# Patient Record
Sex: Male | Born: 1935 | Race: White | Hispanic: No | Marital: Married | Smoking: Former smoker
Health system: Southern US, Community
[De-identification: ages and names within clinical notes are randomized; demographics above are authoritative.]

## PROBLEM LIST (undated history)

## (undated) DIAGNOSIS — Z923 Personal history of irradiation: Secondary | ICD-10-CM

## (undated) DIAGNOSIS — C7951 Secondary malignant neoplasm of bone: Secondary | ICD-10-CM

## (undated) DIAGNOSIS — D649 Anemia, unspecified: Secondary | ICD-10-CM

## (undated) DIAGNOSIS — J189 Pneumonia, unspecified organism: Secondary | ICD-10-CM

## (undated) DIAGNOSIS — C801 Malignant (primary) neoplasm, unspecified: Secondary | ICD-10-CM

## (undated) DIAGNOSIS — I10 Essential (primary) hypertension: Secondary | ICD-10-CM

## (undated) DIAGNOSIS — C3490 Malignant neoplasm of unspecified part of unspecified bronchus or lung: Secondary | ICD-10-CM

## (undated) DIAGNOSIS — Z87442 Personal history of urinary calculi: Secondary | ICD-10-CM

## (undated) DIAGNOSIS — R0602 Shortness of breath: Secondary | ICD-10-CM

## (undated) DIAGNOSIS — J019 Acute sinusitis, unspecified: Secondary | ICD-10-CM

## (undated) DIAGNOSIS — I714 Abdominal aortic aneurysm, without rupture, unspecified: Secondary | ICD-10-CM

## (undated) DIAGNOSIS — J449 Chronic obstructive pulmonary disease, unspecified: Secondary | ICD-10-CM

## (undated) DIAGNOSIS — C140 Malignant neoplasm of pharynx, unspecified: Secondary | ICD-10-CM

## (undated) DIAGNOSIS — J40 Bronchitis, not specified as acute or chronic: Secondary | ICD-10-CM

## (undated) DIAGNOSIS — E1110 Type 2 diabetes mellitus with ketoacidosis without coma: Secondary | ICD-10-CM

## (undated) HISTORY — PX: OTHER SURGICAL HISTORY: SHX169

## (undated) HISTORY — PX: BRONCHOSCOPY: SUR163

## (undated) HISTORY — DX: Type 2 diabetes mellitus with ketoacidosis without coma: E11.10

## (undated) HISTORY — DX: Secondary malignant neoplasm of bone: C79.51

## (undated) HISTORY — DX: Abdominal aortic aneurysm, without rupture: I71.4

## (undated) HISTORY — DX: Bronchitis, not specified as acute or chronic: J40

## (undated) HISTORY — PX: COLON SURGERY: SHX602

## (undated) HISTORY — DX: Personal history of irradiation: Z92.3

## (undated) HISTORY — DX: Abdominal aortic aneurysm, without rupture, unspecified: I71.40

## (undated) HISTORY — PX: CATARACT EXTRACTION, BILATERAL: SHX1313

## (undated) HISTORY — PX: SCROTUM EXPLORATION: SHX2389

## (undated) HISTORY — DX: Chronic obstructive pulmonary disease, unspecified: J44.9

---

## 2004-07-12 HISTORY — PX: OTHER SURGICAL HISTORY: SHX169

## 2007-07-24 LAB — PULMONARY FUNCTION TEST

## 2007-07-28 ENCOUNTER — Ambulatory Visit: Payer: Self-pay | Admitting: Cardiology

## 2008-09-05 ENCOUNTER — Ambulatory Visit (HOSPITAL_COMMUNITY): Admission: RE | Admit: 2008-09-05 | Discharge: 2008-09-05 | Payer: Self-pay | Admitting: Ophthalmology

## 2008-10-09 ENCOUNTER — Ambulatory Visit (HOSPITAL_COMMUNITY): Admission: RE | Admit: 2008-10-09 | Discharge: 2008-10-09 | Payer: Self-pay | Admitting: Ophthalmology

## 2008-10-30 ENCOUNTER — Ambulatory Visit (HOSPITAL_COMMUNITY): Admission: RE | Admit: 2008-10-30 | Discharge: 2008-10-30 | Payer: Self-pay | Admitting: Ophthalmology

## 2010-10-21 LAB — GLUCOSE, CAPILLARY: Glucose-Capillary: 153 mg/dL — ABNORMAL HIGH (ref 70–99)

## 2010-10-27 LAB — HEMOGLOBIN AND HEMATOCRIT, BLOOD
HCT: 42.1 % (ref 39.0–52.0)
Hemoglobin: 14.2 g/dL (ref 13.0–17.0)

## 2010-10-27 LAB — BASIC METABOLIC PANEL
CO2: 25 mEq/L (ref 19–32)
Chloride: 99 mEq/L (ref 96–112)
Creatinine, Ser: 0.74 mg/dL (ref 0.4–1.5)
GFR calc Af Amer: >60 mL/min (ref >60)
Potassium: 4.1 mEq/L (ref 3.5–5.1)

## 2012-08-15 ENCOUNTER — Other Ambulatory Visit: Payer: Self-pay | Admitting: Family Medicine

## 2012-08-15 DIAGNOSIS — R091 Pleurisy: Secondary | ICD-10-CM

## 2012-08-18 ENCOUNTER — Encounter (HOSPITAL_COMMUNITY): Payer: Self-pay

## 2012-08-18 ENCOUNTER — Ambulatory Visit (HOSPITAL_COMMUNITY): Payer: Self-pay

## 2012-08-18 ENCOUNTER — Ambulatory Visit (HOSPITAL_COMMUNITY)
Admission: RE | Admit: 2012-08-18 | Discharge: 2012-08-18 | Disposition: A | Discharge status: Home / Self Care | Payer: Medicare Other | Source: Ambulatory Visit | Attending: Family Medicine | Admitting: Family Medicine

## 2012-08-18 DIAGNOSIS — R079 Chest pain, unspecified: Secondary | ICD-10-CM | POA: Insufficient documentation

## 2012-08-18 DIAGNOSIS — R599 Enlarged lymph nodes, unspecified: Secondary | ICD-10-CM | POA: Insufficient documentation

## 2012-08-18 DIAGNOSIS — R091 Pleurisy: Secondary | ICD-10-CM

## 2012-08-18 DIAGNOSIS — J984 Other disorders of lung: Secondary | ICD-10-CM | POA: Insufficient documentation

## 2012-09-04 ENCOUNTER — Other Ambulatory Visit (HOSPITAL_COMMUNITY): Payer: Self-pay | Admitting: Gastroenterology

## 2012-09-04 DIAGNOSIS — C189 Malignant neoplasm of colon, unspecified: Secondary | ICD-10-CM

## 2012-09-05 ENCOUNTER — Ambulatory Visit (HOSPITAL_COMMUNITY)
Admission: RE | Admit: 2012-09-05 | Discharge: 2012-09-05 | Disposition: A | Discharge status: Home / Self Care | Payer: Medicare Other | Source: Ambulatory Visit | Attending: Gastroenterology | Admitting: Gastroenterology

## 2012-09-05 DIAGNOSIS — I714 Abdominal aortic aneurysm, without rupture, unspecified: Secondary | ICD-10-CM | POA: Insufficient documentation

## 2012-09-05 DIAGNOSIS — C189 Malignant neoplasm of colon, unspecified: Secondary | ICD-10-CM | POA: Insufficient documentation

## 2012-09-05 MED ORDER — IOHEXOL 300 MG/ML  SOLN
100.0000 mL | Freq: Once | INTRAMUSCULAR | Status: AC | PRN
  Administered 2012-09-05: 100 mL via INTRAVENOUS

## 2012-09-11 ENCOUNTER — Ambulatory Visit (INDEPENDENT_AMBULATORY_CARE_PROVIDER_SITE_OTHER): Payer: Self-pay | Admitting: General Surgery

## 2012-09-18 ENCOUNTER — Encounter (INDEPENDENT_AMBULATORY_CARE_PROVIDER_SITE_OTHER): Payer: Self-pay | Admitting: General Surgery

## 2012-09-18 ENCOUNTER — Ambulatory Visit (INDEPENDENT_AMBULATORY_CARE_PROVIDER_SITE_OTHER): Payer: Medicare Other | Admitting: General Surgery

## 2012-09-18 VITALS — BP 142/64 | HR 92 | Temp 98.1°F | Resp 16 | Ht 68.0 in | Wt 179.0 lb

## 2012-09-18 DIAGNOSIS — D378 Neoplasm of uncertain behavior of other specified digestive organs: Secondary | ICD-10-CM

## 2012-09-18 DIAGNOSIS — D371 Neoplasm of uncertain behavior of stomach: Secondary | ICD-10-CM

## 2012-09-18 DIAGNOSIS — C189 Malignant neoplasm of colon, unspecified: Secondary | ICD-10-CM | POA: Insufficient documentation

## 2012-09-18 DIAGNOSIS — J449 Chronic obstructive pulmonary disease, unspecified: Secondary | ICD-10-CM

## 2012-09-18 DIAGNOSIS — E1110 Type 2 diabetes mellitus with ketoacidosis without coma: Secondary | ICD-10-CM

## 2012-09-18 DIAGNOSIS — I714 Abdominal aortic aneurysm, without rupture: Secondary | ICD-10-CM

## 2012-09-18 DIAGNOSIS — E1310 Other specified diabetes mellitus with ketoacidosis without coma: Secondary | ICD-10-CM

## 2012-09-18 HISTORY — DX: Type 2 diabetes mellitus with ketoacidosis without coma: E11.10

## 2012-09-18 NOTE — Patient Instructions (Signed)
[facility]  ONE-DAY (1) PRE-OP HOME COLON PREP INSTRUCTIONS: ** MIRALAX / GATORADE PREP **  Fill the two prescriptions at a pharmacy of your choice.  You must follow the instructions below carefully.  If you have questions or problems, please call and speak to someone in the clinic department at our office:   [phone].  MIRALAX - GATORADE -- DULCOLAX TABS:   Fill the prescriptions for MIRALAX  (255 gm bottle)    In addition, purchase four (4) DULCOLAX TABLETS (no prescription required), and one 64 oz GATORADE.  (Do NOT purchase red Gatorade; any other flavor is acceptable).  ANITIBIOTICS:   There will be 2 different antibiotics.     Take both prescriptions THE AFTERNOON BEFORE your surgery, at the times written on the bottles.  INSTRUCTIONS: 1. Five days prior to your procedure do not eat nuts, popcorn, or fruit with seeds.  Stop all fiber supplements such as Metamucil, Citrucel, etc.  2. The day before your procedure: o 6:00am:  take (4) Dulcolax tablets.  You should remain on clear liquids for the entire day.   CLEAR LIQUIDS: clear bouillon, broth, jello (NOT RED), black coffee, tea, soda, etc o 10:00am:  add the bottle of MiraLax to the 64-oz bottle of Gatorade, and dissolve.  Begin drinking the Gatorade mixture until gone (8 oz every 15-30 minutes).  Continue clear liquids until midnight (or bedtime). o Take the antibiotics at the times instructed on the bottles.  3. The day of your procedure:   Do not eat or drink ANYTHING after midnight before your surgery.     If you take Heart or Blood Pressure medicine, ask the pre-op nurses about these during your preop appointment.   Further pre-operative instructions will be given to you from the hospital.   Expect to be contacted 5-7 days before your surgery.       

## 2012-09-18 NOTE — Progress Notes (Signed)
Patient ID: Alan [name], male   DOB: 18-Oct-1931, 79 y.o.   MRN: [id number]  Chief Complaint  Patient presents with  . New Evaluation    eval cecal ca    HPI Alan [name] is a 79 y.o. male.   HPI  He is referred by Dr. Ewing Schlein for further evaluation and treatment of a highly dysplastic cecal neoplasm.  He was noted to have occult blood in his stool. He subsequently underwent a colonoscopy which demonstrated multiple polyps. The descending colon and sigmoid colon polyps were benign adenomatous polyps. The cecal polyp was large and grossly suspicious for malignancy. Biopsy demonstrated high-grade glandular dysplasia. He underwent a CT scan of the abdomen and pelvis and no metastatic disease disease was noted. The cecal tumor was large and noted on CT scan. Some thickening of the descending colon was noted. Also noted was a 6 cm x 5.5 cm infrarenal abdominal aortic aneurysm. A chest CT had been done in early February which demonstrated a left-sided rib fracture (he was having left chest wall pain) as well as some mediastinal adenopathy and stable pulmonary nodules.There is no family history of colon cancer. He denies constipation.  He is here with his wife.  Past Medical History  Diagnosis Date  . DM (diabetes mellitus) type 2 09/18/2012   COPD- on home oxygen at night Acute bronchitis Hyperlipidemia Scrotal abscess Anemia Left rib fracture Pulmonary nodules Sleep apnea BPH   Past Surgical History  Procedure Laterality Date  . Scrotum exploration    Drainage of peritonsillar abscess  History reviewed. No pertinent family history.  Social History History  Substance Use Topics  . Smoking status: Former Smoker    Types: Cigarettes  . Smokeless tobacco: Never Used  . Alcohol Use: No    Allergies  Allergen Reactions  . Sulfa Antibiotics   . Amoxicillin Rash    Current Outpatient Prescriptions  Medication Sig Dispense Refill  . albuterol (PROVENTIL) (2.5 MG/3ML) 0.083%  nebulizer solution       . amoxicillin (AMOXIL) 875 MG tablet       . atorvastatin (LIPITOR) 20 MG tablet       . fluticasone (FLONASE) 50 MCG/ACT nasal spray       . lisinopril (PRINIVIL,ZESTRIL) 5 MG tablet       . metFORMIN (GLUCOPHAGE) 500 MG tablet       . ONE TOUCH ULTRA TEST test strip       . SPIRIVA HANDIHALER 18 MCG inhalation capsule       . SYMBICORT 160-4.5 MCG/ACT inhaler       . tamsulosin (FLOMAX) 0.4 MG CAPS        No current facility-administered medications for this visit.    Review of Systems Review of Systems  Constitutional:       Weight loss.  Respiratory: Positive for cough and shortness of breath (with activity).   Cardiovascular: Negative.   Gastrointestinal: Negative.   Genitourinary: Negative.   Hematological: Negative.     Blood pressure 142/64, pulse 92, temperature 98.1 F (36.7 C), temperature source Temporal, resp. rate 16, height 5\' 8"  (1.727 m), weight 179 lb (81.194 kg).  Physical Exam Physical Exam  Constitutional:  Overweight male.  HENT:  Head: Normocephalic and atraumatic.  Eyes: EOM are normal. No scleral icterus.  Neck: Neck supple.  Cardiovascular: Normal rate and regular rhythm.   Pulmonary/Chest:  Very distant breath sounds. No wheezing.  Abdominal: Soft. He exhibits no distension and no mass. There is no tenderness.  Musculoskeletal: He exhibits no edema.  Lymphadenopathy:    He has no cervical adenopathy.  Neurological: He is alert.  Skin: Skin is warm and dry. Rash noted. Erythema: Papular rashon the trunk and upper arms.  Psychiatric: He has a normal mood and affect. His behavior is normal.    Data Reviewed Colonoscopy report. Pathology report. Notes from Dr. Ewing Schlein.  CT scans and reports.  Assessment    1. 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 This is causing occult blood loss and anemia.  2. 6 cm x 5.5 cm abdominal aortic aneurysm that is a new diagnosis.  This is been discussed with  one of the vascular surgeons-Dr. Myra Gianotti.  3. COPD and sleep apnea-uses oxygen at night. Limitation of activity due to the COPD. He is able to climb 14 steps without significant shortness of breath.  4. Type 2 diabetes mellitus. Well-controlled.  5.  New rash. Appears to be a drug-induced rash possibly secondary to amoxicillin. I have told him to stop the amoxicillin and call Dr. Modesto Charon about this.    Plan    Given the fact that this tumor is occultly bleeding causing anemia I recommended proceeding with a laparoscopic assisted partial colectomy. Once he gets over this, he may need attention to the abdominal aortic aneurysm. I am going to make a referral to Dr. Myra Gianotti for further evaluation and potential treatment of this in the future. I explained to him that the rupture rate of this aneurysm was 10-15% over the next year.  I have explained the procedure and risks of colon resection.  Risks include but are not limited to bleeding, infection, wound problems, anesthesia, anastomotic leak, need for colostomy, injury to intraabominal organs (such as intestine, spleen, kidney, bladder, ureter, etc.), ileus, irregular bowel habits. The big issue could be with his lungs and he understands this. They seem to understand and agree to proceed.       Thanos Cousineau J 09/18/2012, 3:43 PM

## 2012-09-19 ENCOUNTER — Encounter (INDEPENDENT_AMBULATORY_CARE_PROVIDER_SITE_OTHER): Payer: Self-pay

## 2012-09-20 ENCOUNTER — Telehealth (INDEPENDENT_AMBULATORY_CARE_PROVIDER_SITE_OTHER): Payer: Self-pay | Admitting: General Surgery

## 2012-09-20 NOTE — Telephone Encounter (Signed)
Alan [name], as [facility], called to let Dr. Abbey Chatters know the referral sent has been appointed to Dr. Myra Gianotti on Friday, September 22, 2012, at 10:00 am.  She has left message for the pt, but has not made direct contact with him.  There is no other person or phone number in his [facility] chart to provide to reach him.  She will continue to try to reach him or his wife.

## 2012-09-20 NOTE — Telephone Encounter (Signed)
Juliette Alcide called back to acknowledge speaking to the pt to confirm his appt with [facility] on Friday.

## 2012-09-21 ENCOUNTER — Encounter: Payer: Self-pay | Admitting: Vascular Surgery

## 2012-09-22 ENCOUNTER — Ambulatory Visit (INDEPENDENT_AMBULATORY_CARE_PROVIDER_SITE_OTHER): Payer: Medicare Other | Admitting: Surgery

## 2012-09-22 ENCOUNTER — Encounter: Payer: Self-pay | Admitting: Surgery

## 2012-09-22 VITALS — BP 155/79 | HR 81 | Resp 18 | Ht 68.0 in | Wt 178.2 lb

## 2012-09-22 DIAGNOSIS — I714 Abdominal aortic aneurysm, without rupture, unspecified: Secondary | ICD-10-CM | POA: Insufficient documentation

## 2012-09-22 DIAGNOSIS — I739 Peripheral vascular disease, unspecified: Secondary | ICD-10-CM

## 2012-09-22 DIAGNOSIS — Z01810 Encounter for preprocedural cardiovascular examination: Secondary | ICD-10-CM

## 2012-09-22 NOTE — Addendum Note (Signed)
Addended by: Sharee Pimple on: 09/22/2012 11:15 AM   Modules accepted: Orders

## 2012-09-22 NOTE — Progress Notes (Signed)
[facility]   Patient name: BOWEN KIA MRN: [id number] DOB: 03-11-1932 Sex: male   Referred by: Dr. Abbey Chatters  Reason for referral:  Chief Complaint  Patient presents with  . New Evaluation    referred by Dr. Avel Peace  . AAA    HISTORY OF PRESENT ILLNESS: This is a very pleasant 79 year old gentleman that I had seen for evaluation of an abdominal aortic aneurysm. His aneurysm was recently detected during a workup for heme positive stools. He was diagnosed with a cecal mass. A CT scan showed a 6 cm abdominal aortic aneurysm. The patient's most recent hemoglobin was 9. The patient was recently diagnosed with diabetes back in November. He does not know what his A1c is. He has a history of smoking but does not smoke currently. He is treated for his COPD with a Spiriva as well as home oxygen. The patient states that he has cholesterol issues but is not currently taking a statin.  Past Medical History  Diagnosis Date  . DM (diabetes mellitus) type 2 09/18/2012  . AAA (abdominal aortic aneurysm)   . COPD (chronic obstructive pulmonary disease)   . Bronchitis     Past Surgical History  Procedure Laterality Date  . Scrotum exploration    . Removal of abcess  2006    pt. states abcess was located behind his tonsils    History   Social History  . Marital Status: Married    Spouse Name: N/A    Number of Children: N/A  . Years of Education: N/A   Occupational History  . Not on file.   Social History Main Topics  . Smoking status: Former Smoker    Types: Cigarettes    Quit date: 07/12/2001  . Smokeless tobacco: Never Used  . Alcohol Use: No  . Drug Use: No  . Sexually Active: Not on file   Other Topics Concern  . Not on file   Social History Narrative  . No narrative on file    Family History  Problem Relation Age of Onset  . Heart disease Father     Allergies as of 09/22/2012 - Review Complete 09/22/2012  Allergen  Reaction Noted  . Sulfa antibiotics  09/05/2012  . Amoxicillin Rash 09/18/2012    Current Outpatient Prescriptions on File Prior to Visit  Medication Sig Dispense Refill  . albuterol (PROVENTIL) (2.5 MG/3ML) 0.083% nebulizer solution       . atorvastatin (LIPITOR) 20 MG tablet       . fluticasone (FLONASE) 50 MCG/ACT nasal spray       . lisinopril (PRINIVIL,ZESTRIL) 5 MG tablet       . metFORMIN (GLUCOPHAGE) 500 MG tablet       . ONE TOUCH ULTRA TEST test strip       . SPIRIVA HANDIHALER 18 MCG inhalation capsule       . SYMBICORT 160-4.5 MCG/ACT inhaler       . tamsulosin (FLOMAX) 0.4 MG CAPS       . amoxicillin (AMOXIL) 875 MG tablet        No current facility-administered medications on file prior to visit.     REVIEW OF SYSTEMS: Cardiovascular: No chest pain, chest pressure, palpitations, orthopnea, or dyspnea on exertion. No claudication or rest pain,  No history of DVT or phlebitis. Pulmonary: Positive for home oxygen and shortness of breath with exertion.  Neurologic: No weakness, paresthesias, aphasia, or amaurosis. No dizziness. Hematologic: No bleeding problems or clotting disorders.  Musculoskeletal: No joint pain or joint swelling. Gastrointestinal: No blood in stool or hematemesis Genitourinary: No dysuria or hematuria. Psychiatric:: No history of major depression. Integumentary: No rashes or ulcers. Constitutional: No fever or chills.  PHYSICAL EXAMINATION: General: The patient appears their stated age.  Vital signs are BP 155/79  Pulse 81  Resp 18  Ht 5\' 8"  (1.727 m)  Wt 178 lb 3.2 oz (80.831 kg)  BMI 27.1 kg/m2 HEENT:  No gross abnormalities Pulmonary: Respirations are non-labored Abdomen: Soft and non-tender aorta is nontender Musculoskeletal: There are no major deformities.   Neurologic: No focal weakness or paresthesias are detected, Skin: There are no ulcer or rashes noted. Psychiatric: The patient has normal affect. Cardiovascular: There is a  regular rate and rhythm without significant murmur appreciated. No carotid bruits. Palpable pedal pulses.  Diagnostic Studies: None today  Outside Studies/Documentation Historical records were reviewed.  They showed cecal mass and 6 cm abdominal aortic aneurysm  Medication Changes: None, however I have requested that the patient began statin therapy for his hypercholesterolemia  Assessment:  Abdominal aortic aneurysm Plan: I have discussed this predicament with Dr. Abbey Chatters. The patient has both a cecal cancer and a large abdominal aortic aneurysm. Because the patient is symptomatic from his cancer, he is having heme positive stools with a hemoglobin decrease down to 9, I feel that the cancer needs to be addressed first. Once the patient has recovered from his cancer operation, and he will need to have repair of his abdominal aortic aneurysm. By my evaluation of his 5 mm cut CT scan, the patient appears to have about a 10 mm infrarenal aortic neck. I would like to treat him with a branched device, which would not be available for 2-3 months. This will probably be about a time the patient has recovered from his bowel resection. I discussed placing a branched/fenestrated graft with the patient and his wife today. I am ordering a 1 mm cut CT angiogram of the chest abdomen and pelvis today. He will also need preoperative carotid and lower extremity Doppler studies. I have scheduled him to come back and see me in the office on April 21. Roughly at that time he will be well on his way from recovering from his colon resection and we can talk about proceeding with aneurysm repair. I did state that he is at risk for rupture of his aneurysm while he is dealing with his colon cancer, however I feel that the colon cancer needs to be addressed first. They understand that. They have been instructed on what to do should he develop severe abdominal or back pain.     Jorge Ny, M.D. [facility] [place] Office: [phone] Pager:  [phone]

## 2012-09-26 ENCOUNTER — Ambulatory Visit
Admission: RE | Admit: 2012-09-26 | Discharge: 2012-09-26 | Disposition: A | Discharge status: Home / Self Care | Payer: Medicare Other | Source: Ambulatory Visit | Attending: Surgery | Admitting: Surgery

## 2012-09-26 DIAGNOSIS — I714 Abdominal aortic aneurysm, without rupture: Secondary | ICD-10-CM

## 2012-09-26 DIAGNOSIS — Z01810 Encounter for preprocedural cardiovascular examination: Secondary | ICD-10-CM

## 2012-09-26 MED ORDER — IOHEXOL 350 MG/ML SOLN
100.0000 mL | Freq: Once | INTRAVENOUS | Status: AC | PRN
  Administered 2012-09-26: 100 mL via INTRAVENOUS

## 2012-09-27 ENCOUNTER — Encounter (INDEPENDENT_AMBULATORY_CARE_PROVIDER_SITE_OTHER): Payer: Self-pay

## 2012-09-29 ENCOUNTER — Encounter (HOSPITAL_COMMUNITY): Payer: Self-pay | Admitting: Pharmacy Technician

## 2012-10-02 ENCOUNTER — Telehealth: Payer: Self-pay | Admitting: Family Medicine

## 2012-10-02 NOTE — Telephone Encounter (Signed)
The patient had requested antibiotics for a sinus infection. He was treated with amoxicillin. He broke out in a rash. Went to [place] to urgent care to be evaluated and treated. Rash is getting better. But his head is very congested and he is blowing out a lot of thick yellow mucus out of his nose. Advised him to make an appointment  in the next couple of days for me to evaluate him.

## 2012-10-02 NOTE — Telephone Encounter (Signed)
Amoxicillin broke him out. Needs a different med called to [facility]

## 2012-10-03 ENCOUNTER — Encounter: Payer: Self-pay | Admitting: Family Medicine

## 2012-10-03 ENCOUNTER — Ambulatory Visit (INDEPENDENT_AMBULATORY_CARE_PROVIDER_SITE_OTHER): Payer: Medicare Other | Admitting: Family Medicine

## 2012-10-03 DIAGNOSIS — J310 Chronic rhinitis: Secondary | ICD-10-CM

## 2012-10-03 DIAGNOSIS — L239 Allergic contact dermatitis, unspecified cause: Secondary | ICD-10-CM

## 2012-10-03 DIAGNOSIS — L259 Unspecified contact dermatitis, unspecified cause: Secondary | ICD-10-CM

## 2012-10-03 DIAGNOSIS — K6389 Other specified diseases of intestine: Secondary | ICD-10-CM

## 2012-10-03 DIAGNOSIS — I714 Abdominal aortic aneurysm, without rupture, unspecified: Secondary | ICD-10-CM

## 2012-10-03 DIAGNOSIS — J019 Acute sinusitis, unspecified: Secondary | ICD-10-CM

## 2012-10-03 MED ORDER — DOXYCYCLINE HYCLATE 100 MG PO TABS: 100.0000 mg | ORAL_TABLET | Freq: Two times a day (BID) | ORAL | Status: DC

## 2012-10-03 NOTE — Telephone Encounter (Signed)
Pt aware and has appt today with dr Modesto Charon

## 2012-10-03 NOTE — Progress Notes (Signed)
Subjective:     Patient ID: Alan [name], male   DOB: 08-02-1931, 79 y.o.   MRN: [id number]  HPI Patient had anemia. Further workup revealed a colonic mass/malignancy. And also finding of AAA.  He scheduled for surgery in 8 days. He had acute sinusitis recently. Treated with amoxicillin. Allergic reaction. Reaction was manifested by eruption of a rash. This was discontinued and treated at urgent care. Whenever he still has pain in his cheek bones. And he is blowing out a lot of thick yellow phlegm out of his nose. And congested in his head. He would like to clear it up so he can move ahead with surgery. He has had to postpone surgery already. No fever. No chills. No shortness of breath.  Past Medical History  Diagnosis Date  . DM (diabetes mellitus) type 2 09/18/2012  . AAA (abdominal aortic aneurysm)   . COPD (chronic obstructive pulmonary disease)   . Bronchitis    Past Surgical History  Procedure Laterality Date  . Scrotum exploration    . Removal of abcess  2006    pt. states abcess was located behind his tonsils   History   Social History  . Marital Status: Married    Spouse Name: N/A    Number of Children: N/A  . Years of Education: N/A   Occupational History  . Not on file.   Social History Main Topics  . Smoking status: Former Smoker    Types: Cigarettes    Quit date: 07/12/2001  . Smokeless tobacco: Never Used  . Alcohol Use: No  . Drug Use: No  . Sexually Active: Not on file   Other Topics Concern  . Not on file   Social History Narrative  . No narrative on file   Family History  Problem Relation Age of Onset  . Heart disease Father    Current Outpatient Prescriptions on File Prior to Visit  Medication Sig Dispense Refill  . albuterol (PROVENTIL) (2.5 MG/3ML) 0.083% nebulizer solution Take 2.5 mg by nebulization every 6 (six) hours as needed for wheezing or shortness of breath.       Marland Kitchen atorvastatin (LIPITOR) 20 MG tablet Take 20 mg by mouth every  morning.       . clobetasol ointment (TEMOVATE) 0.05 % Apply 1 application topically 2 (two) times daily. Apply to arms      . fluticasone (FLONASE) 50 MCG/ACT nasal spray Place 2 sprays into the nose daily.       Marland Kitchen ketoconazole (NIZORAL) 2 % cream Apply 1 application topically daily. Apply to face      . lisinopril (PRINIVIL,ZESTRIL) 5 MG tablet Take 5 mg by mouth every morning.       . loratadine (CLARITIN) 10 MG tablet Take 10 mg by mouth daily.      . metFORMIN (GLUCOPHAGE) 500 MG tablet Take 500 mg by mouth 2 (two) times daily with a meal.       . SPIRIVA HANDIHALER 18 MCG inhalation capsule Place 18 mcg into inhaler and inhale daily.       . SYMBICORT 160-4.5 MCG/ACT inhaler Inhale 2 puffs into the lungs 2 (two) times daily.       . tamsulosin (FLOMAX) 0.4 MG CAPS Take 0.4 mg by mouth daily.        No current facility-administered medications on file prior to visit.   Allergies  Allergen Reactions  . Sulfa Antibiotics Anaphylaxis  . Amoxicillin Rash    There is no immunization history  on file for this patient. Prior to Admission medications   Medication Sig Start Date End Date Taking? Authorizing Provider  albuterol (PROVENTIL) (2.5 MG/3ML) 0.083% nebulizer solution Take 2.5 mg by nebulization every 6 (six) hours as needed for wheezing or shortness of breath.  08/04/12  Yes Historical Provider, MD  atorvastatin (LIPITOR) 20 MG tablet Take 20 mg by mouth every morning.  09/04/12  Yes Historical Provider, MD  clobetasol ointment (TEMOVATE) 0.05 % Apply 1 application topically 2 (two) times daily. Apply to arms   Yes Historical Provider, MD  fluticasone (FLONASE) 50 MCG/ACT nasal spray Place 2 sprays into the nose daily.  08/28/12  Yes Historical Provider, MD  ketoconazole (NIZORAL) 2 % cream Apply 1 application topically daily. Apply to face   Yes Historical Provider, MD  lisinopril (PRINIVIL,ZESTRIL) 5 MG tablet Take 5 mg by mouth every morning.  09/02/12  Yes Historical Provider, MD   loratadine (CLARITIN) 10 MG tablet Take 10 mg by mouth daily.   Yes Historical Provider, MD  metFORMIN (GLUCOPHAGE) 500 MG tablet Take 500 mg by mouth 2 (two) times daily with a meal.  09/11/12  Yes Historical Provider, MD  SPIRIVA HANDIHALER 18 MCG inhalation capsule Place 18 mcg into inhaler and inhale daily.  08/22/12  Yes Historical Provider, MD  SYMBICORT 160-4.5 MCG/ACT inhaler Inhale 2 puffs into the lungs 2 (two) times daily.  08/28/12  Yes Historical Provider, MD  tamsulosin (FLOMAX) 0.4 MG CAPS Take 0.4 mg by mouth daily.  08/22/12  Yes Historical Provider, MD  doxycycline (VIBRA-TABS) 100 MG tablet Take 1 tablet (100 mg total) by mouth 2 (two) times daily. 10/03/12   Ileana Ladd, MD     Review of Systems No other symptoms today.     Objective:   Physical Exam On examination he appeared in no acute distress. Nasally congested There were no vitals taken for this visit.  Vital signs as documented.  Skin warm and dry and without overt rashes.  Head &Neck without JVD. Nasal coryza. In the maxillary sinus area. Frontal sinuses nontender. Throat postnasal drip. Neck supple. Rest of the head and neck Normal. Lungs clear. Coarse breath sounds not new Heart exam notable for regular rhythm, normal sounds and absence of murmurs, rubs or gallops.  Abdomen unremarkable and without evidence of organomegaly, masses, or abdominal aortic enlargement.  Extremities nonedematous. Neurologic: oriented to name, place, and time. Nonfocal exam.    Assessment:     Purulent rhinitis  Acute sinusitis  Allergic dermatitis  AAA (abdominal aortic aneurysm) without rupture  Colonic mass      Plan:         Prescribed for him doxycycline 100 mg twice a day for 10 days. He has had this in the last few months without any adverse events. Patient to use Afrin nasal spray for 2 days only. Patient to continue using Flonase nasal spray. Wish him well on his future surgery. No imaging was needed  today and no labs were needed either. Return to clinic when necessary  Adelena Desantiago P. Modesto Charon, M.D.

## 2012-10-04 NOTE — Patient Instructions (Addendum)
LENWARD ABLE  10/04/2012   Your procedure is scheduled on: 10/12/12   Report to [facility] at    0500 AM.  Call this number if you have problems the morning of surgery: [phone]   Remember:   Do not eat food or drink liquids after midnight.   Take these medicines the morning of surgery with A SIP OF WATER:    Do not wear jewelry,   Do not wear lotions, powders, or perfumes.  . Men may shave face and neck.  Do not bring valuables to the hospital.  Contacts, dentures or bridgework may not be worn into surgery.  Leave suitcase in the car. After surgery it may be brought to your room.  For patients admitted to the hospital, checkout time is 11:00 AM the day of  discharge.  .   SEE CHG INSTRUCTION SHEET    Please read over the following fact sheets that you were given: MRSA Information, coughing and deep breathing exercises, leg exercises, Blood Transfusion Fact sheet                Failure to comply with these instructions may result in cancellation of your surgery.                Patient Signature ____________________________              Nurse Signature _____________________________

## 2012-10-05 ENCOUNTER — Encounter (HOSPITAL_COMMUNITY): Payer: Self-pay

## 2012-10-05 ENCOUNTER — Encounter (HOSPITAL_COMMUNITY)
Admission: RE | Admit: 2012-10-05 | Discharge: 2012-10-05 | Disposition: A | Discharge status: Home / Self Care | Payer: Medicare Other | Source: Ambulatory Visit | Attending: General Surgery | Admitting: General Surgery

## 2012-10-05 HISTORY — DX: Acute sinusitis, unspecified: J01.90

## 2012-10-05 HISTORY — DX: Shortness of breath: R06.02

## 2012-10-05 HISTORY — DX: Essential (primary) hypertension: I10

## 2012-10-05 LAB — CBC WITH DIFFERENTIAL/PLATELET
Hemoglobin: 10.3 g/dL — ABNORMAL LOW (ref 13.0–17.0)
Lymphocytes Relative: 15 % (ref 12–46)
Lymphs Abs: 1.4 10*3/uL (ref 0.7–4.0)
MCH: 28.9 pg (ref 26.0–34.0)
Monocytes Relative: 6 % (ref 3–12)
Neutro Abs: 7 10*3/uL (ref 1.7–7.7)
Neutrophils Relative %: 77 % (ref 43–77)
Platelets: 299 10*3/uL (ref 150–400)
RBC: 3.57 MIL/uL — ABNORMAL LOW (ref 4.22–5.81)
WBC: 9.1 10*3/uL (ref 4.0–10.5)

## 2012-10-05 LAB — SURGICAL PCR SCREEN: Staphylococcus aureus: NEGATIVE

## 2012-10-05 LAB — COMPREHENSIVE METABOLIC PANEL
ALT: 6 U/L (ref 0–53)
Alkaline Phosphatase: 54 U/L (ref 39–117)
BUN: 11 mg/dL (ref 6–23)
CO2: 28 mEq/L (ref 19–32)
Chloride: 97 mEq/L (ref 96–112)
GFR calc Af Amer: >90 mL/min (ref >90)
Glucose, Bld: 117 mg/dL — ABNORMAL HIGH (ref 70–99)
Potassium: 4 mEq/L (ref 3.5–5.1)
Sodium: 134 mEq/L — ABNORMAL LOW (ref 135–145)
Total Bilirubin: 0.3 mg/dL (ref 0.3–1.2)
Total Protein: 7.7 g/dL (ref 6.0–8.3)

## 2012-10-05 LAB — PROTIME-INR: Prothrombin Time: 14.4 seconds (ref 11.6–15.2)

## 2012-10-05 LAB — ABO/RH: ABO/RH(D): A POS

## 2012-10-05 NOTE — Progress Notes (Signed)
Patient has instructions from [facility] regarding bowel prep.

## 2012-10-05 NOTE — Progress Notes (Addendum)
LOV with Dr Modesto Charon 10/03/12 in [facility]  LOV with Dr Myra Gianotti on 09/22/12 in [facility]

## 2012-10-11 ENCOUNTER — Encounter (INDEPENDENT_AMBULATORY_CARE_PROVIDER_SITE_OTHER): Payer: Self-pay

## 2012-10-12 ENCOUNTER — Encounter (HOSPITAL_COMMUNITY): Payer: Self-pay | Admitting: *Deleted

## 2012-10-12 ENCOUNTER — Inpatient Hospital Stay (HOSPITAL_COMMUNITY)
Admission: RE | Admit: 2012-10-12 | Discharge: 2012-10-19 | DRG: 330 | Disposition: A | Discharge status: Home / Self Care | Payer: Medicare Other | Source: Ambulatory Visit | Attending: General Surgery | Admitting: General Surgery

## 2012-10-12 ENCOUNTER — Encounter (HOSPITAL_COMMUNITY)
Admission: RE | Disposition: A | Discharge status: Home / Self Care | Payer: Self-pay | Source: Ambulatory Visit | Attending: General Surgery

## 2012-10-12 ENCOUNTER — Inpatient Hospital Stay (HOSPITAL_COMMUNITY): Payer: Medicare Other | Admitting: *Deleted

## 2012-10-12 DIAGNOSIS — D500 Iron deficiency anemia secondary to blood loss (chronic): Secondary | ICD-10-CM | POA: Diagnosis present

## 2012-10-12 DIAGNOSIS — E1110 Type 2 diabetes mellitus with ketoacidosis without coma: Secondary | ICD-10-CM | POA: Diagnosis present

## 2012-10-12 DIAGNOSIS — T360X5A Adverse effect of penicillins, initial encounter: Secondary | ICD-10-CM | POA: Diagnosis present

## 2012-10-12 DIAGNOSIS — C180 Malignant neoplasm of cecum: Principal | ICD-10-CM | POA: Diagnosis present

## 2012-10-12 DIAGNOSIS — N400 Enlarged prostate without lower urinary tract symptoms: Secondary | ICD-10-CM | POA: Diagnosis present

## 2012-10-12 DIAGNOSIS — J4489 Other specified chronic obstructive pulmonary disease: Secondary | ICD-10-CM | POA: Diagnosis present

## 2012-10-12 DIAGNOSIS — R195 Other fecal abnormalities: Secondary | ICD-10-CM | POA: Diagnosis present

## 2012-10-12 DIAGNOSIS — C189 Malignant neoplasm of colon, unspecified: Secondary | ICD-10-CM

## 2012-10-12 DIAGNOSIS — D62 Acute posthemorrhagic anemia: Secondary | ICD-10-CM | POA: Diagnosis not present

## 2012-10-12 DIAGNOSIS — I714 Abdominal aortic aneurysm, without rupture, unspecified: Secondary | ICD-10-CM | POA: Diagnosis present

## 2012-10-12 DIAGNOSIS — I10 Essential (primary) hypertension: Secondary | ICD-10-CM

## 2012-10-12 DIAGNOSIS — J449 Chronic obstructive pulmonary disease, unspecified: Secondary | ICD-10-CM | POA: Diagnosis present

## 2012-10-12 DIAGNOSIS — L270 Generalized skin eruption due to drugs and medicaments taken internally: Secondary | ICD-10-CM | POA: Diagnosis present

## 2012-10-12 DIAGNOSIS — E119 Type 2 diabetes mellitus without complications: Secondary | ICD-10-CM | POA: Diagnosis present

## 2012-10-12 DIAGNOSIS — E785 Hyperlipidemia, unspecified: Secondary | ICD-10-CM | POA: Diagnosis present

## 2012-10-12 DIAGNOSIS — IMO0002 Reserved for concepts with insufficient information to code with codable children: Secondary | ICD-10-CM | POA: Diagnosis not present

## 2012-10-12 DIAGNOSIS — G4730 Sleep apnea, unspecified: Secondary | ICD-10-CM | POA: Diagnosis present

## 2012-10-12 DIAGNOSIS — Z01812 Encounter for preprocedural laboratory examination: Secondary | ICD-10-CM

## 2012-10-12 DIAGNOSIS — Y838 Other surgical procedures as the cause of abnormal reaction of the patient, or of later complication, without mention of misadventure at the time of the procedure: Secondary | ICD-10-CM | POA: Diagnosis not present

## 2012-10-12 HISTORY — PX: LAPAROSCOPIC PARTIAL COLECTOMY: SHX5907

## 2012-10-12 LAB — GLUCOSE, CAPILLARY: Glucose-Capillary: 125 mg/dL — ABNORMAL HIGH (ref 70–99)

## 2012-10-12 LAB — TYPE AND SCREEN: Antibody Screen: NEGATIVE

## 2012-10-12 SURGERY — LAPAROSCOPIC PARTIAL COLECTOMY
Anesthesia: General | Site: Abdomen | Wound class: Clean Contaminated

## 2012-10-12 MED ORDER — ALBUTEROL SULFATE (5 MG/ML) 0.5% IN NEBU
2.5000 mg | INHALATION_SOLUTION | RESPIRATORY_TRACT | Status: DC | PRN
  Administered 2012-10-14 – 2012-10-17 (×5): 2.5 mg via RESPIRATORY_TRACT
  Filled 2012-10-12 (×5): qty 0.5

## 2012-10-12 MED ORDER — HEPARIN SODIUM (PORCINE) 5000 UNIT/ML IJ SOLN
5000.0000 [IU] | Freq: Three times a day (TID) | INTRAMUSCULAR | Status: DC
  Administered 2012-10-13 – 2012-10-17 (×13): 5000 [IU] via SUBCUTANEOUS
  Filled 2012-10-12 (×16): qty 1

## 2012-10-12 MED ORDER — DEXTROSE 5 % IV SOLN
2.0000 g | INTRAVENOUS | Status: AC
  Administered 2012-10-12: 2 g via INTRAVENOUS
  Filled 2012-10-12: qty 2

## 2012-10-12 MED ORDER — TIOTROPIUM BROMIDE MONOHYDRATE 18 MCG IN CAPS
18.0000 ug | ORAL_CAPSULE | Freq: Every day | RESPIRATORY_TRACT | Status: DC
  Administered 2012-10-13 – 2012-10-19 (×6): 18 ug via RESPIRATORY_TRACT
  Filled 2012-10-12 (×2): qty 5

## 2012-10-12 MED ORDER — LACTATED RINGERS IV SOLN
INTRAVENOUS | Status: DC | PRN
  Administered 2012-10-12: 1000 mL

## 2012-10-12 MED ORDER — PROMETHAZINE HCL 25 MG/ML IJ SOLN: 6.2500 mg | INTRAMUSCULAR | Status: DC | PRN

## 2012-10-12 MED ORDER — PROPOFOL 10 MG/ML IV EMUL
INTRAVENOUS | Status: DC | PRN
  Administered 2012-10-12: 130 mg via INTRAVENOUS

## 2012-10-12 MED ORDER — SUCCINYLCHOLINE CHLORIDE 20 MG/ML IJ SOLN
INTRAMUSCULAR | Status: DC | PRN
  Administered 2012-10-12: 100 mg via INTRAVENOUS

## 2012-10-12 MED ORDER — ACETAMINOPHEN 10 MG/ML IV SOLN
INTRAVENOUS | Status: DC | PRN
  Administered 2012-10-12: 1000 mg via INTRAVENOUS

## 2012-10-12 MED ORDER — ALBUTEROL SULFATE HFA 108 (90 BASE) MCG/ACT IN AERS
2.0000 | INHALATION_SPRAY | Freq: Four times a day (QID) | RESPIRATORY_TRACT | Status: DC | PRN
  Filled 2012-10-12: qty 6.7

## 2012-10-12 MED ORDER — KETOROLAC TROMETHAMINE 30 MG/ML IJ SOLN
15.0000 mg | Freq: Once | INTRAMUSCULAR | Status: AC | PRN
  Administered 2012-10-12: 15 mg via INTRAVENOUS

## 2012-10-12 MED ORDER — FENTANYL CITRATE 0.05 MG/ML IJ SOLN
25.0000 ug | INTRAMUSCULAR | Status: DC | PRN
  Administered 2012-10-12 (×4): 25 ug via INTRAVENOUS

## 2012-10-12 MED ORDER — ALVIMOPAN 12 MG PO CAPS
12.0000 mg | ORAL_CAPSULE | Freq: Once | ORAL | Status: AC
  Administered 2012-10-12: 12 mg via ORAL
  Filled 2012-10-12: qty 1

## 2012-10-12 MED ORDER — DEXTROSE 5 % IV SOLN
2.0000 g | Freq: Two times a day (BID) | INTRAVENOUS | Status: AC
  Administered 2012-10-12: 2 g via INTRAVENOUS
  Filled 2012-10-12: qty 2

## 2012-10-12 MED ORDER — METOCLOPRAMIDE HCL 5 MG/ML IJ SOLN
INTRAMUSCULAR | Status: DC | PRN
  Administered 2012-10-12: 5 mg via INTRAVENOUS

## 2012-10-12 MED ORDER — SODIUM CHLORIDE 0.9 % IV SOLN
INTRAVENOUS | Status: DC | PRN
  Administered 2012-10-12: 09:00:00 via INTRAVENOUS

## 2012-10-12 MED ORDER — LACTATED RINGERS IV SOLN
INTRAVENOUS | Status: DC | PRN
  Administered 2012-10-12 (×2): via INTRAVENOUS

## 2012-10-12 MED ORDER — EPHEDRINE SULFATE 50 MG/ML IJ SOLN
INTRAMUSCULAR | Status: DC | PRN
  Administered 2012-10-12 (×2): 5 mg via INTRAVENOUS

## 2012-10-12 MED ORDER — LIDOCAINE HCL (CARDIAC) 20 MG/ML IV SOLN
INTRAVENOUS | Status: DC | PRN
  Administered 2012-10-12: 50 mg via INTRAVENOUS

## 2012-10-12 MED ORDER — DEXAMETHASONE SODIUM PHOSPHATE 10 MG/ML IJ SOLN
INTRAMUSCULAR | Status: DC | PRN
  Administered 2012-10-12: 10 mg via INTRAVENOUS

## 2012-10-12 MED ORDER — FENTANYL CITRATE 0.05 MG/ML IJ SOLN
12.5000 ug | INTRAMUSCULAR | Status: DC | PRN
  Administered 2012-10-12 – 2012-10-13 (×5): 25 ug via INTRAVENOUS
  Filled 2012-10-12 (×5): qty 2

## 2012-10-12 MED ORDER — GLYCOPYRROLATE 0.2 MG/ML IJ SOLN
INTRAMUSCULAR | Status: DC | PRN
  Administered 2012-10-12: .1 mg via INTRAVENOUS

## 2012-10-12 MED ORDER — TAMSULOSIN HCL 0.4 MG PO CAPS
0.4000 mg | ORAL_CAPSULE | Freq: Every day | ORAL | Status: DC
  Administered 2012-10-13 – 2012-10-19 (×7): 0.4 mg via ORAL
  Filled 2012-10-12 (×7): qty 1

## 2012-10-12 MED ORDER — ONDANSETRON HCL 4 MG/2ML IJ SOLN
INTRAMUSCULAR | Status: DC | PRN
  Administered 2012-10-12 (×2): 2 mg via INTRAVENOUS

## 2012-10-12 MED ORDER — BUDESONIDE-FORMOTEROL FUMARATE 160-4.5 MCG/ACT IN AERO
2.0000 | INHALATION_SPRAY | Freq: Two times a day (BID) | RESPIRATORY_TRACT | Status: DC
  Administered 2012-10-12 – 2012-10-19 (×14): 2 via RESPIRATORY_TRACT
  Filled 2012-10-12: qty 6

## 2012-10-12 MED ORDER — ONDANSETRON HCL 4 MG PO TABS
4.0000 mg | ORAL_TABLET | Freq: Four times a day (QID) | ORAL | Status: DC | PRN
  Administered 2012-10-13: 4 mg via ORAL
  Filled 2012-10-12: qty 1

## 2012-10-12 MED ORDER — NEOSTIGMINE METHYLSULFATE 1 MG/ML IJ SOLN
INTRAMUSCULAR | Status: DC | PRN
  Administered 2012-10-12: 1 mg via INTRAVENOUS

## 2012-10-12 MED ORDER — KCL-LACTATED RINGERS-D5W 20 MEQ/L IV SOLN
INTRAVENOUS | Status: DC
  Administered 2012-10-12 – 2012-10-14 (×3): via INTRAVENOUS
  Filled 2012-10-12 (×5): qty 1000

## 2012-10-12 MED ORDER — PHENYLEPHRINE HCL 10 MG/ML IJ SOLN
INTRAMUSCULAR | Status: DC | PRN
  Administered 2012-10-12: 20 ug via INTRAVENOUS
  Administered 2012-10-12: 40 ug via INTRAVENOUS
  Administered 2012-10-12: 20 ug via INTRAVENOUS

## 2012-10-12 MED ORDER — 0.9 % SODIUM CHLORIDE (POUR BTL) OPTIME
TOPICAL | Status: DC | PRN
  Administered 2012-10-12: 2000 mL

## 2012-10-12 MED ORDER — ALVIMOPAN 12 MG PO CAPS
12.0000 mg | ORAL_CAPSULE | Freq: Two times a day (BID) | ORAL | Status: AC
  Administered 2012-10-13 – 2012-10-16 (×7): 12 mg via ORAL
  Filled 2012-10-12 (×8): qty 1

## 2012-10-12 MED ORDER — FENTANYL CITRATE 0.05 MG/ML IJ SOLN
INTRAMUSCULAR | Status: DC | PRN
  Administered 2012-10-12 (×3): 50 ug via INTRAVENOUS

## 2012-10-12 MED ORDER — BUPIVACAINE HCL (PF) 0.5 % IJ SOLN
INTRAMUSCULAR | Status: DC | PRN
  Administered 2012-10-12: 13 mL

## 2012-10-12 MED ORDER — LIDOCAINE HCL 4 % MT SOLN
OROMUCOSAL | Status: DC | PRN
  Administered 2012-10-12: 4 mL via TOPICAL

## 2012-10-12 MED ORDER — ONDANSETRON HCL 4 MG/2ML IJ SOLN
4.0000 mg | INTRAMUSCULAR | Status: DC | PRN
  Administered 2012-10-13 (×2): 4 mg via INTRAVENOUS
  Filled 2012-10-12 (×2): qty 2

## 2012-10-12 MED ORDER — CISATRACURIUM BESYLATE (PF) 10 MG/5ML IV SOLN
INTRAVENOUS | Status: DC | PRN
  Administered 2012-10-12: 2 mg via INTRAVENOUS
  Administered 2012-10-12: 8 mg via INTRAVENOUS

## 2012-10-12 MED ORDER — HYDRALAZINE HCL 20 MG/ML IJ SOLN
10.0000 mg | INTRAMUSCULAR | Status: DC | PRN
  Administered 2012-10-12 – 2012-10-14 (×4): 10 mg via INTRAVENOUS
  Filled 2012-10-12 (×4): qty 1

## 2012-10-12 MED ORDER — ALUM & MAG HYDROXIDE-SIMETH 200-200-20 MG/5ML PO SUSP: 30.0000 mL | Freq: Four times a day (QID) | ORAL | Status: DC | PRN

## 2012-10-12 SURGICAL SUPPLY — 83 items
APL SKNCLS STERI-STRIP NONHPOA (GAUZE/BANDAGES/DRESSINGS) ×1
APPLIER CLIP 5 13 M/L LIGAMAX5 (MISCELLANEOUS)
APPLIER CLIP ROT 10 11.4 M/L (STAPLE)
APR CLP MED LRG 11.4X10 (STAPLE)
APR CLP MED LRG 5 ANG JAW (MISCELLANEOUS)
BENZOIN TINCTURE PRP APPL 2/3 (GAUZE/BANDAGES/DRESSINGS) ×1 IMPLANT
BLADE EXTENDED COATED 6.5IN (ELECTRODE) ×1 IMPLANT
BLADE HEX COATED 2.75 (ELECTRODE) ×3 IMPLANT
BLADE SURG SZ10 CARB STEEL (BLADE) ×2 IMPLANT
CABLE HIGH FREQUENCY MONO STRZ (ELECTRODE) ×2 IMPLANT
CANISTER SUCTION 2500CC (MISCELLANEOUS) ×2 IMPLANT
CANNULA ENDOPATH XCEL 11M (ENDOMECHANICALS) IMPLANT
CELLS DAT CNTRL 66122 CELL SVR (MISCELLANEOUS) IMPLANT
CLIP APPLIE 5 13 M/L LIGAMAX5 (MISCELLANEOUS) IMPLANT
CLIP APPLIE ROT 10 11.4 M/L (STAPLE) IMPLANT
CLOTH BEACON ORANGE TIMEOUT ST (SAFETY) ×2 IMPLANT
CLSR STERI-STRIP ANTIMIC 1/2X4 (GAUZE/BANDAGES/DRESSINGS) ×1 IMPLANT
COUNTER NEEDLE 20 DBL MAG RED (NEEDLE) ×1 IMPLANT
COVER MAYO STAND STRL (DRAPES) ×3 IMPLANT
DECANTER SPIKE VIAL GLASS SM (MISCELLANEOUS) ×1 IMPLANT
DISSECTOR BLUNT TIP ENDO 5MM (MISCELLANEOUS) IMPLANT
DRAPE LAPAROSCOPIC ABDOMINAL (DRAPES) ×2 IMPLANT
DRAPE LG THREE QUARTER DISP (DRAPES) IMPLANT
DRAPE UTILITY XL STRL (DRAPES) ×2 IMPLANT
DRAPE WARM FLUID 44X44 (DRAPE) ×2 IMPLANT
DRSG OPSITE POSTOP 4X6 (GAUZE/BANDAGES/DRESSINGS) ×1 IMPLANT
DRSG TEGADERM 2-3/8X2-3/4 SM (GAUZE/BANDAGES/DRESSINGS) ×3 IMPLANT
ELECT REM PT RETURN 9FT ADLT (ELECTROSURGICAL) ×2
ELECTRODE REM PT RTRN 9FT ADLT (ELECTROSURGICAL) ×1 IMPLANT
FILTER SMOKE EVAC LAPAROSHD (FILTER) IMPLANT
GLOVE BIOGEL PI IND STRL 7.0 (GLOVE) ×1 IMPLANT
GLOVE BIOGEL PI INDICATOR 7.0 (GLOVE) ×1
GLOVE ECLIPSE 8.0 STRL XLNG CF (GLOVE) ×6 IMPLANT
GLOVE INDICATOR 8.0 STRL GRN (GLOVE) ×4 IMPLANT
GOWN STRL NON-REIN LRG LVL3 (GOWN DISPOSABLE) ×2 IMPLANT
GOWN STRL REIN XL XLG (GOWN DISPOSABLE) ×6 IMPLANT
KIT BASIN OR (CUSTOM PROCEDURE TRAY) ×2 IMPLANT
LEGGING LITHOTOMY PAIR STRL (DRAPES) IMPLANT
LIGASURE IMPACT 36 18CM CVD LR (INSTRUMENTS) ×1 IMPLANT
NS IRRIG 1000ML POUR BTL (IV SOLUTION) ×4 IMPLANT
PENCIL BUTTON HOLSTER BLD 10FT (ELECTRODE) ×3 IMPLANT
RELOAD PROXIMATE 75MM BLUE (ENDOMECHANICALS) ×4 IMPLANT
RELOAD STAPLE 75 3.8 BLU REG (ENDOMECHANICALS) IMPLANT
RETRACTOR WND ALEXIS 18 MED (MISCELLANEOUS) IMPLANT
RTRCTR WOUND ALEXIS 18CM MED (MISCELLANEOUS)
SCALPEL HARMONIC ACE (MISCELLANEOUS) IMPLANT
SCISSORS LAP 5X35 DISP (ENDOMECHANICALS) ×2 IMPLANT
SET IRRIG TUBING LAPAROSCOPIC (IRRIGATION / IRRIGATOR) ×1 IMPLANT
SLEEVE XCEL OPT CAN 5 100 (ENDOMECHANICALS) ×3 IMPLANT
SOLUTION ANTI FOG 6CC (MISCELLANEOUS) ×2 IMPLANT
SPONGE GAUZE 4X4 12PLY (GAUZE/BANDAGES/DRESSINGS) ×3 IMPLANT
SPONGE LAP 18X18 X RAY DECT (DISPOSABLE) ×4 IMPLANT
STAPLER 90 3.5 STAND SLIM (STAPLE) ×2
STAPLER 90 3.5 STD SLIM (STAPLE) IMPLANT
STAPLER PROXIMATE 75MM BLUE (STAPLE) ×1 IMPLANT
STAPLER VISISTAT 35W (STAPLE) ×2 IMPLANT
SUCTION POOLE TIP (SUCTIONS) ×2 IMPLANT
SUT MNCRL AB 4-0 PS2 18 (SUTURE) ×1 IMPLANT
SUT PDS AB 1 CTX 36 (SUTURE) ×2 IMPLANT
SUT PDS AB 1 TP1 96 (SUTURE) IMPLANT
SUT PROLENE 2 0 SH DA (SUTURE) IMPLANT
SUT SILK 2 0 (SUTURE) ×2
SUT SILK 2 0 SH CR/8 (SUTURE) ×2 IMPLANT
SUT SILK 2-0 18XBRD TIE 12 (SUTURE) ×1 IMPLANT
SUT SILK 3 0 (SUTURE) ×2
SUT SILK 3 0 SH CR/8 (SUTURE) ×2 IMPLANT
SUT SILK 3-0 18XBRD TIE 12 (SUTURE) ×1 IMPLANT
SUT VICRYL 2 0 18  UND BR (SUTURE) ×1
SUT VICRYL 2 0 18 UND BR (SUTURE) ×1 IMPLANT
SYR BULB IRRIGATION 50ML (SYRINGE) ×1 IMPLANT
SYS LAPSCP GELPORT 120MM (MISCELLANEOUS) ×2
SYSTEM LAPSCP GELPORT 120MM (MISCELLANEOUS) IMPLANT
TOWEL OR 17X26 10 PK STRL BLUE (TOWEL DISPOSABLE) ×3 IMPLANT
TOWEL OR NON WOVEN STRL DISP B (DISPOSABLE) ×3 IMPLANT
TRAY FOLEY CATH 14FRSI W/METER (CATHETERS) ×2 IMPLANT
TRAY LAP CHOLE (CUSTOM PROCEDURE TRAY) ×2 IMPLANT
TROCAR BLADELESS OPT 5 100 (ENDOMECHANICALS) IMPLANT
TROCAR BLADELESS OPT 5 75 (ENDOMECHANICALS) ×3 IMPLANT
TROCAR XCEL BLUNT TIP 100MML (ENDOMECHANICALS) IMPLANT
TROCAR XCEL NON-BLD 11X100MML (ENDOMECHANICALS) IMPLANT
TUBING INSUFFLATION 10FT LAP (TUBING) ×2 IMPLANT
YANKAUER SUCT BULB TIP 10FT TU (MISCELLANEOUS) ×3 IMPLANT
YANKAUER SUCT BULB TIP NO VENT (SUCTIONS) ×2 IMPLANT

## 2012-10-12 NOTE — H&P (View-Only) (Signed)
Patient ID: Alan [name], male   DOB: 1932-03-02, 79 y.o.   MRN: [id number]  Chief Complaint  Patient presents with  . New Evaluation    eval cecal ca    HPI Alan [name] is a 79 y.o. male.   HPI  He is referred by Dr. Ewing Schlein for further evaluation and treatment of a highly dysplastic cecal neoplasm.  He was noted to have occult blood in his stool. He subsequently underwent a colonoscopy which demonstrated multiple polyps. The descending colon and sigmoid colon polyps were benign adenomatous polyps. The cecal polyp was large and grossly suspicious for malignancy. Biopsy demonstrated high-grade glandular dysplasia. He underwent a CT scan of the abdomen and pelvis and no metastatic disease disease was noted. The cecal tumor was large and noted on CT scan. Some thickening of the descending colon was noted. Also noted was a 6 cm x 5.5 cm infrarenal abdominal aortic aneurysm. A chest CT had been done in early February which demonstrated a left-sided rib fracture (he was having left chest wall pain) as well as some mediastinal adenopathy and stable pulmonary nodules.There is no family history of colon cancer. He denies constipation.  He is here with his wife.  Past Medical History  Diagnosis Date  . DM (diabetes mellitus) type 2 09/18/2012   COPD- on home oxygen at night Acute bronchitis Hyperlipidemia Scrotal abscess Anemia Left rib fracture Pulmonary nodules Sleep apnea BPH   Past Surgical History  Procedure Laterality Date  . Scrotum exploration    Drainage of peritonsillar abscess  History reviewed. No pertinent family history.  Social History History  Substance Use Topics  . Smoking status: Former Smoker    Types: Cigarettes  . Smokeless tobacco: Never Used  . Alcohol Use: No    Allergies  Allergen Reactions  . Sulfa Antibiotics   . Amoxicillin Rash    Current Outpatient Prescriptions  Medication Sig Dispense Refill  . albuterol (PROVENTIL) (2.5 MG/3ML) 0.083%  nebulizer solution       . amoxicillin (AMOXIL) 875 MG tablet       . atorvastatin (LIPITOR) 20 MG tablet       . fluticasone (FLONASE) 50 MCG/ACT nasal spray       . lisinopril (PRINIVIL,ZESTRIL) 5 MG tablet       . metFORMIN (GLUCOPHAGE) 500 MG tablet       . ONE TOUCH ULTRA TEST test strip       . SPIRIVA HANDIHALER 18 MCG inhalation capsule       . SYMBICORT 160-4.5 MCG/ACT inhaler       . tamsulosin (FLOMAX) 0.4 MG CAPS        No current facility-administered medications for this visit.    Review of Systems Review of Systems  Constitutional:       Weight loss.  Respiratory: Positive for cough and shortness of breath (with activity).   Cardiovascular: Negative.   Gastrointestinal: Negative.   Genitourinary: Negative.   Hematological: Negative.     Blood pressure 142/64, pulse 92, temperature 98.1 F (36.7 C), temperature source Temporal, resp. rate 16, height 5\' 8"  (1.727 m), weight 179 lb (81.194 kg).  Physical Exam Physical Exam  Constitutional:  Overweight male.  HENT:  Head: Normocephalic and atraumatic.  Eyes: EOM are normal. No scleral icterus.  Neck: Neck supple.  Cardiovascular: Normal rate and regular rhythm.   Pulmonary/Chest:  Very distant breath sounds. No wheezing.  Abdominal: Soft. He exhibits no distension and no mass. There is no tenderness.  Musculoskeletal: He exhibits no edema.  Lymphadenopathy:    He has no cervical adenopathy.  Neurological: He is alert.  Skin: Skin is warm and dry. Rash noted. Erythema: Papular rashon the trunk and upper arms.  Psychiatric: He has a normal mood and affect. His behavior is normal.    Data Reviewed Colonoscopy report. Pathology report. Notes from Dr. Ewing Schlein.  CT scans and reports.  Assessment    1. 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 This is causing occult blood loss and anemia.  2. 6 cm x 5.5 cm abdominal aortic aneurysm that is a new diagnosis.  This is been discussed with  one of the vascular surgeons-Dr. Myra Gianotti.  3. COPD and sleep apnea-uses oxygen at night. Limitation of activity due to the COPD. He is able to climb 14 steps without significant shortness of breath.  4. Type 2 diabetes mellitus. Well-controlled.  5.  New rash. Appears to be a drug-induced rash possibly secondary to amoxicillin. I have told him to stop the amoxicillin and call Dr. Modesto Charon about this.    Plan    Given the fact that this tumor is occultly bleeding causing anemia I recommended proceeding with a laparoscopic assisted partial colectomy. Once he gets over this, he may need attention to the abdominal aortic aneurysm. I am going to make a referral to Dr. Myra Gianotti for further evaluation and potential treatment of this in the future. I explained to him that the rupture rate of this aneurysm was 10-15% over the next year.  I have explained the procedure and risks of colon resection.  Risks include but are not limited to bleeding, infection, wound problems, anesthesia, anastomotic leak, need for colostomy, injury to intraabominal organs (such as intestine, spleen, kidney, bladder, ureter, etc.), ileus, irregular bowel habits. The big issue could be with his lungs and he understands this. They seem to understand and agree to proceed.       ROSENBOWER,TODD J 09/18/2012, 3:43 PM

## 2012-10-12 NOTE — Op Note (Signed)
Operative Note  Alan [name] male 79 y.o. 10/12/2012  PREOPERATIVE DX:  Highly dysplastic cecal neoplasm  POSTOPERATIVE DX:  Same  PROCEDURE:  Laparoscopic assisted right colectomy         Surgeon: Adolph Pollack   Assistants: Romie Levee M.D.  Anesthesia: General endotracheal anesthesia  Indications: This is a 79 year old male who is known to have anemia. Workup included a colonoscopy which demonstrated a large mass in the cecum. Biopsy was consistent with high-grade dysplasia. CT scan of the abdomen does not show any obvious potential metastatic disease. CT scan of the chest does show some mediastinal adenopathy. He now presents for the above procedure.    Procedure :  He was brought to the operating room placed supine on the operating table and a general anesthetic was given. The hair on the abdominal wall was clipped. A Foley catheter was inserted. The abdominal wall was widely sterilely prepped and draped.  A 5 mm incision was made in the left subcostal area. Using a 5 mm Optiview trocar and laparoscope access was gained into the peritoneal cavity. A pneumoperitoneum was then created. Inspecting the area under the trocar demonstrated no evidence of bleeding or organ injury.  A 5 mm trocar was placed in the right mid lateral abdomen. A 5 mm trocar is placed in the subumbilical area. The right colon was visualized. There were adhesions between it and the abdominal wall which are taken down sharply and with electrocautery.  The lateral attachments of the right colon were then divided sharply and with electrocautery. Using electrocautery and sharp dissection the hepatic flexure was mobilized and the proximal half of the transverse colon was mobilized as well.  The 5 mm subumbilical trocar was removed. A small midline incision was made and a GelPort placed. Using hand assistance, the remaining part of the right colon was mobilized as well as the proximal transverse colon and  until it could be brought out to the extraction site incision in the subumbilical region. The distal ileum, right colon, and proximal transverse colon then exteriorized. The colon was divided just distal to the hepatic flexure with the GIA stapler. The terminal ileum was divided with the GIA stapler. The mesentery of the section of the colon was resected using the LigaSure and tying vessels with silk ties. The specimen was then handed off the field. The neoplasm was palpable in the cecum.  A side to side staple anastomosis was then performed using the GIA stapler. The staple lines were hemostatic. The common defect was closed with a linear noncutting stapling. A second layer of 3-0 silk Lembert type sutures were also used over the common defect closure. A crotch stitch of 3-0 silk was used. The anastomosis was patent, viable, and under no tension. It was dropped back into the abdominal cavity.  Gloves and gowns were then changed. The abdominal cavity was irrigated with sterile solution and this was evacuated. There is no evidence of bleeding or organ injury. The lower incision midline fascia is then approximated with running #1 PDS suture. The subcutaneous tissue is irrigated and hemostatic.  Laparoscopy was then performed. The fascial closure was solid. 4 quadrant inspection demonstrated no evidence of bleeding or organ injury. The CO2 gas was released the trocars were removed.  The 3 trocar sites skin incisions were closed with 4 Monocryl subcuticular stitches. A limited lower midline skin incision was closed with staples. Sterile dressings were applied.  He tolerated the procedure well without any apparent complications and was extubated  and taken to the recovery room in satisfactory condition.   Estimated Blood Loss:  300 mL         Drains: none  Blood Given: none          Specimens: Right colon and terminal ileum        Complications:  * No complications entered in OR log *          Disposition: PACU - hemodynamically stable.         Condition: stable

## 2012-10-12 NOTE — Progress Notes (Signed)
Pt BP has been slowly increasing over last few hours. Paged [facility]CS for last BP 175/79.

## 2012-10-12 NOTE — Anesthesia Preprocedure Evaluation (Addendum)
Anesthesia Evaluation  Patient identified by MRN, date of birth, ID band Patient awake    Reviewed: Allergy & Precautions, H&P , NPO status , Patient's Chart, lab work & pertinent test results  Airway Mallampati: II TM Distance: >3 FB Neck ROM: Full    Dental no notable dental hx.    Pulmonary sleep apnea , COPD oxygen dependent,  breath sounds clear to auscultation  Pulmonary exam normal       Cardiovascular hypertension, Pt. on medications + Peripheral Vascular Disease Rhythm:Regular Rate:Normal  6cm AAA   Neuro/Psych negative neurological ROS  negative psych ROS   GI/Hepatic negative GI ROS, Neg liver ROS,   Endo/Other  diabetes, Type 2  Renal/GU negative Renal ROS  negative genitourinary   Musculoskeletal negative musculoskeletal ROS (+)   Abdominal   Peds negative pediatric ROS (+)  Hematology negative hematology ROS (+)   Anesthesia Other Findings   Reproductive/Obstetrics negative OB ROS                          Anesthesia Physical Anesthesia Plan  ASA: III  Anesthesia Plan: General   Post-op Pain Management:    Induction: Intravenous  Airway Management Planned: LMA  Additional Equipment:   Intra-op Plan:   Post-operative Plan:   Informed Consent: I have reviewed the patients History and Physical, chart, labs and discussed the procedure including the risks, benefits and alternatives for the proposed anesthesia with the patient or authorized representative who has indicated his/her understanding and acceptance.   Dental advisory given  Plan Discussed with: CRNA and Surgeon  Anesthesia Plan Comments:         Anesthesia Quick Evaluation

## 2012-10-12 NOTE — Anesthesia Postprocedure Evaluation (Signed)
  Anesthesia Post-op Note  Patient: Alan [name]  Procedure(s) Performed: Procedure(s) (LRB): LAPAROSCOPIC PARTIAL COLECTOMY (N/A)  Patient Location: PACU  Anesthesia Type: General  Level of Consciousness: awake and alert   Airway and Oxygen Therapy: Patient Spontanous Breathing  Post-op Pain: mild  Post-op Assessment: Post-op Vital signs reviewed, Patient's Cardiovascular Status Stable, Respiratory Function Stable, Patent Airway and No signs of Nausea or vomiting  Last Vitals:  Filed Vitals:   10/12/12 1030  BP: 162/70  Pulse: 86  Temp:   Resp: 18    Post-op Vital Signs: stable   Complications: No apparent anesthesia complications

## 2012-10-12 NOTE — Progress Notes (Signed)
CARE MANAGEMENT NOTE 10/12/2012  Patient:  Alan [name], Alan [name]   Account Number:  [id number]  Date Initiated:  10/12/2012  Documentation initiated by:  Mauricio Dahlen  Subjective/Objective Assessment:   s/p lap rt hemi-coloectomy, episodes of hypotension iv phenylephrine (NEO-SYNEPHRINE) injection : given in pacu and transferred to sdu for monoriting.     Action/Plan:   home when stable   Anticipated DC Date:  10/15/2012   Anticipated DC Plan:  HOME/SELF CARE  In-house referral  NA      DC Planning Services  NA      [facility] Choice  NA   Choice offered to / List presented to:  NA   DME arranged  NA      DME agency  NA     HH arranged  NA      HH agency  NA   Status of service:  In process, will continue to follow Medicare Important Message given?  NA - LOS <3 / Initial given by admissions (If response is "NO", the following Medicare IM given date fields will be blank) Date Medicare IM given:   Date Additional Medicare IM given:    Discharge Disposition:    Per UR Regulation:  Reviewed for med. necessity/level of care/duration of stay  If discussed at Long Length of Stay Meetings, dates discussed:    Comments:  [id number] Earlene Plater, RN, BSN, CCM:  CHART REVIEWED AND UPDATED.  Next chart review due on [id number]. NO DISCHARGE NEEDS PRESENT AT THIS TIME. CASE MANAGEMENT [id number]

## 2012-10-12 NOTE — Interval H&P Note (Signed)
History and Physical Interval Note:  10/12/2012 7:31 AM  Alan [name]  has presented today for surgery, with the diagnosis of cecal neoplasm  The various methods of treatment have been discussed with the patient and family. After consideration of risks, benefits and other options for treatment, the patient has consented to  Procedure(s): LAPAROSCOPIC PARTIAL COLECTOMY (N/A) as a surgical intervention .  The patient's history has been reviewed, patient examined, no change in status, stable for surgery.  I have reviewed the patient's chart and labs.  Questions were answered to the patient's satisfaction.     Orpheus [place] Shela Commons

## 2012-10-12 NOTE — Transfer of Care (Signed)
Immediate Anesthesia Transfer of Care Note  Patient: Alan [name]  Procedure(s) Performed: Procedure(s): LAPAROSCOPIC PARTIAL COLECTOMY (N/A)  Patient Location: PACU  Anesthesia Type:General  Level of Consciousness: awake, oriented, patient cooperative, lethargic and responds to stimulation  Airway & Oxygen Therapy: Patient Spontanous Breathing and Patient connected to face mask oxygen  Post-op Assessment: Report given to PACU RN, Post -op Vital signs reviewed and stable and Patient moving all extremities  Post vital signs: Reviewed and stable  Complications: No apparent anesthesia complications

## 2012-10-12 NOTE — Preoperative (Signed)
Beta Blockers   Reason not to administer Beta Blockers:Not Applicable 

## 2012-10-12 NOTE — Progress Notes (Signed)
Dr. Dwain Sarna will review Pt home meds and put in PRN order for BP med.

## 2012-10-13 ENCOUNTER — Encounter (HOSPITAL_COMMUNITY): Payer: Self-pay

## 2012-10-13 DIAGNOSIS — I10 Essential (primary) hypertension: Secondary | ICD-10-CM

## 2012-10-13 LAB — GLUCOSE, CAPILLARY
Glucose-Capillary: 170 mg/dL — ABNORMAL HIGH (ref 70–99)
Glucose-Capillary: 141 mg/dL — ABNORMAL HIGH (ref 70–99)

## 2012-10-13 LAB — BASIC METABOLIC PANEL
BUN: 9 mg/dL (ref 6–23)
CO2: 30 mEq/L (ref 19–32)
Chloride: 98 mEq/L (ref 96–112)
Glucose, Bld: 218 mg/dL — ABNORMAL HIGH (ref 70–99)
Potassium: 4 mEq/L (ref 3.5–5.1)
Sodium: 134 mEq/L — ABNORMAL LOW (ref 135–145)

## 2012-10-13 LAB — CBC
HCT: 28.2 % — ABNORMAL LOW (ref 39.0–52.0)
Hemoglobin: 9.5 g/dL — ABNORMAL LOW (ref 13.0–17.0)
MCH: 29.2 pg (ref 26.0–34.0)
MCHC: 33.7 g/dL (ref 30.0–36.0)
MCV: 86.8 fL (ref 78.0–100.0)
RBC: 3.25 MIL/uL — ABNORMAL LOW (ref 4.22–5.81)

## 2012-10-13 MED ORDER — FLUTICASONE PROPIONATE 50 MCG/ACT NA SUSP
2.0000 | Freq: Every day | NASAL | Status: DC
  Administered 2012-10-13 – 2012-10-19 (×7): 2 via NASAL
  Filled 2012-10-13 (×2): qty 16

## 2012-10-13 MED ORDER — LABETALOL HCL 5 MG/ML IV SOLN
20.0000 mg | INTRAVENOUS | Status: DC | PRN
  Filled 2012-10-13: qty 4

## 2012-10-13 MED ORDER — PANTOPRAZOLE SODIUM 40 MG IV SOLR
40.0000 mg | INTRAVENOUS | Status: DC
  Administered 2012-10-13 – 2012-10-15 (×3): 40 mg via INTRAVENOUS
  Filled 2012-10-13 (×5): qty 40

## 2012-10-13 MED ORDER — INSULIN ASPART 100 UNIT/ML ~~LOC~~ SOLN
0.0000 [IU] | SUBCUTANEOUS | Status: DC
  Administered 2012-10-13 (×2): 2 [IU] via SUBCUTANEOUS
  Administered 2012-10-13 (×2): 3 [IU] via SUBCUTANEOUS
  Administered 2012-10-14 (×5): 2 [IU] via SUBCUTANEOUS
  Administered 2012-10-15: 3 [IU] via SUBCUTANEOUS
  Administered 2012-10-15: 2 [IU] via SUBCUTANEOUS

## 2012-10-13 NOTE — Progress Notes (Signed)
1 Day Post-Op  Subjective: Having some N/V of small amounts of dark fluid.  Did not sleep well.  Objective: Vital signs in last 24 hours: Temp:  [97.8 F (36.6 C)-99.2 F (37.3 C)] 98.4 F (36.9 C) (04/04 0400) Pulse Rate:  [80-108] 102 (04/04 0600) Resp:  [15-28] 19 (04/04 0600) BP: (129-194)/(56-86) 142/70 mmHg (04/04 0600) SpO2:  [95 %-100 %] 96 % (04/04 0600) Weight:  [178 lb 2.1 oz (80.8 kg)] 178 lb 2.1 oz (80.8 kg) (04/03 1100) Last BM Date: 10/12/12  Intake/Output from previous day: 04/03 0701 - 04/04 0700 In: 2325 [I.V.:2275; IV Piggyback:50] Out: 1775 [Urine:1675] Intake/Output this shift:    PE: General- In NAD Lungs-clear Abdomen-soft, dried drainage on lower midline dressing, hypoactive bowel sounds.  Lab Results:   Recent Labs  10/13/12 0350  WBC 11.4*  HGB 9.5*  HCT 28.2*  PLT 235   BMET  Recent Labs  10/13/12 0350  NA 134*  K 4.0  CL 98  CO2 30  GLUCOSE 218*  BUN 9  CREATININE 0.56  CALCIUM 9.2   PT/INR No results found for this basename: LABPROT, INR,  in the last 72 hours Comprehensive Metabolic Panel:    Component Value Date/Time   NA 134* 10/13/2012 0350   K 4.0 10/13/2012 0350   CL 98 10/13/2012 0350   CO2 30 10/13/2012 0350   BUN 9 10/13/2012 0350   CREATININE 0.56 10/13/2012 0350   GLUCOSE 218* 10/13/2012 0350   CALCIUM 9.2 10/13/2012 0350   AST 12 10/05/2012 1050   ALT 6 10/05/2012 1050   ALKPHOS 54 10/05/2012 1050   BILITOT 0.3 10/05/2012 1050   PROT 7.7 10/05/2012 1050   ALBUMIN 3.5 10/05/2012 1050     Studies/Results: No results found.  Anti-infectives: Anti-infectives   Start     Dose/Rate Route Frequency Ordered Stop   10/12/12 1400  cefoTEtan (CEFOTAN) 2 g in dextrose 5 % 50 mL IVPB     2 g 100 mL/hr over 30 Minutes Intravenous Every 12 hours 10/12/12 1120 10/12/12 1237   10/12/12 0504  cefOXitin (MEFOXIN) 2 g in dextrose 5 % 50 mL IVPB     2 g 100 mL/hr over 30 Minutes Intravenous On call to O.R. 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 Active Problems:   DM (diabetes mellitus) type 2-glucose 218 this AM.   Chronic obstructive pulmonary disease (COPD)   HTN (hypertension)    LOS: 1 day   Plan: Add Labetolol prn for hypertension.  OOB to chair.  Protonix IV.  SSI.   Alan [name] 10/13/2012

## 2012-10-13 NOTE — Progress Notes (Signed)
Inpatient Diabetes Program Recommendations  AACE/ADA: New Consensus Statement on Inpatient Glycemic Control (2013)  Target Ranges:  Prepandial:   less than 140 mg/dL      Peak postprandial:   less than 180 mg/dL (1-2 hours)      Critically ill patients:  140 - 180 mg/dL   Reason for Assessment:  Hyperglycemia  Results for Alan [name], Alan [name] (MRN [id number]) as of 10/13/2012 11:13  Ref. Range 10/13/2012 03:50  Sodium Latest Range: 135-145 mEq/L 134 (L)  Potassium Latest Range: 3.5-5.1 mEq/L 4.0  Chloride Latest Range: 96-112 mEq/L 98  CO2 Latest Range: 19-32 mEq/L 30  BUN Latest Range: 6-23 mg/dL 9  Creatinine Latest Range: 0.50-1.35 mg/dL 4.09  Calcium Latest Range: 8.4-10.5 mg/dL 9.2  GFR calc non Af Amer Latest Range: >90 mL/min >90  GFR calc Af Amer Latest Range: >90 mL/min >90  Glucose Latest Range: 70-99 mg/dL 811 (H)  Results for Alan [name], Alan [name] (MRN [id number]) as of 10/13/2012 11:13  Ref. Range 10/12/2012 05:40 10/12/2012 09:57  Glucose-Capillary Latest Range: 70-99 mg/dL 213 (H) 086 (H)    Inpatient Diabetes Program Recommendations Insulin - Basal: If FBS continues >180 mg/dL, consider addition of Lantus 8 units QHS Diet: When diet is advanced, CHO mod med  Note: Will continue to follow.  Thank you. Ailene Ards, RD, LDN, CDE Inpatient Diabetes Coordinator [phone]

## 2012-10-14 LAB — GLUCOSE, CAPILLARY
Glucose-Capillary: 138 mg/dL — ABNORMAL HIGH (ref 70–99)
Glucose-Capillary: 130 mg/dL — ABNORMAL HIGH (ref 70–99)
Glucose-Capillary: 140 mg/dL — ABNORMAL HIGH (ref 70–99)

## 2012-10-14 LAB — BASIC METABOLIC PANEL
CO2: 29 mEq/L (ref 19–32)
Chloride: 99 mEq/L (ref 96–112)
GFR calc Af Amer: >90 mL/min (ref >90)
Sodium: 137 mEq/L (ref 135–145)

## 2012-10-14 LAB — CBC
Platelets: 259 10*3/uL (ref 150–400)
RBC: 2.91 MIL/uL — ABNORMAL LOW (ref 4.22–5.81)
RDW: 14.6 % (ref 11.5–15.5)
WBC: 12.5 10*3/uL — ABNORMAL HIGH (ref 4.0–10.5)

## 2012-10-14 MED ORDER — KCL IN DEXTROSE-NACL 20-5-0.45 MEQ/L-%-% IV SOLN
INTRAVENOUS | Status: DC
  Administered 2012-10-14: 1000 mL via INTRAVENOUS
  Administered 2012-10-14 – 2012-10-17 (×4): via INTRAVENOUS
  Filled 2012-10-14 (×5): qty 1000

## 2012-10-14 MED ORDER — LORATADINE 10 MG PO TABS
10.0000 mg | ORAL_TABLET | Freq: Every day | ORAL | Status: DC
  Administered 2012-10-14 – 2012-10-19 (×6): 10 mg via ORAL
  Filled 2012-10-14 (×7): qty 1

## 2012-10-14 MED ORDER — PHENOL 1.4 % MT LIQD
1.0000 | OROMUCOSAL | Status: DC | PRN
  Filled 2012-10-14: qty 177

## 2012-10-14 NOTE — Progress Notes (Signed)
2 Days Post-Op  Subjective: Having some coughing and anxiety overnight.  Did not sleep well.  Did better with albuterol treatment.  Denies nausea, no flatus  Objective: Vital signs in last 24 hours: Temp:  [97.6 F (36.4 C)-98.7 F (37.1 C)] 98.7 F (37.1 C) (04/05 0400) Pulse Rate:  [85-109] 96 (04/05 0600) Resp:  [20-24] 22 (04/05 0600) BP: (125-170)/(55-88) 156/58 mmHg (04/05 0600) SpO2:  [91 %-100 %] 98 % (04/05 0600) Weight:  [169 lb 5 oz (76.8 kg)] 169 lb 5 oz (76.8 kg) (04/05 0354) Last BM Date: 10/12/12  Intake/Output from previous day: 04/04 0701 - 04/05 0700 In: 1840 [I.V.:1840] Out: 1475 [Urine:1475] Intake/Output this shift:    PE: General- In NAD Lungs-clear Abdomen-soft, dried drainage on lower midline dressing, hypoactive bowel sounds.  RLQ wound draining quite a bit of SS fluid  Lab Results:   Recent Labs  10/13/12 0350 10/14/12 0343  WBC 11.4* 12.5*  HGB 9.5* 8.5*  HCT 28.2* 25.6*  PLT 235 259   BMET  Recent Labs  10/13/12 0350 10/14/12 0343  NA 134* 137  K 4.0 3.5  CL 98 99  CO2 30 29  GLUCOSE 218* 142*  BUN 9 11  CREATININE 0.56 0.62  CALCIUM 9.2 9.2   PT/INR No results found for this basename: LABPROT, INR,  in the last 72 hours Comprehensive Metabolic Panel:    Component Value Date/Time   NA 137 10/14/2012 0343   K 3.5 10/14/2012 0343   CL 99 10/14/2012 0343   CO2 29 10/14/2012 0343   BUN 11 10/14/2012 0343   CREATININE 0.62 10/14/2012 0343   GLUCOSE 142* 10/14/2012 0343   CALCIUM 9.2 10/14/2012 0343   AST 12 10/05/2012 1050   ALT 6 10/05/2012 1050   ALKPHOS 54 10/05/2012 1050   BILITOT 0.3 10/05/2012 1050   PROT 7.7 10/05/2012 1050   ALBUMIN 3.5 10/05/2012 1050     Studies/Results: No results found.  Anti-infectives: Anti-infectives   Start     Dose/Rate Route Frequency Ordered Stop   10/12/12 1400  cefoTEtan (CEFOTAN) 2 g in dextrose 5 % 50 mL IVPB     2 g 100 mL/hr over 30 Minutes Intravenous Every 12 hours 10/12/12 1120 10/12/12  1237   10/12/12 0504  cefOXitin (MEFOXIN) 2 g in dextrose 5 % 50 mL IVPB     2 g 100 mL/hr over 30 Minutes Intravenous On call to O.R. 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 Active Problems:   DM (diabetes mellitus) type 2-glucose 218 this AM.   Chronic obstructive pulmonary disease (COPD)   HTN (hypertension)    LOS: 2 days   Plan: Cont Labetolol prn for hypertension.  OOB to chair.  Protonix IV.  SSI.  Cont NPO.  Good UOP. Will decrease MIV and d/c foley   Nitara Szczerba C. 10/14/2012

## 2012-10-15 LAB — GLUCOSE, CAPILLARY
Glucose-Capillary: 112 mg/dL — ABNORMAL HIGH (ref 70–99)
Glucose-Capillary: 120 mg/dL — ABNORMAL HIGH (ref 70–99)
Glucose-Capillary: 120 mg/dL — ABNORMAL HIGH (ref 70–99)

## 2012-10-15 MED ORDER — ACETAMINOPHEN-CODEINE #3 300-30 MG PO TABS
1.0000 | ORAL_TABLET | ORAL | Status: DC | PRN
  Administered 2012-10-15 – 2012-10-16 (×4): 1 via ORAL
  Administered 2012-10-16: 2 via ORAL
  Administered 2012-10-17 – 2012-10-19 (×9): 1 via ORAL
  Filled 2012-10-15 (×8): qty 1
  Filled 2012-10-15: qty 2
  Filled 2012-10-15: qty 1
  Filled 2012-10-15: qty 2
  Filled 2012-10-15 (×3): qty 1

## 2012-10-15 NOTE — Progress Notes (Signed)
3 Days Post-Op  Subjective:  coughing overnight.  Did not sleep well.   Denies nausea, having flatus but no BM yet.  Objective: Vital signs in last 24 hours: Temp:  [97.7 F (36.5 C)-98.5 F (36.9 C)] 98.5 F (36.9 C) (04/06 0400) Pulse Rate:  [82-124] 104 (04/06 0400) Resp:  [16-24] 24 (04/06 0400) BP: (104-156)/(37-63) 123/55 mmHg (04/06 0400) SpO2:  [94 %-100 %] 97 % (04/06 0400) Weight:  [170 lb 13.7 oz (77.5 kg)] 170 lb 13.7 oz (77.5 kg) (04/06 0400) Last BM Date: 10/14/12  Intake/Output from previous day: 04/05 0701 - 04/06 0700 In: 1675 [I.V.:1535] Out: 950 [Urine:950] Intake/Output this shift:    PE: General- In NAD Lungs-clear, coarse airway sounds Abdomen-soft, dried drainage on lower midline dressing,  RLQ wound draining small amt of SS fluid  Lab Results:   Recent Labs  10/13/12 0350 10/14/12 0343  WBC 11.4* 12.5*  HGB 9.5* 8.5*  HCT 28.2* 25.6*  PLT 235 259   BMET  Recent Labs  10/13/12 0350 10/14/12 0343  NA 134* 137  K 4.0 3.5  CL 98 99  CO2 30 29  GLUCOSE 218* 142*  BUN 9 11  CREATININE 0.56 0.62  CALCIUM 9.2 9.2   PT/INR No results found for this basename: LABPROT, INR,  in the last 72 hours Comprehensive Metabolic Panel:    Component Value Date/Time   NA 137 10/14/2012 0343   K 3.5 10/14/2012 0343   CL 99 10/14/2012 0343   CO2 29 10/14/2012 0343   BUN 11 10/14/2012 0343   CREATININE 0.62 10/14/2012 0343   GLUCOSE 142* 10/14/2012 0343   CALCIUM 9.2 10/14/2012 0343   AST 12 10/05/2012 1050   ALT 6 10/05/2012 1050   ALKPHOS 54 10/05/2012 1050   BILITOT 0.3 10/05/2012 1050   PROT 7.7 10/05/2012 1050   ALBUMIN 3.5 10/05/2012 1050     Studies/Results: No results found.  Anti-infectives: Anti-infectives   Start     Dose/Rate Route Frequency Ordered Stop   10/12/12 1400  cefoTEtan (CEFOTAN) 2 g in dextrose 5 % 50 mL IVPB     2 g 100 mL/hr over 30 Minutes Intravenous Every 12 hours 10/12/12 1120 10/12/12 1237   10/12/12 0504  cefOXitin  (MEFOXIN) 2 g in dextrose 5 % 50 mL IVPB     2 g 100 mL/hr over 30 Minutes Intravenous On call to O.R. 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 Active Problems:   DM (diabetes mellitus) type 2-glucose 218 this AM.   Chronic obstructive pulmonary disease (COPD)   HTN (hypertension)    LOS: 3 days   Plan: Cont Labetolol prn for hypertension.  OOB to chair.  Protonix IV.  ISS for blood glucose control.  Adequate UOP. Will decrease MIV.  Having flatus, will start clears today.   Cartez Mogle C. 10/15/2012

## 2012-10-16 DIAGNOSIS — E119 Type 2 diabetes mellitus without complications: Secondary | ICD-10-CM

## 2012-10-16 LAB — GLUCOSE, CAPILLARY
Glucose-Capillary: 113 mg/dL — ABNORMAL HIGH (ref 70–99)
Glucose-Capillary: 116 mg/dL — ABNORMAL HIGH (ref 70–99)
Glucose-Capillary: 116 mg/dL — ABNORMAL HIGH (ref 70–99)
Glucose-Capillary: 118 mg/dL — ABNORMAL HIGH (ref 70–99)
Glucose-Capillary: 122 mg/dL — ABNORMAL HIGH (ref 70–99)

## 2012-10-16 MED ORDER — PANTOPRAZOLE SODIUM 40 MG PO TBEC
40.0000 mg | DELAYED_RELEASE_TABLET | Freq: Every day | ORAL | Status: DC
  Administered 2012-10-16 – 2012-10-19 (×4): 40 mg via ORAL
  Filled 2012-10-16 (×4): qty 1

## 2012-10-16 MED ORDER — INSULIN ASPART 100 UNIT/ML ~~LOC~~ SOLN
0.0000 [IU] | Freq: Three times a day (TID) | SUBCUTANEOUS | Status: DC
  Administered 2012-10-16: 3 [IU] via SUBCUTANEOUS
  Administered 2012-10-16 – 2012-10-18 (×4): 2 [IU] via SUBCUTANEOUS

## 2012-10-16 NOTE — Progress Notes (Addendum)
Pharmacy Brief Note - Alvimopan (Entereg)  The standing order set for alvimopan (Entereg) now includes an automatic order to discontinue the drug after the patient has had a bowel movement.  The change was approved by the Pharmacy & Therapeutics Committee and the Medical Executive Committee.    This patient has had a bowel movement documented by nursing.  Therefore, alvimopan has been discontinued.  If there are questions, please contact the pharmacy at [phone].   CONCERNING: Proton Pump Inhibitor IV to Oral Route Change Policy  The patient is receiving Protonix by the intravenous route. Based on criteria approved by the Pharmacy and Therapeutics Committee and the Medical Executive Committee, the medication is being converted to the equivalent oral dose form.   These criteria include:  -No Active GI bleeding  -Able to tolerate diet of full liquids (or better) or tube feeding  -Able to tolerate other medications by the oral or enteral route   If you have any questions about this conversion, please contact the Pharmacy Department (ext [phone]). Thank you.   Loralee Pacas, PharmD, BCPS 10/16/2012 9:23 AM

## 2012-10-16 NOTE — Progress Notes (Signed)
4 Days Post-Op  Subjective: Feeling better overall.  Bowels moving.  Tolerating liquids.  Still coughing some.  Only has pain when he coughs.  Walking.  Voiding well.  Objective: Vital signs in last 24 hours: Temp:  [98 F (36.7 C)-98.7 F (37.1 C)] 98.7 F (37.1 C) (04/07 0400) Pulse Rate:  [87-100] 87 (04/07 0500) Resp:  [16-23] 18 (04/07 0500) BP: (102-157)/(24-62) 136/24 mmHg (04/07 0500) SpO2:  [98 %-100 %] 100 % (04/07 0500) Last BM Date: 10/15/12  Intake/Output from previous day: 04/06 0701 - 04/07 0700 In: 1918.3 [P.O.:800; I.V.:1118.3] Out: 1025 [Urine:1025] Intake/Output this shift:    PE: General- In NAD Lungs-clear Abdomen-soft, incisions clean and intact Lab Results:   Recent Labs  10/14/12 0343  WBC 12.5*  HGB 8.5*  HCT 25.6*  PLT 259   BMET  Recent Labs  10/14/12 0343  NA 137  K 3.5  CL 99  CO2 29  GLUCOSE 142*  BUN 11  CREATININE 0.62  CALCIUM 9.2   PT/INR No results found for this basename: LABPROT, INR,  in the last 72 hours Comprehensive Metabolic Panel:    Component Value Date/Time   NA 137 10/14/2012 0343   K 3.5 10/14/2012 0343   CL 99 10/14/2012 0343   CO2 29 10/14/2012 0343   BUN 11 10/14/2012 0343   CREATININE 0.62 10/14/2012 0343   GLUCOSE 142* 10/14/2012 0343   CALCIUM 9.2 10/14/2012 0343   AST 12 10/05/2012 1050   ALT 6 10/05/2012 1050   ALKPHOS 54 10/05/2012 1050   BILITOT 0.3 10/05/2012 1050   PROT 7.7 10/05/2012 1050   ALBUMIN 3.5 10/05/2012 1050   Pathology:  T2N0 adenocarcinoma  Studies/Results: No results found.  Anti-infectives: Anti-infectives   Start     Dose/Rate Route Frequency Ordered Stop   10/12/12 1400  cefoTEtan (CEFOTAN) 2 g in dextrose 5 % 50 mL IVPB     2 g 100 mL/hr over 30 Minutes Intravenous Every 12 hours 10/12/12 1120 10/12/12 1237   10/12/12 0504  cefOXitin (MEFOXIN) 2 g in dextrose 5 % 50 mL IVPB     2 g 100 mL/hr over 30 Minutes Intravenous On call to O.R. 10/12/12 0505 10/12/12 0730       Assessment Principal Problem:   T2N0 adenocarcinoma of cecum s/p lap right colectomy-pathology explained to him and his wife; he is making progress postop Active Problems:   DM (diabetes mellitus) type 2-good control with SSI   Chronic obstructive pulmonary disease (COPD)   HTN (hypertension)-resolved  Acute on chronic anemia    LOS: 4 days   Plan:  Advance diet.  Transfer to floor.  Will need outpatient medical oncology consultation.  Deron Poole J 10/16/2012

## 2012-10-17 ENCOUNTER — Encounter (INDEPENDENT_AMBULATORY_CARE_PROVIDER_SITE_OTHER): Payer: Self-pay

## 2012-10-17 LAB — GLUCOSE, CAPILLARY: Glucose-Capillary: 128 mg/dL — ABNORMAL HIGH (ref 70–99)

## 2012-10-17 LAB — CBC
MCH: 28.6 pg (ref 26.0–34.0)
MCHC: 32.7 g/dL (ref 30.0–36.0)
MCV: 87.5 fL (ref 78.0–100.0)
Platelets: 211 10*3/uL (ref 150–400)
RBC: 2.48 MIL/uL — ABNORMAL LOW (ref 4.22–5.81)

## 2012-10-17 MED ORDER — ALBUTEROL SULFATE (5 MG/ML) 0.5% IN NEBU
2.5000 mg | INHALATION_SOLUTION | RESPIRATORY_TRACT | Status: DC | PRN
  Administered 2012-10-19: 2.5 mg via RESPIRATORY_TRACT
  Filled 2012-10-17: qty 0.5

## 2012-10-17 MED ORDER — ALBUTEROL SULFATE (5 MG/ML) 0.5% IN NEBU
2.5000 mg | INHALATION_SOLUTION | Freq: Three times a day (TID) | RESPIRATORY_TRACT | Status: DC
  Administered 2012-10-17 – 2012-10-19 (×5): 2.5 mg via RESPIRATORY_TRACT
  Filled 2012-10-17 (×5): qty 0.5

## 2012-10-17 MED ORDER — FERROUS SULFATE 325 (65 FE) MG PO TABS
325.0000 mg | ORAL_TABLET | Freq: Three times a day (TID) | ORAL | Status: DC
  Administered 2012-10-17 – 2012-10-19 (×6): 325 mg via ORAL
  Filled 2012-10-17 (×9): qty 1

## 2012-10-17 NOTE — Progress Notes (Signed)
5 Days Post-Op  Subjective: Feels good.  Tolerating diet.  Having dark stools. Objective: Vital signs in last 24 hours: Temp:  [97.8 F (36.6 C)-99.1 F (37.3 C)] 98 F (36.7 C) (04/08 9811) Pulse Rate:  [85-96] 88 (04/08 0608) Resp:  [18-24] 22 (04/08 0608) BP: (92-123)/(42-66) 123/66 mmHg (04/08 0608) SpO2:  [95 %-100 %] 100 % (04/08 0608) Last BM Date: 10/16/12  Intake/Output from previous day: 04/07 0701 - 04/08 0700 In: 2047.5 [P.O.:840; I.V.:1207.5] Out: 950 [Urine:950] Intake/Output this shift:    PE: General- In NAD Lungs-clear Abdomen-soft, incisions clean and intact with some ecchymosis around midline incision Lab Results:   Recent Labs  10/17/12 0510  WBC 5.4  HGB 7.1*  HCT 21.7*  PLT 211   BMET No results found for this basename: NA, K, CL, CO2, GLUCOSE, BUN, CREATININE, CALCIUM,  in the last 72 hours PT/INR No results found for this basename: LABPROT, INR,  in the last 72 hours Comprehensive Metabolic Panel:    Component Value Date/Time   NA 137 10/14/2012 0343   K 3.5 10/14/2012 0343   CL 99 10/14/2012 0343   CO2 29 10/14/2012 0343   BUN 11 10/14/2012 0343   CREATININE 0.62 10/14/2012 0343   GLUCOSE 142* 10/14/2012 0343   CALCIUM 9.2 10/14/2012 0343   AST 12 10/05/2012 1050   ALT 6 10/05/2012 1050   ALKPHOS 54 10/05/2012 1050   BILITOT 0.3 10/05/2012 1050   PROT 7.7 10/05/2012 1050   ALBUMIN 3.5 10/05/2012 1050   Pathology:  T2N0 adenocarcinoma  Studies/Results: No results found.  Anti-infectives: Anti-infectives   Start     Dose/Rate Route Frequency Ordered Stop   10/12/12 1400  cefoTEtan (CEFOTAN) 2 g in dextrose 5 % 50 mL IVPB     2 g 100 mL/hr over 30 Minutes Intravenous Every 12 hours 10/12/12 1120 10/12/12 1237   10/12/12 0504  cefOXitin (MEFOXIN) 2 g in dextrose 5 % 50 mL IVPB     2 g 100 mL/hr over 30 Minutes Intravenous On call to O.R. 10/12/12 0505 10/12/12 0730      Assessment Principal Problem:   T2N0 adenocarcinoma of cecum s/p lap  right colectomy-doing well Active Problems:   DM (diabetes mellitus) type 2-good control with SSI   Chronic obstructive pulmonary disease (COPD)   HTN (hypertension)-resolved  Acute on chronic anemia-hgb down to 7.1; he is asx.    LOS: 5 days   Plan:  Heplock IV.  Start Iron.  Recheck hgb tomorrow.  Nitza Schmid J 10/17/2012

## 2012-10-17 NOTE — Progress Notes (Signed)
Spoke with patient about nocturnal positive pressure for his sleep apnea. He refuses CPAP at this time. Wife, at bedside, states the patient has never tolerated wearing the mask at home. Although the patient himself declines the use of CPAP now, he does admit that wearing oxygen at night helps at home and prefers to continue doing the same during this admission. Education provided. Patient verbalizes his understanding and continues to refuse.

## 2012-10-18 LAB — PREPARE RBC (CROSSMATCH)

## 2012-10-18 LAB — CBC
HCT: 21 % — ABNORMAL LOW (ref 39.0–52.0)
Hemoglobin: 6.9 g/dL — CL (ref 13.0–17.0)
MCV: 87.5 fL (ref 78.0–100.0)
RBC: 2.4 MIL/uL — ABNORMAL LOW (ref 4.22–5.81)
RDW: 14.9 % (ref 11.5–15.5)
WBC: 5.8 10*3/uL (ref 4.0–10.5)

## 2012-10-18 NOTE — Progress Notes (Signed)
Pt refuses CPAP, prefers nasal oxygen. RT will assist as needed.

## 2012-10-18 NOTE — Progress Notes (Signed)
Patient hgb 6.9 on 10/18/2012 was 7.1 on 10/17/2012 contacted physician on call Dr Johna Sheriff he stated not too big of a drop will let Dr Abbey Chatters decide on transfusion. Lurena Joiner, RN

## 2012-10-18 NOTE — Progress Notes (Signed)
   Critical value received:  Hgb 6.9  Date of notification:  10/18/2012  Time of notification:  456  Critical value read back:yes  Nurse who received alert:  Lurena Joiner, RN   MD notified (1st page):  [place]    Time of first page:  457  MD notified (2nd page):  Time of second page:  Responding MD:  [place]   Time MD responded:  458

## 2012-10-18 NOTE — Progress Notes (Signed)
6 Days Post-Op  Subjective: Feels good.  No dizziness. Ambulating.  Tolerating diet.  Stools still a little tarry. Objective: Vital signs in last 24 hours: Temp:  [97.9 F (36.6 C)-99.1 F (37.3 C)] 98.9 F (37.2 C) (04/09 0557) Pulse Rate:  [90-111] 98 (04/09 0557) Resp:  [18-20] 20 (04/09 0557) BP: (102-129)/(49-70) 108/65 mmHg (04/09 0557) SpO2:  [90 %-94 %] 93 % (04/09 0557) Last BM Date: 10/16/12  Intake/Output from previous day: 04/08 0701 - 04/09 0700 In: 720 [P.O.:720] Out: 250 [Urine:250] Intake/Output this shift:    PE: General- In NAD Lungs-clear Abdomen-soft, incisions clean and intact with some ecchymosis around midline incision, a little dark bloody drainage from incision Lab Results:   Recent Labs  10/17/12 0510 10/18/12 0405  WBC 5.4 5.8  HGB 7.1* 6.9*  HCT 21.7* 21.0*  PLT 211 227   BMET No results found for this basename: NA, K, CL, CO2, GLUCOSE, BUN, CREATININE, CALCIUM,  in the last 72 hours PT/INR No results found for this basename: LABPROT, INR,  in the last 72 hours Comprehensive Metabolic Panel:    Component Value Date/Time   NA 137 10/14/2012 0343   K 3.5 10/14/2012 0343   CL 99 10/14/2012 0343   CO2 29 10/14/2012 0343   BUN 11 10/14/2012 0343   CREATININE 0.62 10/14/2012 0343   GLUCOSE 142* 10/14/2012 0343   CALCIUM 9.2 10/14/2012 0343   AST 12 10/05/2012 1050   ALT 6 10/05/2012 1050   ALKPHOS 54 10/05/2012 1050   BILITOT 0.3 10/05/2012 1050   PROT 7.7 10/05/2012 1050   ALBUMIN 3.5 10/05/2012 1050   Pathology:  T2N0 adenocarcinoma  Studies/Results: No results found.  Anti-infectives: Anti-infectives   Start     Dose/Rate Route Frequency Ordered Stop   10/12/12 1400  cefoTEtan (CEFOTAN) 2 g in dextrose 5 % 50 mL IVPB     2 g 100 mL/hr over 30 Minutes Intravenous Every 12 hours 10/12/12 1120 10/12/12 1237   10/12/12 0504  cefOXitin (MEFOXIN) 2 g in dextrose 5 % 50 mL IVPB     2 g 100 mL/hr over 30 Minutes Intravenous On call to O.R. 10/12/12  0505 10/12/12 0730      Assessment Principal Problem:   T2N0 adenocarcinoma of cecum s/p lap right colectomy-doing well Active Problems:   DM (diabetes mellitus) type 2-good control with SSI   Chronic obstructive pulmonary disease (COPD)   HTN (hypertension)-resolved  Acute on chronic anemia-hgb down to 6.9, most likely from Heparin leading to incisional hematoma and possibly some anastomotic bleeding.    LOS: 6 days   Plan:  Transfuse 2 units PRBCs.  Check hgb tomorrow and if okay will plan on discharge.  Oda Lansdowne J 10/18/2012

## 2012-10-19 ENCOUNTER — Other Ambulatory Visit (INDEPENDENT_AMBULATORY_CARE_PROVIDER_SITE_OTHER): Payer: Self-pay | Admitting: General Surgery

## 2012-10-19 DIAGNOSIS — C182 Malignant neoplasm of ascending colon: Secondary | ICD-10-CM

## 2012-10-19 LAB — CBC
Hemoglobin: 8.9 g/dL — ABNORMAL LOW (ref 13.0–17.0)
MCH: 28.8 pg (ref 26.0–34.0)
MCV: 85.6 fL (ref 78.0–100.0)
RBC: 3.13 MIL/uL — ABNORMAL LOW (ref 4.22–5.81)

## 2012-10-19 LAB — TYPE AND SCREEN
ABO/RH(D): A POS
Antibody Screen: NEGATIVE
Unit division: 0

## 2012-10-19 LAB — GLUCOSE, CAPILLARY

## 2012-10-19 MED ORDER — FERROUS SULFATE 325 (65 FE) MG PO TABS: 325.0000 mg | ORAL_TABLET | Freq: Three times a day (TID) | ORAL | Status: DC

## 2012-10-19 MED ORDER — ACETAMINOPHEN-CODEINE #3 300-30 MG PO TABS: 1.0000 | ORAL_TABLET | ORAL | Status: DC | PRN

## 2012-10-19 NOTE — Progress Notes (Signed)
Assessment unchanged. Pt and wife verbalized understanding of dc instructions. Script x2 given as provided by MD. Introduced to [facility] Chart. Pt stated "I don't have a computer and I don't want one." Pt and wife reported daughter has one and could assist them with signing up. Follow-up appts provided and pt understands to call office to schedule staple removal next week. Discharged via wc to front entrance to meet awaiting vehicle to carry home. Accompanied by NT and wife and nursing student.

## 2012-10-19 NOTE — Care Management Note (Signed)
    Page 1 of 2   10/19/2012     11:03:16 AM   CARE MANAGEMENT NOTE 10/19/2012  Patient:  Alan [name], Alan [name]   Account Number:  [id number]  Date Initiated:  10/12/2012  Documentation initiated by:  DAVIS,RHONDA  Subjective/Objective Assessment:   s/p lap rt hemi-coloectomy, episodes of hypotension iv phenylephrine (NEO-SYNEPHRINE) injection : given in pacu and transferred to sdu for monoriting.     Action/Plan:   home when stable   Anticipated DC Date:  10/19/2012   Anticipated DC Plan:  HOME/SELF CARE  In-house referral  NA      DC Planning Services  NA      PAC Choice  NA   Choice offered to / List presented to:  NA   DME arranged  NA      DME agency  NA     HH arranged  NA      HH agency  NA   Status of service:  Completed, signed off Medicare Important Message given?  NA - LOS <3 / Initial given by admissions (If response is "NO", the following Medicare IM given date fields will be blank) Date Medicare IM given:   Date Additional Medicare IM given:    Discharge Disposition:  HOME/SELF CARE  Per UR Regulation:  Reviewed for med. necessity/level of care/duration of stay  If discussed at Long Length of Stay Meetings, dates discussed:    Comments:  10-18-12 Lorenda Ishihara RN CM 1133 Anemia, hgb 6.9, tranfusing today,plan for d/c in am.  [id number] Earlene Plater, RN, BSN, CCM:  CHART REVIEWED AND UPDATED.  Next chart review due on [id number]. NO DISCHARGE NEEDS PRESENT AT THIS TIME. CASE MANAGEMENT [phone]   [id number] Earlene Plater, RN, BSN, CCM:  CHART REVIEWED AND UPDATED.  Next chart review due on [id number]. NO DISCHARGE NEEDS PRESENT AT THIS TIME. CASE MANAGEMENT [phone]

## 2012-10-19 NOTE — Progress Notes (Signed)
7 Days Post-Op  Subjective: Feels stronger after transfusion Objective: Vital signs in last 24 hours: Temp:  [97.9 F (36.6 C)-99.2 F (37.3 C)] 97.9 F (36.6 C) (04/10 0548) Pulse Rate:  [88-94] 94 (04/10 0548) Resp:  [16-18] 18 (04/10 0548) BP: (102-130)/(42-64) 130/64 mmHg (04/10 0548) SpO2:  [93 %-98 %] 94 % (04/10 0548) Last BM Date: 10/18/12  Intake/Output from previous day: 04/09 0701 - 04/10 0700 In: 1747.1 [P.O.:600; Blood:1147.1] Out: 0  Intake/Output this shift:    PE: General- In NAD Lungs-clear Abdomen-soft, incisions clean and intact with some ecchymosis around midline incision, a little dark bloody drainage from incision Lab Results:   Recent Labs  10/18/12 0405 10/19/12 0415  WBC 5.8 6.5  HGB 6.9* 8.9*  HCT 21.0* 26.8*  PLT 227 243   BMET No results found for this basename: NA, K, CL, CO2, GLUCOSE, BUN, CREATININE, CALCIUM,  in the last 72 hours PT/INR No results found for this basename: LABPROT, INR,  in the last 72 hours Comprehensive Metabolic Panel:    Component Value Date/Time   NA 137 10/14/2012 0343   K 3.5 10/14/2012 0343   CL 99 10/14/2012 0343   CO2 29 10/14/2012 0343   BUN 11 10/14/2012 0343   CREATININE 0.62 10/14/2012 0343   GLUCOSE 142* 10/14/2012 0343   CALCIUM 9.2 10/14/2012 0343   AST 12 10/05/2012 1050   ALT 6 10/05/2012 1050   ALKPHOS 54 10/05/2012 1050   BILITOT 0.3 10/05/2012 1050   PROT 7.7 10/05/2012 1050   ALBUMIN 3.5 10/05/2012 1050   Pathology:  T2N0 adenocarcinoma  Studies/Results: No results found.  Anti-infectives: Anti-infectives   Start     Dose/Rate Route Frequency Ordered Stop   10/12/12 1400  cefoTEtan (CEFOTAN) 2 g in dextrose 5 % 50 mL IVPB     2 g 100 mL/hr over 30 Minutes Intravenous Every 12 hours 10/12/12 1120 10/12/12 1237   10/12/12 0504  cefOXitin (MEFOXIN) 2 g in dextrose 5 % 50 mL IVPB     2 g 100 mL/hr over 30 Minutes Intravenous On call to O.R. 10/12/12 0505 10/12/12 0730      Assessment Principal  Problem:   T2N0 adenocarcinoma of cecum s/p lap right colectomy-continues to do well Active Problems:   DM (diabetes mellitus) type 2-good control with SSI   Chronic obstructive pulmonary disease (COPD)   HTN (hypertension)-resolved  Acute on chronic anemia-hgb up to 8.9 after transfusion    LOS: 7 days   Plan:  Discharge.  Instructions given.  Alan [name] J 10/19/2012

## 2012-10-20 ENCOUNTER — Telehealth: Payer: Self-pay | Admitting: *Deleted

## 2012-10-20 NOTE — Telephone Encounter (Signed)
Spoke with patient and confirmed appointment with Dr. Truett Perna for 11/10/12.

## 2012-10-23 ENCOUNTER — Other Ambulatory Visit: Payer: Self-pay | Admitting: Family Medicine

## 2012-10-23 NOTE — Telephone Encounter (Signed)
Why request?

## 2012-10-23 NOTE — Telephone Encounter (Signed)
kmart sent over request for doxycycline, see below. Chart sent back to you

## 2012-10-24 ENCOUNTER — Other Ambulatory Visit: Payer: Self-pay | Admitting: Family Medicine

## 2012-10-24 DIAGNOSIS — J329 Chronic sinusitis, unspecified: Secondary | ICD-10-CM

## 2012-10-24 MED ORDER — DOXYCYCLINE HYCLATE 100 MG PO TABS: 100.0000 mg | ORAL_TABLET | Freq: Two times a day (BID) | ORAL | Status: DC

## 2012-10-24 NOTE — Telephone Encounter (Signed)
Spoke with pt having sinus problems. Pt recently had surgery --appt for 10-25-12 at 10;40 AM

## 2012-10-25 ENCOUNTER — Ambulatory Visit (INDEPENDENT_AMBULATORY_CARE_PROVIDER_SITE_OTHER): Payer: Medicare Other

## 2012-10-25 ENCOUNTER — Other Ambulatory Visit: Payer: Self-pay | Admitting: Family Medicine

## 2012-10-25 ENCOUNTER — Ambulatory Visit: Payer: Medicare Other

## 2012-10-25 ENCOUNTER — Ambulatory Visit
Admission: RE | Admit: 2012-10-25 | Discharge: 2012-10-25 | Disposition: A | Discharge status: Home / Self Care | Payer: Self-pay | Source: Ambulatory Visit | Attending: Family Medicine | Admitting: Family Medicine

## 2012-10-25 ENCOUNTER — Encounter: Payer: Self-pay | Admitting: Family Medicine

## 2012-10-25 ENCOUNTER — Ambulatory Visit (INDEPENDENT_AMBULATORY_CARE_PROVIDER_SITE_OTHER): Payer: Medicare Other | Admitting: Family Medicine

## 2012-10-25 VITALS — BP 108/62 | HR 65 | Temp 97.4°F | Ht 68.0 in | Wt 165.0 lb

## 2012-10-25 DIAGNOSIS — R0981 Nasal congestion: Secondary | ICD-10-CM

## 2012-10-25 DIAGNOSIS — E1110 Type 2 diabetes mellitus with ketoacidosis without coma: Secondary | ICD-10-CM

## 2012-10-25 DIAGNOSIS — R05 Cough: Secondary | ICD-10-CM

## 2012-10-25 DIAGNOSIS — I10 Essential (primary) hypertension: Secondary | ICD-10-CM

## 2012-10-25 DIAGNOSIS — D371 Neoplasm of uncertain behavior of stomach: Secondary | ICD-10-CM

## 2012-10-25 DIAGNOSIS — J3489 Other specified disorders of nose and nasal sinuses: Secondary | ICD-10-CM

## 2012-10-25 DIAGNOSIS — J449 Chronic obstructive pulmonary disease, unspecified: Secondary | ICD-10-CM

## 2012-10-25 DIAGNOSIS — E1310 Other specified diabetes mellitus with ketoacidosis without coma: Secondary | ICD-10-CM

## 2012-10-25 DIAGNOSIS — J0100 Acute maxillary sinusitis, unspecified: Secondary | ICD-10-CM

## 2012-10-25 DIAGNOSIS — J329 Chronic sinusitis, unspecified: Secondary | ICD-10-CM

## 2012-10-25 DIAGNOSIS — R059 Cough, unspecified: Secondary | ICD-10-CM

## 2012-10-25 DIAGNOSIS — D375 Neoplasm of uncertain behavior of rectum: Secondary | ICD-10-CM

## 2012-10-25 MED ORDER — DOXYCYCLINE HYCLATE 100 MG PO TABS: 100.0000 mg | ORAL_TABLET | Freq: Two times a day (BID) | ORAL | Status: DC

## 2012-10-25 NOTE — Progress Notes (Signed)
Patient ID: Alan [name], male   DOB: 1931-08-06, 79 y.o.   MRN: [id number] SUBJECTIVE: HPI: Congested for:       has not had runny and itchy eyes. has had runny, itchy and stuffy nose. has had Sneezing as well. has had Coughing has not had Wheezing  Medications used for this problem:usual medications. has not had effective response. Right cheek sore. Recently had partial colectomy. Has the inspirometer to use and not used it.  PMH/PSH: reviewed/updated in Epic  SH/FH: reviewed/updated in Epic  Allergies: reviewed/updated in Epic  Medications: reviewed/updated in Epic  Immunizations: reviewed/updated in Epic  ROS: As above in the HPI. All other systems are stable or negative.  OBJECTIVE: APPEARANCE:  Short stature. Obese. Congested. Patient in no acute distress.The patient appeared well nourished and normally developed. Acyanotic. Waist: VITAL SIGNS:BP 108/62  Pulse 65  Temp(Src) 97.4 F (36.3 C) (Oral)  Ht 5\' 8"  (1.727 m)  Wt 165 lb (74.844 kg)  BMI 25.09 kg/m2   SKIN: warm and  Dry without overt rashes, tattoos and scars  HEAD and Neck: without JVD, Head and scalp: normal Eyes:No scleral icterus. Fundi normal, eye movements normal. Ears: Auricle normal, canal normal, Tympanic membranes normal, insufflation normal. Nose: boggy swollen nasal mucosa.tender right maxillary sinus. Throat: normal Neck & thyroid: normal  CHEST & LUNGS: Chest wall: normal Lungs:coarse breath sounds, few crackles in bases. Bilaterally. CVS: Reveals the PMI to be normally located. Regular rhythm, First and Second Heart sounds are normal,  absence of murmurs, rubs or gallops. Peripheral vasculature: Radial pulses: normal  ABDOMEN:  Appearance:post surgical dressings, and hematoma of the the skin. Benign,, no organomegaly, no masses, no Abdominal Aortic enlargement. No Guarding , no rebound. No Bruits. Bowel sounds: normal  RECTAL: N/A GU: N/A  EXTREMETIES:  nonedematous. Both Femoral and Pedal pulses are normal.  MUSCULOSKELETAL:  Spine: normal Joints: intact  NEUROLOGIC: oriented to time,place and person; nonfocal.  ASSESSMENT: Cough - Plan: DG Chest 2 View, CANCELED: DG Chest 2 View  Sinusitis, acute maxillary  Sinusitis - Plan: doxycycline (VIBRA-TABS) 100 MG tablet  HTN (hypertension)  DM (diabetes mellitus) type 2  Chronic obstructive pulmonary disease (COPD)-on home oxygen at night.  Neoplasm of uncertain behavior of cecum   PLAN: Orders Placed This Encounter  Procedures  . DG Chest 2 View    Standing Status: Future     Number of Occurrences: 1     Standing Expiration Date: 12/25/2013    Order Specific Question:  Reason for Exam (SYMPTOM  OR DIAGNOSIS REQUIRED)    Answer:  cough    Order Specific Question:  Preferred imaging location?    Answer:  Internal   No results found for this or any previous visit (from the past 24 hour(s)). Meds ordered this encounter  Medications  . doxycycline (VIBRA-TABS) 100 MG tablet    Sig: Take 1 tablet (100 mg total) by mouth 2 (two) times daily.    Dispense:  20 tablet    Refill:  0  [facility] reading (PRIMARY) by  Dr. Leodis Sias: Preliminary.: right maxillary air fluid level.Cxray: chronic  Changes. RTc in    2 weeks. Patient to use the inspirometer to re=expand the lungs. Keep active, follow up with the surgeon.                           Gunner Iodice P. Modesto Charon, M.D.

## 2012-10-26 ENCOUNTER — Encounter: Payer: Self-pay | Admitting: Surgery

## 2012-10-26 ENCOUNTER — Ambulatory Visit (INDEPENDENT_AMBULATORY_CARE_PROVIDER_SITE_OTHER): Payer: Medicare Other

## 2012-10-26 DIAGNOSIS — Z4802 Encounter for removal of sutures: Secondary | ICD-10-CM

## 2012-10-26 NOTE — Progress Notes (Signed)
Pt is here today for staple removal.  He is currently on antibiotics for a sinus condition which is causing a chronic cough. I told him to be cautious about the coughing because of risk of a hernia. His incision looks fine, with minimal swelling and bruising. Staples were removed and steri strips placed on incision.  Pt has a f/u scheduled with Dr. Abbey Chatters on 11/01/12.

## 2012-10-30 ENCOUNTER — Ambulatory Visit (INDEPENDENT_AMBULATORY_CARE_PROVIDER_SITE_OTHER): Payer: Medicare Other | Admitting: Surgery

## 2012-10-30 ENCOUNTER — Encounter: Payer: Self-pay | Admitting: Surgery

## 2012-10-30 ENCOUNTER — Encounter (INDEPENDENT_AMBULATORY_CARE_PROVIDER_SITE_OTHER): Payer: Medicare Other | Admitting: Vascular Surgery

## 2012-10-30 ENCOUNTER — Other Ambulatory Visit (INDEPENDENT_AMBULATORY_CARE_PROVIDER_SITE_OTHER): Payer: Medicare Other | Admitting: Vascular Surgery

## 2012-10-30 DIAGNOSIS — I739 Peripheral vascular disease, unspecified: Secondary | ICD-10-CM

## 2012-10-30 DIAGNOSIS — Z01810 Encounter for preprocedural cardiovascular examination: Secondary | ICD-10-CM

## 2012-10-30 DIAGNOSIS — I714 Abdominal aortic aneurysm, without rupture: Secondary | ICD-10-CM

## 2012-10-30 DIAGNOSIS — I6529 Occlusion and stenosis of unspecified carotid artery: Secondary | ICD-10-CM

## 2012-10-30 NOTE — Discharge Summary (Signed)
Physician Discharge Summary  Patient ID: Alan [name] MRN: [id number] DOB/AGE: 77/18/1933 77 y.o.  Admit date: 10/12/2012 Discharge date: 10/19/2012  Admission Diagnoses:  Neoplasm of uncertain behavior of the cecum  Discharge Diagnoses:  Principal Problem:   T2N0 adenocarcinoma of the cecum Active Problems:   DM (diabetes mellitus) type 2   Chronic obstructive pulmonary disease (COPD)-on home oxygen at night.   HTN (hypertension)   Discharged Condition: good  Hospital Course: He underwent a laparoscopic-assisted right colectomy and tolerated this fairly well. He had a slow but progressive postoperative course. He had anemia preoperatively. His hemoglobin dropped to 6.9. He was given 2 units of packed red blood cells on his sixth postoperative day and he felt much better. He was also tolerating his diet moving his bowels. By his seventh postoperative day he was ready for discharge. He was given discharge instructions. He will return to the office in approximately one week to have his staples removed.  Consults: None  Significant Diagnostic Studies: none  Treatments: surgery:  Laparoscopic-assisted right colectomy 10/12/2012  Discharge Exam: Blood pressure 128/70, pulse 88, temperature 97.1 F (36.2 C), temperature source Oral, resp. rate 17, height 5\' 8"  (1.727 m), weight 170 lb 13.7 oz (77.5 kg), SpO2 93.00%. \  Disposition: 01-Home or Self Care   Future Appointments Provider Department Dept Phone   10/30/2012 11:30 AM Nada Libman, MD [facility] [phone]   11/01/2012 11:50 AM Adolph Pollack, MD [facility] [place] [phone]   11/07/2012 10:00 AM Ileana Ladd, MD [facility] [phone]   11/10/2012 11:00 AM [facility] MEDICAL ONCOLOGY [phone]   11/10/2012 11:30 AM Ladene Artist, MD [facility] MEDICAL ONCOLOGY [phone]   12/06/2012  11:00 AM Ileana Ladd, MD [facility] MEDICINE [phone]       Medication List    STOP taking these medications       doxycycline 100 MG tablet  Commonly known as:  VIBRA-TABS      TAKE these medications       acetaminophen-codeine 300-30 MG per tablet  Commonly known as:  TYLENOL #3  Take 1-2 tablets by mouth every 4 (four) hours as needed.     albuterol (2.5 MG/3ML) 0.083% nebulizer solution  Commonly known as:  PROVENTIL  Take 2.5 mg by nebulization every 6 (six) hours as needed for wheezing or shortness of breath.     albuterol 108 (90 BASE) MCG/ACT inhaler  Commonly known as:  PROVENTIL HFA;VENTOLIN HFA  Inhale 2 puffs into the lungs every 6 (six) hours as needed for wheezing.     atorvastatin 20 MG tablet  Commonly known as:  LIPITOR  Take 20 mg by mouth every morning.     clobetasol ointment 0.05 %  Commonly known as:  TEMOVATE  Apply 1 application topically 2 (two) times daily. Apply to arms     ferrous sulfate 325 (65 FE) MG tablet  Take 1 tablet (325 mg total) by mouth 3 (three) times daily with meals.     fluticasone 50 MCG/ACT nasal spray  Commonly known as:  FLONASE  Place 2 sprays into the nose daily.     ketoconazole 2 % cream  Commonly known as:  NIZORAL  Apply 1 application topically daily. Apply to face     lisinopril 5 MG tablet  Commonly known as:  PRINIVIL,ZESTRIL  Take 5 mg by mouth every morning.  loratadine 10 MG tablet  Commonly known as:  CLARITIN  Take 10 mg by mouth daily.     metFORMIN 500 MG tablet  Commonly known as:  GLUCOPHAGE  Take 500 mg by mouth 2 (two) times daily with a meal.     SPIRIVA HANDIHALER 18 MCG inhalation capsule  Generic drug:  tiotropium  Place 18 mcg into inhaler and inhale daily.     SYMBICORT 160-4.5 MCG/ACT inhaler  Generic drug:  budesonide-formoterol  Inhale 2 puffs into the lungs 2 (two) times daily.     tamsulosin 0.4 MG Caps  Commonly known as:  FLOMAX  Take 0.4 mg by  mouth daily.         Signed: Adolph Pollack 10/30/2012, 11:10 AM

## 2012-10-30 NOTE — Progress Notes (Signed)
[facility]   Patient name: Alan [name] MRN: [id number] DOB: [birth year] Sex: male     Chief Complaint  Patient presents with  . Re-evaluation    1 month f/u     HISTORY OF PRESENT ILLNESS: The patient comes in today for followup. He has recently undergone a laparoscopic colectomy for a cecal mass. During his workup he was found to have a 6 cm juxtarenal abdominal aortic aneurysm. He has recovered nicely from his colectomy and is now here for further discussions of his aneurysm. He states that his strength continues to improve as does his appetite.  Past Medical History  Diagnosis Date  . DM (diabetes mellitus) type 2 09/18/2012  . AAA (abdominal aortic aneurysm)   . COPD (chronic obstructive pulmonary disease)   . Bronchitis   . Hypertension     borderline   . Shortness of breath     with exertion   . Sleep apnea     sleeps with oxygen at 2.5 L/[place]   . Kidney stones   . Acute sinusitis     10/03/12     Past Surgical History  Procedure Laterality Date  . Scrotum exploration    . Removal of abcess  2006    pt. states abcess was located behind his tonsils  . Laparoscopic partial colectomy N/A 10/12/2012    Procedure: LAPAROSCOPIC PARTIAL COLECTOMY;  Surgeon: Adolph Pollack, MD;  Location: [facility];  Service: General;  Laterality: N/A;    History   Social History  . Marital Status: Married    Spouse Name: N/A    Number of Children: N/A  . Years of Education: N/A   Occupational History  . Not on file.   Social History Main Topics  . Smoking status: Former Smoker    Types: Cigarettes    Quit date: 07/12/2001  . Smokeless tobacco: Former Neurosurgeon    Types: Chew  . Alcohol Use: No  . Drug Use: No  . Sexually Active: No   Other Topics Concern  . Not on file   Social History Narrative  . No narrative on file    Family History  Problem Relation Age of Onset  . Heart disease Father     Allergies as of 10/30/2012 - Review  Complete 10/30/2012  Allergen Reaction Noted  . Sulfa antibiotics Anaphylaxis 09/05/2012  . Amoxicillin Rash 09/18/2012    Current Outpatient Prescriptions on File Prior to Visit  Medication Sig Dispense Refill  . acetaminophen-codeine (TYLENOL #3) 300-30 MG per tablet Take 1-2 tablets by mouth every 4 (four) hours as needed.  30 tablet  1  . albuterol (PROVENTIL HFA;VENTOLIN HFA) 108 (90 BASE) MCG/ACT inhaler Inhale 2 puffs into the lungs every 6 (six) hours as needed for wheezing.      Marland Kitchen albuterol (PROVENTIL) (2.5 MG/3ML) 0.083% nebulizer solution Take 2.5 mg by nebulization every 6 (six) hours as needed for wheezing or shortness of breath.       Marland Kitchen atorvastatin (LIPITOR) 20 MG tablet Take 20 mg by mouth every morning.       . clobetasol ointment (TEMOVATE) 0.05 % Apply 1 application topically 2 (two) times daily. Apply to arms      . doxycycline (VIBRA-TABS) 100 MG tablet Take 1 tablet (100 mg total) by mouth 2 (two) times daily.  20 tablet  0  . ferrous sulfate 325 (65 FE) MG tablet Take 1 tablet (325 mg total) by mouth 3 (three) times daily with  meals.  90 tablet  1  . fluticasone (FLONASE) 50 MCG/ACT nasal spray Place 2 sprays into the nose daily.       Marland Kitchen ketoconazole (NIZORAL) 2 % cream Apply 1 application topically daily. Apply to face      . lisinopril (PRINIVIL,ZESTRIL) 5 MG tablet Take 5 mg by mouth every morning.       . loratadine (CLARITIN) 10 MG tablet Take 10 mg by mouth daily.      . metFORMIN (GLUCOPHAGE) 500 MG tablet Take 500 mg by mouth 2 (two) times daily with a meal.       . SPIRIVA HANDIHALER 18 MCG inhalation capsule Place 18 mcg into inhaler and inhale daily.       . SYMBICORT 160-4.5 MCG/ACT inhaler Inhale 2 puffs into the lungs 2 (two) times daily.       . tamsulosin (FLOMAX) 0.4 MG CAPS Take 0.4 mg by mouth daily.        No current facility-administered medications on file prior to visit.     REVIEW OF SYSTEMS: No changes except was mentioned in the history of  present illness  PHYSICAL EXAMINATION:   Vital signs are BP 132/52  Pulse 79  Ht 5\' 8"  (1.727 m)  Wt 165 lb (74.844 kg)  BMI 25.09 kg/m2  SpO2 100% General: The patient appears their stated age. HEENT:  No gross abnormalities Pulmonary:  Non labored breathing Abdomen: Soft and non-tender Musculoskeletal: There are no major deformities. Neurologic: No focal weakness or paresthesias are detected, Skin: There are no ulcer or rashes noted. Psychiatric: The patient has normal affect. Cardiovascular: There is a regular rate and rhythm without significant murmur appreciated. Palpable pedal pulses   Diagnostic Studies Carotid duplex: No significant carotid stenosis. ABI: 1.1 on the right: 0.3 on the left is approximately 50% stenosis of the distal right superficial femoral artery  Assessment: Juxtarenal abdominal aortic aneurysm Plan: I discussed our plan with the patient and his family. This will be a fenestrated Zenith stent graft. We discussed all potential risks and complications including but not limited to the risk of stroke, renal dysfunction, lower extremity ischemia, bowel ischemia, cardiopulmonary complications. I discussed the possibility of doing this percutaneous versus through a bilateral inguinal incision. I diagrammed out the device and strategies used to get the device in place. I will contact them when the device has been made and we can set an operative date in mid May  V. Charlena Cross, M.D. [facility] [place] Office: [phone] Pager:  [phone]

## 2012-11-01 ENCOUNTER — Encounter (INDEPENDENT_AMBULATORY_CARE_PROVIDER_SITE_OTHER): Payer: Self-pay | Admitting: General Surgery

## 2012-11-01 ENCOUNTER — Ambulatory Visit (INDEPENDENT_AMBULATORY_CARE_PROVIDER_SITE_OTHER): Payer: Medicare Other | Admitting: General Surgery

## 2012-11-01 VITALS — BP 124/80 | HR 70 | Temp 98.2°F | Resp 16 | Ht 68.0 in | Wt 163.8 lb

## 2012-11-01 DIAGNOSIS — Z9889 Other specified postprocedural states: Secondary | ICD-10-CM

## 2012-11-01 NOTE — Patient Instructions (Signed)
Continue light activities. 

## 2012-11-01 NOTE — Progress Notes (Signed)
Procedure:  Laparoscopic right colectomy  Date:  10/12/2012  Pathology:  T2N0 Adenocarcinoma  History:  He is here for a postoperative visit and is doing well. He is eating. Bowel habits are irregular. He saw Dr. Myra Gianotti regarding surgery for his AAA.    Exam: General- Is in NAD. Abdomen-soft, incision is clean and intact with a small amount of ecchymosis. No drainage.  Assessment:  Progressing well post partial colectomy. Outpatient oncology appointment pending.  Plan:  Continue light activities. Return in 3-4 weeks.

## 2012-11-06 ENCOUNTER — Telehealth: Payer: Self-pay | Admitting: Family Medicine

## 2012-11-07 ENCOUNTER — Encounter: Payer: Self-pay | Admitting: Family Medicine

## 2012-11-07 ENCOUNTER — Ambulatory Visit (INDEPENDENT_AMBULATORY_CARE_PROVIDER_SITE_OTHER): Payer: Medicare Other

## 2012-11-07 ENCOUNTER — Ambulatory Visit (INDEPENDENT_AMBULATORY_CARE_PROVIDER_SITE_OTHER): Payer: Medicare Other | Admitting: Family Medicine

## 2012-11-07 ENCOUNTER — Other Ambulatory Visit: Payer: Self-pay | Admitting: Family Medicine

## 2012-11-07 VITALS — BP 132/74 | HR 91 | Temp 97.1°F | Wt 164.4 lb

## 2012-11-07 DIAGNOSIS — J309 Allergic rhinitis, unspecified: Secondary | ICD-10-CM

## 2012-11-07 DIAGNOSIS — J302 Other seasonal allergic rhinitis: Secondary | ICD-10-CM | POA: Insufficient documentation

## 2012-11-07 DIAGNOSIS — J329 Chronic sinusitis, unspecified: Secondary | ICD-10-CM

## 2012-11-07 DIAGNOSIS — J0100 Acute maxillary sinusitis, unspecified: Secondary | ICD-10-CM

## 2012-11-07 MED ORDER — LEVOCETIRIZINE DIHYDROCHLORIDE 5 MG PO TABS: 5.0000 mg | ORAL_TABLET | Freq: Every evening | ORAL | Status: DC

## 2012-11-07 NOTE — Telephone Encounter (Signed)
Pt here for appt.

## 2012-11-07 NOTE — Progress Notes (Signed)
Patient ID: Alan [name], male   DOB: [birth year], 79 y.o.   MRN: [id number] SUBJECTIVE: HPI: Came for  Recheck of sinusitis. Better no pain , but eyes burn and nose congested. Doing well considering his medical problems.  PMH/PSH: reviewed/updated in Epic  SH/FH: reviewed/updated in Epic  Allergies: reviewed/updated in Epic  Medications: reviewed/updated in Epic  Immunizations: reviewed/updated in Epic  ROS: As above in the HPI. All other systems are stable or negative.  OBJECTIVE: APPEARANCE:  Patient in no acute distress.The patient appeared well nourished and normally developed. Acyanotic. Waist: VITAL SIGNS:BP 132/74  Pulse 91  Temp(Src) 97.1 F (36.2 C) (Oral)  Wt 164 lb 6.4 oz (74.571 kg)  BMI 25 kg/m2 Overweight   SKIN: warm and  Dry without overt rashes, tattoos and scars  HEAD and Neck: without JVD, Head and scalp: normal Eyes:No scleral icterus. Fundi normal, eye movements normal. Ears: Auricle normal, canal normal, Tympanic membranes normal, insufflation normal. Nose: rhinitis, rhinorrhea Throat: normal Neck & thyroid: normal Sinuses nontender.  CHEST & LUNGS: Chest wall: normal Lungs: Clear  CVS: Reveals the PMI to be normally located. Regular rhythm, First and Second Heart sounds are normal,  absence of murmurs, rubs or gallops. Peripheral vasculature: Radial pulses: normal Dorsal pedis pulses: normal Posterior pulses: normal  ABDOMEN:  Appearance: normal Benign,, no organomegaly, no masses, no Abdominal Aortic enlargement. No Guarding , no rebound. No Bruits. Bowel sounds: normal  RECTAL: N/A GU: N/A  EXTREMETIES: nonedematous. Both Femoral and Pedal pulses are normal.  MUSCULOSKELETAL:  Spine: normal Joints: intact  NEUROLOGIC: oriented to time,place and person; nonfocal. Strength is normal Sensory is normal Reflexes are normal Cranial Nerves are normal.  ASSESSMENT: Seasonal allergic rhinitis - Plan: levocetirizine  (XYZAL) 5 MG tablet  Sinusitis, acute maxillary - resolved  PLAN: Meds ordered this encounter  Medications  . levocetirizine (XYZAL) 5 MG tablet    Sig: Take 1 tablet (5 mg total) by mouth every evening.    Dispense:  30 tablet    Refill:  3   No orders of the defined types were placed in this encounter.    [facility] reading (PRIMARY) by  Dr. Jamorris Ndiaye:Preliminary: improved right maxillary sinus opacity. With improved aeration.                 Continue with the flonase.  RTc 2 months  Kielan Dreisbach P. Modesto Charon, M.D.

## 2012-11-10 ENCOUNTER — Ambulatory Visit: Payer: Medicare Other

## 2012-11-10 ENCOUNTER — Encounter: Payer: Self-pay | Admitting: Oncology

## 2012-11-10 ENCOUNTER — Telehealth: Payer: Self-pay | Admitting: Emergency Medicine

## 2012-11-10 ENCOUNTER — Ambulatory Visit (HOSPITAL_BASED_OUTPATIENT_CLINIC_OR_DEPARTMENT_OTHER): Payer: Medicare Other | Admitting: Lab

## 2012-11-10 ENCOUNTER — Telehealth: Payer: Self-pay | Admitting: Family Medicine

## 2012-11-10 ENCOUNTER — Ambulatory Visit (HOSPITAL_BASED_OUTPATIENT_CLINIC_OR_DEPARTMENT_OTHER): Payer: Medicare Other | Admitting: Oncology

## 2012-11-10 VITALS — BP 141/70 | HR 91 | Temp 97.8°F | Resp 18 | Ht 65.0 in | Wt 164.2 lb

## 2012-11-10 DIAGNOSIS — J449 Chronic obstructive pulmonary disease, unspecified: Secondary | ICD-10-CM

## 2012-11-10 DIAGNOSIS — C180 Malignant neoplasm of cecum: Secondary | ICD-10-CM

## 2012-11-10 DIAGNOSIS — C189 Malignant neoplasm of colon, unspecified: Secondary | ICD-10-CM

## 2012-11-10 DIAGNOSIS — D649 Anemia, unspecified: Secondary | ICD-10-CM

## 2012-11-10 DIAGNOSIS — R599 Enlarged lymph nodes, unspecified: Secondary | ICD-10-CM

## 2012-11-10 LAB — CBC WITH DIFFERENTIAL/PLATELET
Basophils Absolute: 0 10*3/uL (ref 0.0–0.1)
EOS%: 1.2 % (ref 0.0–7.0)
HGB: 9.6 g/dL — ABNORMAL LOW (ref 13.0–17.1)
MCH: 28.2 pg (ref 27.2–33.4)
NEUT#: 5.8 10*3/uL (ref 1.5–6.5)
RDW: 16.9 % — ABNORMAL HIGH (ref 11.0–14.6)
lymph#: 1.4 10*3/uL (ref 0.9–3.3)

## 2012-11-10 LAB — CEA: CEA: 362.2 ng/mL — ABNORMAL HIGH (ref 0.0–5.0)

## 2012-11-10 NOTE — Telephone Encounter (Signed)
I spoke with Alan [name]. She stated Dr. Truett Perna asked for pt to see RB specifically. On pt CT it showed mediastinal nodes ? Lung CA. She is aware RB is out of the office until Monday and was fine with this for a response back. Please advise Dr. Delton Coombes thanks

## 2012-11-10 NOTE — Progress Notes (Signed)
Checked in new pt with no financial concerns. °

## 2012-11-10 NOTE — Progress Notes (Signed)
[facility] New Patient Consult   Referring MD: Jet Armbrust 79 y.o.  [birth year]    Reason for Referral: Colon cancer     HPI: He was referred to Dr. Ewing Schlein after being diagnosed with anemia and a Hemoccult positive stool. He was taken to a colonoscopy procedure on 09/04/2012. Diverticula were found in the sigmoid colon. Multiple sigmoid, proximal descending, and transverse colon polyps were noted and biopsied. An ulcerated mass was found in the cecum. The pathology from the cecal mass revealed superficial fragments of high-grade glandular dysplasia. The remaining polyps were tubular adenomas with no high-grade dysplasia or malignancy.  He was referred for CTs of the chest, abdomen, and pelvis on 09/26/2012. Abnormal mediastinal adenopathy was noted including a 2.4 Center precarinal node and a 3.2 cm subcarinal node. These were larger compared to a CT from 08/18/2012. CT of the abdomen confirmed an abdominal aortic aneurysm, several small gastrohepatic lymph nodes, diffuse hepatic steatosis and a large cecal mass. Small cecal mesenteric nodes were present. No free fluid or peritoneal disease.  He was referred to Dr. Abbey Chatters and was taken to the operating room for a laparoscopic assisted right colectomy on 10/12/2012. A mass was palpated in the cecum. The pathology ([id number]) is confirmed an invasive well-differentiated adenocarcinoma of the cecum. Tumor invaded into the muscularis propria without definite invasion into underlying soft tissues. Lymphovascular and perineural invasion were not identified. The resection margins were negative. No macroscopic tumor perforation. 17 lymph nodes were negative for metastatic disease. 2 additional Kuebler adenomas were present.  He was anemic prior to surgery and received red cell transfusions while in the hospital.   Past Medical History  Diagnosis Date  . DM (diabetes mellitus) type 2 09/18/2012  . AAA  (abdominal aortic aneurysm)   . COPD (chronic obstructive pulmonary disease)   .  colon cancer (T2 N0) cecum   10/12/2012   . Hypertension     borderline   . Shortness of breath     with exertion   . Sleep apnea     sleeps with oxygen at 2.5 L/[place]   . Kidney stones   . Acute sinusitis     10/03/12     Past Surgical History  Procedure Laterality Date  . Scrotum exploration-reports a right orchiectomy-"epididymitis "   1988   . Removal of abcess  2006    pt. states abcess was located behind his tonsils  . Laparoscopic partial colectomy N/A 10/12/2012    Procedure: LAPAROSCOPIC PARTIAL COLECTOMY;  Surgeon: Adolph Pollack, MD;  Location: [facility];  Service: General;  Laterality: N/A;  .       Family History  Problem Relation Age of Onset  . Heart disease Father    2 brothers, 1 son, 1 daughter, no family history of cancer Current outpatient prescriptions:albuterol (PROVENTIL HFA;VENTOLIN HFA) 108 (90 BASE) MCG/ACT inhaler, Inhale 2 puffs into the lungs every 6 (six) hours as needed for wheezing., Disp: , Rfl: ;  albuterol (PROVENTIL) (2.5 MG/3ML) 0.083% nebulizer solution, Take 2.5 mg by nebulization every 6 (six) hours as needed for wheezing or shortness of breath. , Disp: , Rfl:  atorvastatin (LIPITOR) 20 MG tablet, Take 20 mg by mouth every morning. , Disp: , Rfl: ;  clobetasol ointment (TEMOVATE) 0.05 %, Apply 1 application topically 2 (two) times daily. Apply to arms, Disp: , Rfl: ;  ferrous sulfate 325 (65 FE) MG tablet, Take 1 tablet (325 mg total) by mouth 3 (  three) times daily with meals., Disp: 90 tablet, Rfl: 1;  fluticasone (FLONASE) 50 MCG/ACT nasal spray, Place 2 sprays into the nose daily. , Disp: , Rfl:  ketoconazole (NIZORAL) 2 % cream, Apply 1 application topically daily. Apply to face, Disp: , Rfl: ;  lisinopril (PRINIVIL,ZESTRIL) 5 MG tablet, Take 5 mg by mouth every morning. , Disp: , Rfl: ;  metFORMIN (GLUCOPHAGE) 500 MG tablet, Take 500 mg by mouth 2 (two) times daily  with a meal. , Disp: , Rfl: ;  SPIRIVA HANDIHALER 18 MCG inhalation capsule, Place 18 mcg into inhaler and inhale daily. , Disp: , Rfl:  SYMBICORT 160-4.5 MCG/ACT inhaler, Inhale 2 puffs into the lungs 2 (two) times daily. , Disp: , Rfl: ;  tamsulosin (FLOMAX) 0.4 MG CAPS, Take 0.4 mg by mouth daily. , Disp: , Rfl: ;  [DISCONTINUED] loratadine (CLARITIN) 10 MG tablet, Take 10 mg by mouth daily., Disp: , Rfl:   Allergies:  Allergies  Allergen Reactions  . Sulfa Antibiotics Anaphylaxis  . Xyzal (Levocetirizine) Shortness Of Breath  . Amoxicillin Rash    Social History: He is retired. He worked on Occupational psychologist. He has a history of cigarette use and quit 11 years ago. He does not use alcohol. He received a red cell transfusion during the recent hospital admission for surgery. He was in the [employer] through 1959. No risk factor for HIV or hepatitis.  ROS:   Positives include: 70 pound weight loss since being diagnosed with diabetes last year, exertional dyspnea, "sinus "infection since January of 2014, morning cough  A complete ROS was otherwise negative.  Physical Exam:  Blood pressure 141/70, pulse 91, temperature 97.8 F (36.6 C), temperature source Oral, resp. rate 18, height 5\' 5"  (1.651 m), weight 164 lb 3.2 oz (74.481 kg).  HEENT: Upper denture plate, few remaining lower teeth. Oropharynx without visible mass, neck without mass Lungs: Distant breath sounds with bilateral expiratory wheeze, no respiratory distress Cardiac: Regular rate and rhythm Abdomen: No hepatosplenomegaly, healed surgical incisions, nontender GU: ? Small remaining left testicle  Vascular: Trace low leg edema bilaterally Lymph nodes: No cervical, supra-clavicular, axillary, or inguinal nodes Neurologic: Alert and oriented, the motor exam appears intact in the upper and lower extremities Skin: Pain plaque-like rash at the right greater than left forearm Musculoskeletal: No spine  tenderness   LAB:  CBC  Lab Results  Component Value Date   WBC 7.9 11/10/2012   HGB 9.6* 11/10/2012   HCT 29.0* 11/10/2012   MCV 85.6 11/10/2012   PLT 294 11/10/2012     CMP      Component Value Date/Time   NA 137 10/14/2012 0343   K 3.5 10/14/2012 0343   CL 99 10/14/2012 0343   CO2 29 10/14/2012 0343   GLUCOSE 142* 10/14/2012 0343   BUN 11 10/14/2012 0343   CREATININE 0.62 10/14/2012 0343   CALCIUM 9.2 10/14/2012 0343   PROT 7.7 10/05/2012 1050   ALBUMIN 3.5 10/05/2012 1050   AST 12 10/05/2012 1050   ALT 6 10/05/2012 1050   ALKPHOS 54 10/05/2012 1050   BILITOT 0.3 10/05/2012 1050   GFRNONAA >90 10/14/2012 0343   GFRAA >90 10/14/2012 0343   CEA on 09/18/2012-369.2  Radiology: I reviewed the chest CT from 09/26/2012 with Mr. Alban and his wife. There are multiple enlarged mediastinal nodes.    Assessment/Plan:   1. Stage I (T2 N0) well differentiated adenocarcinoma of the cecum, status post a right colectomy 10/12/2012  2. Markedly elevated preoperative  CEA  3. Abdominal aortic aneurysm  4. Extensive mediastinal lymphadenopathy on a CT the chest 09/26/2012  5. COPD  6. Diabetes  7. Anemia  8. Skin rash over the forearms   Disposition:   He underwent a right colectomy for management of a stage I colon cancer. There is no indication for adjuvant therapy if he does not have metastatic colon cancer. He has an excellent prognosis for a long-term disease-free survival with regard to the stage I colon cancer.  The preoperative CEA was markedly elevated. I am concerned this is not related to the stage I colon cancer. He has extensive mediastinal lymphadenopathy. This would be unusual pattern of tumor spread from an early stage colon cancer. This may represent lung cancer or another malignancy. We obtained a repeat CEA today. Mr. Nutting will be referred to pulmonary medicine for a diagnostic bronchoscopy.  He will return for an office visit here in 3 weeks.  Approximately 60 minutes were  spent with the patient and his wife today. The majority of the time was spent in counseling/coordination of care.  Faelyn Sigler 11/10/2012, 5:21 PM

## 2012-11-10 NOTE — Telephone Encounter (Signed)
Patient was not evaluated for this particular problem today.

## 2012-11-10 NOTE — Telephone Encounter (Signed)
Please advise 

## 2012-11-10 NOTE — Telephone Encounter (Signed)
Patient called back. MMM paged but not available. Please call back

## 2012-11-10 NOTE — Telephone Encounter (Signed)
Stop med RX by dr. Modesto Charon- Tell patient to try claritin or zyrtec OTC

## 2012-11-10 NOTE — Telephone Encounter (Signed)
See visit encounter- Patein came in to be seen

## 2012-11-10 NOTE — Telephone Encounter (Signed)
lmtcb 11/10/12

## 2012-11-10 NOTE — Progress Notes (Signed)
Met with patient and wife.  Explained role of GI navigator.  Resource information given on colon cancer.  Resource sheet with [facility] services provided.  Patient declines need for SW or dietician at this time.  He has a strong support system in place and denies barriers to care at this time.  Will continue to follow.

## 2012-11-11 NOTE — Telephone Encounter (Signed)
Spoke with pt and is doing better.pt stated will bring /call name of medication in when gets home that he thinks caused the problem. Advised pt if shortness or breath proceed to ER. Pt denied any breathing problems at this time.

## 2012-11-11 NOTE — Telephone Encounter (Signed)
Please advise 

## 2012-11-12 DIAGNOSIS — R0602 Shortness of breath: Secondary | ICD-10-CM

## 2012-11-13 NOTE — Telephone Encounter (Signed)
[name] returned call she is aware that appt has been made for pt.Alan [name]

## 2012-11-13 NOTE — Telephone Encounter (Signed)
lmomtcb x1 for gina--appt made for 11/15/12 at 3:30

## 2012-11-13 NOTE — Telephone Encounter (Signed)
Please work him in asap, at the beginning or end of an office schedule; if possible I will arrange to start office early, let me know.

## 2012-11-14 ENCOUNTER — Telehealth: Payer: Self-pay | Admitting: Emergency Medicine

## 2012-11-14 NOTE — Telephone Encounter (Signed)
lmomtcb x1 for gina

## 2012-11-14 NOTE — Telephone Encounter (Signed)
Spoke with Alan [name] from [facility]. She w spoke with patients spouse who is very concerned and wanted to make sure Dr. Delton Coombes was aware of everything going on w [name] requesting Dr. Delton Coombes to please make sure he acknowledges this w [name]:  [name], after reviewing scans it appears [name] (which is why he is being referred here) in the mix of this [name] has a AAA and is to undergo surgery this month to fix this-- this is based on a stent? Per Dr. Myra Gianotti.  I have reassured Alan [name] this information and patients spouse's concerns will be passed and addressed at [facility] tomorrow Will forward to Dr. Delton Coombes as Lorain Childes

## 2012-11-15 ENCOUNTER — Ambulatory Visit (INDEPENDENT_AMBULATORY_CARE_PROVIDER_SITE_OTHER): Payer: Medicare Other | Admitting: Emergency Medicine

## 2012-11-15 ENCOUNTER — Encounter: Payer: Self-pay | Admitting: Emergency Medicine

## 2012-11-15 VITALS — BP 134/70 | HR 90 | Temp 98.9°F | Ht 65.0 in | Wt 162.6 lb

## 2012-11-15 DIAGNOSIS — J449 Chronic obstructive pulmonary disease, unspecified: Secondary | ICD-10-CM

## 2012-11-15 DIAGNOSIS — R590 Localized enlarged lymph nodes: Secondary | ICD-10-CM | POA: Insufficient documentation

## 2012-11-15 DIAGNOSIS — R599 Enlarged lymph nodes, unspecified: Secondary | ICD-10-CM

## 2012-11-15 NOTE — Assessment & Plan Note (Signed)
COPD. No evidence for AE, but he is having more exertional dyspnea. Doubt PE, suspect this is some deconditioning and O2 non-compliance post-colon resection.  - continue BD's - encouraged O2 compliance w all exertion.

## 2012-11-15 NOTE — Progress Notes (Signed)
Subjective:    Patient ID: Alan [name], male    DOB: 20-Jul-1931, 79 y.o.   MRN: [id number]   HPI 78 yo former smoker (90 pk-yrs), COPD, DM, HTN, OSA not on CPAP, colon CA s/p resection 10/2012 and followed by Dr Truett Perna. As part of his staging, CT chest showed enlarging mediastinal LAD, CT abdomen showed a new AAA being eval by Dr Myra Gianotti.  He is referred by Dr Truett Perna to address the mediastinal LAD, ? Colon CA vs some other etiology.   Of note he reports more dyspnea, more cough, non-productive. Occasional wheeze. He has been rx twice with abx since d/c from hosp. He definitely is not to his pre-sgy baseline. No CP, no hemoptysis.    Review of Systems  Constitutional: Negative for fever and unexpected weight change.  HENT: Positive for congestion, postnasal drip and sinus pressure. Negative for ear pain, nosebleeds, sore throat, rhinorrhea, sneezing, trouble swallowing and dental problem.   Eyes: Negative for redness and itching.  Respiratory: Positive for cough, chest tightness, shortness of breath and wheezing.   Cardiovascular: Positive for leg swelling. Negative for palpitations.  Gastrointestinal: Negative for nausea and vomiting.  Genitourinary: Negative for dysuria.  Musculoskeletal: Negative for joint swelling.  Skin: Negative for rash.  Neurological: Negative for headaches.  Hematological: Does not bruise/bleed easily.  Psychiatric/Behavioral: Negative for dysphoric mood. The patient is not nervous/anxious.     Past Medical History  Diagnosis Date  . DM (diabetes mellitus) type 2 09/18/2012  . AAA (abdominal aortic aneurysm)   . COPD (chronic obstructive pulmonary disease)   . Bronchitis   . Hypertension     borderline   . Shortness of breath     with exertion   . Sleep apnea     sleeps with oxygen at 2.5 L/[place]   . Kidney stones   . Acute sinusitis     10/03/12      Family History  Problem Relation Age of Onset  . Heart disease Father   . Heart disease  Mother      History   Social History  . Marital Status: Married    Spouse Name: N/A    Number of Children: 2  . Years of Education: N/A   Occupational History  . retired     Production manager   Social History Main Topics  . Smoking status: Former Smoker -- 1.50 packs/day for 60 years    Types: Cigarettes    Quit date: 07/12/2001  . Smokeless tobacco: Former Neurosurgeon    Types: Chew  . Alcohol Use: No  . Drug Use: No  . Sexually Active: No   Other Topics Concern  . Not on file   Social History Narrative   Married to wife, Lanora Manis   Retired from Insurance account manager   Independent in [employer]     Allergies  Allergen Reactions  . Sulfa Antibiotics Anaphylaxis  . Xyzal (Levocetirizine) Shortness Of Breath  . Pseudoephedrine     stops pt from urinating  . Amoxicillin Rash     Outpatient Prescriptions Prior to Visit  Medication Sig Dispense Refill  . albuterol (PROVENTIL HFA;VENTOLIN HFA) 108 (90 BASE) MCG/ACT inhaler Inhale 2 puffs into the lungs every 6 (six) hours as needed for wheezing.      Marland Kitchen albuterol (PROVENTIL) (2.5 MG/3ML) 0.083% nebulizer solution Take 2.5 mg by nebulization every 6 (six) hours as needed for wheezing or shortness of breath.       Marland Kitchen atorvastatin (  LIPITOR) 20 MG tablet Take 20 mg by mouth every morning.       . clobetasol ointment (TEMOVATE) 0.05 % Apply 1 application topically 2 (two) times daily. Apply to arms      . fluticasone (FLONASE) 50 MCG/ACT nasal spray Place 2 sprays into the nose daily.       Marland Kitchen ketoconazole (NIZORAL) 2 % cream Apply 1 application topically daily. Apply to face      . lisinopril (PRINIVIL,ZESTRIL) 5 MG tablet Take 5 mg by mouth every morning.       . metFORMIN (GLUCOPHAGE) 500 MG tablet Take 500 mg by mouth 2 (two) times daily with a meal.       . SPIRIVA HANDIHALER 18 MCG inhalation capsule Place 18 mcg into inhaler and inhale daily.       . SYMBICORT 160-4.5 MCG/ACT inhaler Inhale 2 puffs into the  lungs 2 (two) times daily.       . tamsulosin (FLOMAX) 0.4 MG CAPS Take 0.4 mg by mouth daily.       . ferrous sulfate 325 (65 FE) MG tablet Take 1 tablet (325 mg total) by mouth 3 (three) times daily with meals.  90 tablet  1   No facility-administered medications prior to visit.        Objective:   Physical Exam Filed Vitals:   11/15/12 1527  BP: 134/70  Pulse: 90  Temp: 98.9 F (37.2 C)  TempSrc: Oral  Height: 5\' 5"  (1.651 m)  Weight: 162 lb 9.6 oz (73.755 kg)  SpO2: 96%   Gen: Pleasant, elderly, overwt, in no distress,  normal affect  ENT: No lesions,  mouth clear,  oropharynx clear, no postnasal drip, some nasopharyngeal erythema  Neck: No JVD, no TMG, no carotid bruits  Lungs: No use of accessory muscles, distant, no wheezes  Cardiovascular: RRR, heart sounds normal, no murmur or gallops, trace peripheral edema  Musculoskeletal: No deformities, no cyanosis or clubbing  Neuro: alert, non focal  Skin: Warm, no lesions or rashes      Assessment & Plan:  Chronic obstructive pulmonary disease (COPD)-on home oxygen at night. COPD. No evidence for AE, but he is having more exertional dyspnea. Doubt PE, suspect this is some deconditioning and O2 non-compliance post-colon resection.  - continue BD's - encouraged O2 compliance w all exertion.   Mediastinal lymphadenopathy - recommend EBUS and bx's. Will need to time this with the AAA stenting that is planned by Dr Myra Gianotti. I think this can be done safely before the stent, but want his opinion before proceeding.

## 2012-11-15 NOTE — Patient Instructions (Addendum)
We will schedule a bronchoscopy with endobronchial ultrasound and biopsies of your chest lymph nodes Dr Delton Coombes will confirm with Dr Myra Gianotti that there are no barriers to doing this procedure before your AAA stent is placed Please continue your current inhaled medications Start using your oxygen at 2L/min with all exertion (walking, etc) Follow with Dr Delton Coombes in 1 month

## 2012-11-15 NOTE — Assessment & Plan Note (Signed)
-   recommend EBUS and bx's. Will need to time this with the AAA stenting that is planned by Dr Myra Gianotti. I think this can be done safely before the stent, but want his opinion before proceeding.

## 2012-11-16 ENCOUNTER — Telehealth: Payer: Self-pay | Admitting: Emergency Medicine

## 2012-11-16 ENCOUNTER — Encounter (HOSPITAL_COMMUNITY): Payer: Self-pay | Admitting: *Deleted

## 2012-11-16 DIAGNOSIS — D649 Anemia, unspecified: Secondary | ICD-10-CM | POA: Insufficient documentation

## 2012-11-16 HISTORY — DX: Anemia, unspecified: D64.9

## 2012-11-16 NOTE — Telephone Encounter (Signed)
Rec'd from [facility] forward 46 pages to [facility]

## 2012-11-17 ENCOUNTER — Other Ambulatory Visit: Payer: Self-pay

## 2012-11-17 ENCOUNTER — Encounter (HOSPITAL_COMMUNITY): Payer: Self-pay | Admitting: Pharmacy Technician

## 2012-11-17 ENCOUNTER — Institutional Professional Consult (permissible substitution): Payer: Medicare Other | Admitting: Internal Medicine

## 2012-11-20 ENCOUNTER — Ambulatory Visit (HOSPITAL_COMMUNITY): Payer: Medicare Other | Admitting: Anesthesiology

## 2012-11-20 ENCOUNTER — Inpatient Hospital Stay (HOSPITAL_COMMUNITY)
Admission: RE | Admit: 2012-11-20 | Discharge: 2012-11-27 | DRG: 166 | Disposition: A | Discharge status: Home Health | Payer: Medicare Other | Source: Ambulatory Visit | Attending: Emergency Medicine | Admitting: Emergency Medicine

## 2012-11-20 ENCOUNTER — Inpatient Hospital Stay (HOSPITAL_COMMUNITY): Payer: Medicare Other

## 2012-11-20 ENCOUNTER — Ambulatory Visit (HOSPITAL_COMMUNITY): Payer: Medicare Other

## 2012-11-20 ENCOUNTER — Ambulatory Visit (HOSPITAL_COMMUNITY)
Admission: RE | Admit: 2012-11-20 | Discharge: 2012-11-20 | Disposition: A | Discharge status: Home / Self Care | Payer: Medicare Other | Source: Ambulatory Visit | Attending: Emergency Medicine | Admitting: Emergency Medicine

## 2012-11-20 ENCOUNTER — Encounter (HOSPITAL_COMMUNITY)
Admission: RE | Disposition: A | Discharge status: Home Health | Payer: Self-pay | Source: Ambulatory Visit | Attending: Emergency Medicine

## 2012-11-20 ENCOUNTER — Encounter (HOSPITAL_COMMUNITY): Payer: Self-pay | Admitting: Anesthesiology

## 2012-11-20 ENCOUNTER — Encounter (HOSPITAL_COMMUNITY): Payer: Self-pay | Admitting: *Deleted

## 2012-11-20 DIAGNOSIS — C180 Malignant neoplasm of cecum: Secondary | ICD-10-CM | POA: Diagnosis present

## 2012-11-20 DIAGNOSIS — I714 Abdominal aortic aneurysm, without rupture, unspecified: Secondary | ICD-10-CM | POA: Diagnosis present

## 2012-11-20 DIAGNOSIS — J9600 Acute respiratory failure, unspecified whether with hypoxia or hypercapnia: Secondary | ICD-10-CM

## 2012-11-20 DIAGNOSIS — J4489 Other specified chronic obstructive pulmonary disease: Secondary | ICD-10-CM

## 2012-11-20 DIAGNOSIS — C189 Malignant neoplasm of colon, unspecified: Secondary | ICD-10-CM

## 2012-11-20 DIAGNOSIS — J449 Chronic obstructive pulmonary disease, unspecified: Secondary | ICD-10-CM

## 2012-11-20 DIAGNOSIS — F411 Generalized anxiety disorder: Secondary | ICD-10-CM | POA: Diagnosis present

## 2012-11-20 DIAGNOSIS — Z9049 Acquired absence of other specified parts of digestive tract: Secondary | ICD-10-CM

## 2012-11-20 DIAGNOSIS — C3402 Malignant neoplasm of left main bronchus: Secondary | ICD-10-CM

## 2012-11-20 DIAGNOSIS — J302 Other seasonal allergic rhinitis: Secondary | ICD-10-CM

## 2012-11-20 DIAGNOSIS — D649 Anemia, unspecified: Secondary | ICD-10-CM | POA: Diagnosis present

## 2012-11-20 DIAGNOSIS — R05 Cough: Secondary | ICD-10-CM | POA: Diagnosis present

## 2012-11-20 DIAGNOSIS — J9801 Acute bronchospasm: Secondary | ICD-10-CM | POA: Diagnosis not present

## 2012-11-20 DIAGNOSIS — E119 Type 2 diabetes mellitus without complications: Secondary | ICD-10-CM | POA: Diagnosis present

## 2012-11-20 DIAGNOSIS — I739 Peripheral vascular disease, unspecified: Secondary | ICD-10-CM

## 2012-11-20 DIAGNOSIS — J0100 Acute maxillary sinusitis, unspecified: Secondary | ICD-10-CM

## 2012-11-20 DIAGNOSIS — G4733 Obstructive sleep apnea (adult) (pediatric): Secondary | ICD-10-CM | POA: Diagnosis present

## 2012-11-20 DIAGNOSIS — C3400 Malignant neoplasm of unspecified main bronchus: Principal | ICD-10-CM | POA: Diagnosis present

## 2012-11-20 DIAGNOSIS — J9620 Acute and chronic respiratory failure, unspecified whether with hypoxia or hypercapnia: Secondary | ICD-10-CM

## 2012-11-20 DIAGNOSIS — E1110 Type 2 diabetes mellitus with ketoacidosis without coma: Secondary | ICD-10-CM | POA: Diagnosis present

## 2012-11-20 DIAGNOSIS — K7689 Other specified diseases of liver: Secondary | ICD-10-CM | POA: Diagnosis present

## 2012-11-20 DIAGNOSIS — R0902 Hypoxemia: Secondary | ICD-10-CM | POA: Diagnosis not present

## 2012-11-20 DIAGNOSIS — J441 Chronic obstructive pulmonary disease with (acute) exacerbation: Secondary | ICD-10-CM

## 2012-11-20 DIAGNOSIS — I10 Essential (primary) hypertension: Secondary | ICD-10-CM | POA: Diagnosis present

## 2012-11-20 DIAGNOSIS — R590 Localized enlarged lymph nodes: Secondary | ICD-10-CM

## 2012-11-20 DIAGNOSIS — K219 Gastro-esophageal reflux disease without esophagitis: Secondary | ICD-10-CM | POA: Diagnosis present

## 2012-11-20 DIAGNOSIS — R059 Cough, unspecified: Secondary | ICD-10-CM | POA: Diagnosis present

## 2012-11-20 DIAGNOSIS — J398 Other specified diseases of upper respiratory tract: Secondary | ICD-10-CM | POA: Diagnosis present

## 2012-11-20 DIAGNOSIS — R599 Enlarged lymph nodes, unspecified: Secondary | ICD-10-CM | POA: Diagnosis present

## 2012-11-20 HISTORY — DX: Anemia, unspecified: D64.9

## 2012-11-20 HISTORY — DX: Personal history of urinary calculi: Z87.442

## 2012-11-20 HISTORY — PX: ENDOBRONCHIAL ULTRASOUND: SHX5096

## 2012-11-20 HISTORY — DX: Malignant (primary) neoplasm, unspecified: C80.1

## 2012-11-20 LAB — BLOOD GAS, ARTERIAL
Acid-Base Excess: 1.2 mmol/L (ref 0.0–2.0)
Acid-Base Excess: 3.2 mmol/L — ABNORMAL HIGH (ref 0.0–2.0)
Bicarbonate: 27.5 mEq/L — ABNORMAL HIGH (ref 20.0–24.0)
FIO2: 1 %
MECHVT: 500 mL
O2 Saturation: 81.9 %
O2 Saturation: 99.8 %
Patient temperature: 98.6
Patient temperature: 98.6
TCO2: 26.1 mmol/L (ref 0–100)
TCO2: 27.1 mmol/L (ref 0–100)
pH, Arterial: 7.317 — ABNORMAL LOW (ref 7.350–7.450)
pH, Arterial: 7.366 (ref 7.350–7.450)

## 2012-11-20 LAB — COMPREHENSIVE METABOLIC PANEL
Albumin: 3.3 g/dL — ABNORMAL LOW (ref 3.5–5.2)
BUN: 8 mg/dL (ref 6–23)
Creatinine, Ser: 0.48 mg/dL — ABNORMAL LOW (ref 0.50–1.35)
Potassium: 3.7 mEq/L (ref 3.5–5.1)
Total Protein: 7.1 g/dL (ref 6.0–8.3)

## 2012-11-20 LAB — CBC
MCH: 27.4 pg (ref 26.0–34.0)
MCV: 87 fL (ref 78.0–100.0)
Platelets: 258 10*3/uL (ref 150–400)
RDW: 15.3 % (ref 11.5–15.5)

## 2012-11-20 LAB — PHOSPHORUS: Phosphorus: 4.3 mg/dL (ref 2.3–4.6)

## 2012-11-20 LAB — LIPASE, BLOOD: Lipase: 8 U/L — ABNORMAL LOW (ref 11–59)

## 2012-11-20 LAB — GLUCOSE, CAPILLARY

## 2012-11-20 LAB — MAGNESIUM: Magnesium: 2 mg/dL (ref 1.5–2.5)

## 2012-11-20 LAB — TROPONIN I: Troponin I: <0.3 ng/mL (ref <0.30)

## 2012-11-20 SURGERY — ENDOBRONCHIAL ULTRASOUND (EBUS)
Anesthesia: General | Laterality: Bilateral | Wound class: Clean Contaminated

## 2012-11-20 SURGERY — Surgical Case
Anesthesia: *Unknown

## 2012-11-20 MED ORDER — FENTANYL BOLUS VIA INFUSION
25.0000 ug | Freq: Four times a day (QID) | INTRAVENOUS | Status: DC | PRN
  Filled 2012-11-20: qty 100

## 2012-11-20 MED ORDER — LEVALBUTEROL HCL 1.25 MG/0.5ML IN NEBU
1.2500 mg | INHALATION_SOLUTION | Freq: Once | RESPIRATORY_TRACT | Status: AC
  Administered 2012-11-20: 1.25 mg via RESPIRATORY_TRACT
  Filled 2012-11-20: qty 0.5

## 2012-11-20 MED ORDER — FENTANYL CITRATE 0.05 MG/ML IJ SOLN
25.0000 ug | INTRAMUSCULAR | Status: DC | PRN
  Filled 2012-11-20: qty 2

## 2012-11-20 MED ORDER — INSULIN ASPART 100 UNIT/ML ~~LOC~~ SOLN
0.0000 [IU] | SUBCUTANEOUS | Status: DC
  Administered 2012-11-20: 2 [IU] via SUBCUTANEOUS
  Administered 2012-11-20: 3 [IU] via SUBCUTANEOUS
  Administered 2012-11-20 – 2012-11-21 (×2): 2 [IU] via SUBCUTANEOUS
  Administered 2012-11-21 (×2): 3 [IU] via SUBCUTANEOUS
  Administered 2012-11-21: 2 [IU] via SUBCUTANEOUS
  Administered 2012-11-21 (×2): 3 [IU] via SUBCUTANEOUS
  Administered 2012-11-22: 5 [IU] via SUBCUTANEOUS
  Administered 2012-11-22 (×2): 3 [IU] via SUBCUTANEOUS
  Filled 2012-11-20: qty 0.15

## 2012-11-20 MED ORDER — LIDOCAINE HCL 4 % MT SOLN
OROMUCOSAL | Status: DC | PRN
  Administered 2012-11-20: 4 mL via TOPICAL

## 2012-11-20 MED ORDER — CHLORHEXIDINE GLUCONATE 0.12 % MT SOLN
15.0000 mL | Freq: Two times a day (BID) | OROMUCOSAL | Status: DC
  Administered 2012-11-20 – 2012-11-24 (×8): 15 mL via OROMUCOSAL
  Filled 2012-11-20 (×10): qty 15

## 2012-11-20 MED ORDER — LIDOCAINE HCL (CARDIAC) 20 MG/ML IV SOLN
INTRAVENOUS | Status: DC | PRN
  Administered 2012-11-20: 50 mg via INTRAVENOUS

## 2012-11-20 MED ORDER — LIDOCAINE HCL (CARDIAC) 20 MG/ML IV SOLN
INTRAVENOUS | Status: AC
  Filled 2012-11-20: qty 5

## 2012-11-20 MED ORDER — SUCCINYLCHOLINE CHLORIDE 20 MG/ML IJ SOLN
INTRAMUSCULAR | Status: AC
  Filled 2012-11-20: qty 1

## 2012-11-20 MED ORDER — ROCURONIUM BROMIDE 50 MG/5ML IV SOLN
INTRAVENOUS | Status: AC
  Filled 2012-11-20: qty 2

## 2012-11-20 MED ORDER — SUCCINYLCHOLINE CHLORIDE 20 MG/ML IJ SOLN
INTRAMUSCULAR | Status: DC | PRN
  Administered 2012-11-20: 100 mg via INTRAVENOUS

## 2012-11-20 MED ORDER — SODIUM CHLORIDE 0.9 % IV SOLN
25.0000 ug/h | INTRAVENOUS | Status: DC
  Administered 2012-11-20: 100 ug/h via INTRAVENOUS
  Administered 2012-11-21: 200 ug/h via INTRAVENOUS
  Administered 2012-11-22: 100 ug/h via INTRAVENOUS
  Filled 2012-11-20 (×4): qty 50

## 2012-11-20 MED ORDER — IPRATROPIUM BROMIDE 0.02 % IN SOLN
0.5000 mg | Freq: Four times a day (QID) | RESPIRATORY_TRACT | Status: DC
  Administered 2012-11-20 – 2012-11-21 (×5): 0.5 mg via RESPIRATORY_TRACT
  Filled 2012-11-20 (×12): qty 2.5

## 2012-11-20 MED ORDER — SODIUM CHLORIDE 0.9 % IV SOLN
INTRAVENOUS | Status: DC
  Administered 2012-11-20 – 2012-11-22 (×3): via INTRAVENOUS

## 2012-11-20 MED ORDER — PANTOPRAZOLE SODIUM 40 MG IV SOLR
40.0000 mg | Freq: Every day | INTRAVENOUS | Status: DC
  Administered 2012-11-20 – 2012-11-21 (×2): 40 mg via INTRAVENOUS
  Filled 2012-11-20 (×3): qty 40

## 2012-11-20 MED ORDER — FENTANYL CITRATE 0.05 MG/ML IJ SOLN: 100.0000 ug | Freq: Once | INTRAMUSCULAR | Status: DC

## 2012-11-20 MED ORDER — BIOTENE DRY MOUTH MT LIQD
15.0000 mL | Freq: Four times a day (QID) | OROMUCOSAL | Status: DC
  Administered 2012-11-21 – 2012-11-27 (×24): 15 mL via OROMUCOSAL

## 2012-11-20 MED ORDER — FENTANYL CITRATE 0.05 MG/ML IJ SOLN
INTRAMUSCULAR | Status: DC | PRN
  Administered 2012-11-20: 25 ug via INTRAVENOUS

## 2012-11-20 MED ORDER — PHENYLEPHRINE HCL 10 MG/ML IJ SOLN
INTRAMUSCULAR | Status: DC | PRN
  Administered 2012-11-20: 80 ug via INTRAVENOUS
  Administered 2012-11-20 (×3): 40 ug via INTRAVENOUS

## 2012-11-20 MED ORDER — PROPOFOL 10 MG/ML IV BOLUS
INTRAVENOUS | Status: DC | PRN
  Administered 2012-11-20: 120 mg via INTRAVENOUS

## 2012-11-20 MED ORDER — ALBUTEROL SULFATE (5 MG/ML) 0.5% IN NEBU
INHALATION_SOLUTION | RESPIRATORY_TRACT | Status: AC
  Filled 2012-11-20: qty 0.5

## 2012-11-20 MED ORDER — ALBUTEROL SULFATE (5 MG/ML) 0.5% IN NEBU
2.5000 mg | INHALATION_SOLUTION | Freq: Once | RESPIRATORY_TRACT | Status: AC
  Administered 2012-11-20: 2.5 mg via RESPIRATORY_TRACT

## 2012-11-20 MED ORDER — LEVOFLOXACIN IN D5W 750 MG/150ML IV SOLN
750.0000 mg | INTRAVENOUS | Status: DC
  Administered 2012-11-20 – 2012-11-23 (×4): 750 mg via INTRAVENOUS
  Filled 2012-11-20 (×7): qty 150

## 2012-11-20 MED ORDER — MIDAZOLAM HCL 2 MG/2ML IJ SOLN
INTRAMUSCULAR | Status: AC
  Administered 2012-11-20: 2 mg
  Filled 2012-11-20: qty 4

## 2012-11-20 MED ORDER — MEPERIDINE HCL 100 MG/ML IJ SOLN: 6.2500 mg | INTRAMUSCULAR | Status: DC | PRN

## 2012-11-20 MED ORDER — PROMETHAZINE HCL 25 MG/ML IJ SOLN: 6.2500 mg | INTRAMUSCULAR | Status: DC | PRN

## 2012-11-20 MED ORDER — ETOMIDATE 2 MG/ML IV SOLN
INTRAVENOUS | Status: AC
  Administered 2012-11-20: 20 mg
  Filled 2012-11-20: qty 20

## 2012-11-20 MED ORDER — LACTATED RINGERS IV SOLN: INTRAVENOUS | Status: DC

## 2012-11-20 MED ORDER — EPINEPHRINE HCL 1 MG/ML IJ SOLN
INTRAMUSCULAR | Status: AC
  Filled 2012-11-20: qty 1

## 2012-11-20 MED ORDER — SODIUM CHLORIDE 0.9 % IV SOLN
1.0000 mg/h | INTRAVENOUS | Status: DC
  Administered 2012-11-20 – 2012-11-21 (×2): 2 mg/h via INTRAVENOUS
  Filled 2012-11-20 (×3): qty 10

## 2012-11-20 MED ORDER — CLOBETASOL PROPIONATE 0.05 % EX OINT
1.0000 "application " | TOPICAL_OINTMENT | Freq: Two times a day (BID) | CUTANEOUS | Status: DC
  Administered 2012-11-21 – 2012-11-27 (×9): 1 via TOPICAL
  Filled 2012-11-20: qty 15

## 2012-11-20 MED ORDER — MIDAZOLAM BOLUS VIA INFUSION
1.0000 mg | INTRAVENOUS | Status: DC | PRN
  Filled 2012-11-20: qty 2

## 2012-11-20 MED ORDER — SODIUM CHLORIDE 0.9 % IV SOLN: 250.0000 mL | INTRAVENOUS | Status: DC | PRN

## 2012-11-20 MED ORDER — ALBUTEROL SULFATE (5 MG/ML) 0.5% IN NEBU
2.5000 mg | INHALATION_SOLUTION | RESPIRATORY_TRACT | Status: DC
  Administered 2012-11-20 – 2012-11-21 (×10): 2.5 mg via RESPIRATORY_TRACT
  Filled 2012-11-20 (×10): qty 0.5

## 2012-11-20 MED ORDER — LEVALBUTEROL HCL 1.25 MG/0.5ML IN NEBU
INHALATION_SOLUTION | RESPIRATORY_TRACT | Status: AC
  Filled 2012-11-20: qty 0.5

## 2012-11-20 MED ORDER — PROPOFOL INFUSION 10 MG/ML OPTIME
INTRAVENOUS | Status: DC | PRN
  Administered 2012-11-20: 25 ug/kg/min via INTRAVENOUS

## 2012-11-20 MED ORDER — METHYLPREDNISOLONE SODIUM SUCC 125 MG IJ SOLR
60.0000 mg | Freq: Four times a day (QID) | INTRAMUSCULAR | Status: DC
  Administered 2012-11-20 – 2012-11-22 (×8): 60 mg via INTRAVENOUS
  Filled 2012-11-20 (×2): qty 0.96
  Filled 2012-11-20: qty 2
  Filled 2012-11-20 (×2): qty 0.96
  Filled 2012-11-20: qty 2
  Filled 2012-11-20 (×4): qty 0.96
  Filled 2012-11-20: qty 2
  Filled 2012-11-20: qty 0.96
  Filled 2012-11-20: qty 2
  Filled 2012-11-20 (×2): qty 0.96
  Filled 2012-11-20: qty 2
  Filled 2012-11-20: qty 0.96
  Filled 2012-11-20 (×2): qty 2

## 2012-11-20 MED ORDER — LACTATED RINGERS IV SOLN
INTRAVENOUS | Status: DC | PRN
  Administered 2012-11-20: 1000 mL via INTRAVENOUS

## 2012-11-20 NOTE — Progress Notes (Signed)
PACU Resp more labored than on arrival. Nods head yes when asked if breathing harder than usual. Dr. Acey Lav aware and in to see. Orders given.

## 2012-11-20 NOTE — H&P (Signed)
PULMONARY  / CRITICAL CARE MEDICINE  Name: Alan [name] MRN: [id number] DOB: 06-21-1932    ADMISSION DATE:  11/20/2012 CONSULTATION DATE:  5/12  PRIMARY SERVICE: PCCM   CHIEF COMPLAINT:  Acute resp failure   BRIEF PATIENT DESCRIPTION:  79 year old male w/ h/o COPD. Presented for scheduled EBUS on 5/12. Pre-op did have some increased SOB c/w prior visit. Underwent procedure. Of notehad sig obstruction of LMSB. Post op had marked bronchospasm, cough and acute resp failure. Failed NIPPV. Admitted to ICU for acute resp failure.  SIGNIFICANT EVENTS / STUDIES:    LINES / TUBES: OETT 5/12>>>  CULTURES: Sputum 5/12>>>  ANTIBIOTICS: levaquin 5/12>>>  PAST MEDICAL HISTORY :  Past Medical History  Diagnosis Date  . DM (diabetes mellitus) type 2 09/18/2012  . AAA (abdominal aortic aneurysm)   . COPD (chronic obstructive pulmonary disease)   . Bronchitis   . Hypertension     borderline   . Shortness of breath     with exertion   . Sleep apnea     sleeps with oxygen at 2.5 L/[place]   . Acute sinusitis     10/03/12   . History of kidney stones     many yrs ago  . Cancer     colon cancer s/p surgery 4'14, skin cancer  . Anemia requiring transfusions 11-16-12    last 10-18-12 s/p Partial colectomy   Past Surgical History  Procedure Laterality Date  . Scrotum exploration    . Removal of abcess  2006    pt. states abcess was located behind his tonsils  . Laparoscopic partial colectomy N/A 10/12/2012    Procedure: LAPAROSCOPIC PARTIAL COLECTOMY;  Surgeon: Adolph Pollack, MD;  Location: [facility]S;  Service: General;  Laterality: N/A;  . Colon surgery    . Abscess removed from throat    . Cataract extraction, bilateral     Prior to Admission medications   Medication Sig Start Date End Date Taking? Authorizing Provider  albuterol (PROVENTIL HFA;VENTOLIN HFA) 108 (90 BASE) MCG/ACT inhaler Inhale 2 puffs into the lungs every 6 (six) hours as needed for wheezing.   Yes Historical Provider,  MD  albuterol (PROVENTIL) (2.5 MG/3ML) 0.083% nebulizer solution Take 2.5 mg by nebulization every 6 (six) hours as needed for wheezing or shortness of breath.  08/04/12  Yes Historical Provider, MD  atorvastatin (LIPITOR) 20 MG tablet Take 20 mg by mouth every morning.  09/04/12  Yes Historical Provider, MD  clobetasol ointment (TEMOVATE) 0.05 % Apply 1 application topically 2 (two) times daily. Apply to arms   Yes Historical Provider, MD  fluticasone (FLONASE) 50 MCG/ACT nasal spray Place 2 sprays into the nose daily.  08/28/12  Yes Historical Provider, MD  ketoconazole (NIZORAL) 2 % cream Apply 1 application topically daily. Apply to face   Yes Historical Provider, MD  lisinopril (PRINIVIL,ZESTRIL) 5 MG tablet Take 5 mg by mouth every morning.  09/02/12  Yes Historical Provider, MD  loratadine (CLARITIN) 10 MG tablet Take 10 mg by mouth daily.   Yes Historical Provider, MD  metFORMIN (GLUCOPHAGE) 500 MG tablet Take 500 mg by mouth 2 (two) times daily with a meal.  09/11/12  Yes Historical Provider, MD  Multiple Vitamin (MULTIVITAMIN WITH MINERALS) TABS Take 1 tablet by mouth daily.   Yes Historical Provider, MD  SPIRIVA HANDIHALER 18 MCG inhalation capsule Place 18 mcg into inhaler and inhale daily.  08/22/12  Yes Historical Provider, MD  SYMBICORT 160-4.5 MCG/ACT inhaler Inhale 2 puffs  into the lungs 2 (two) times daily.  08/28/12  Yes Historical Provider, MD  tamsulosin (FLOMAX) 0.4 MG CAPS Take 0.4 mg by mouth daily.  08/22/12  Yes Historical Provider, MD   Allergies  Allergen Reactions  . Sulfa Antibiotics Anaphylaxis  . Xyzal (Levocetirizine) Shortness Of Breath  . Pseudoephedrine     stops pt from urinating  . Amoxicillin Rash    FAMILY HISTORY:  Family History  Problem Relation Age of Onset  . Heart disease Father   . Heart disease Mother    SOCIAL HISTORY:  reports that he quit smoking about 11 years ago. His smoking use included Cigarettes. He has a 90 pack-year smoking history. He  quit smokeless tobacco use about 11 years ago. His smokeless tobacco use included Chew. He reports that he does not drink alcohol or use illicit drugs.  REVIEW OF SYSTEMS:  Unable. He is in acute distress   SUBJECTIVE:  In acute distress  VITAL SIGNS: Temp:  [97.4 F (36.3 C)-98.6 F (37 C)] 97.4 F (36.3 C) (05/12 1145) Pulse Rate:  [95-115] 108 (05/12 1202) Resp:  [20-32] 21 (05/12 1202) BP: (141-170)/(62-118) 170/118 mmHg (05/12 1202) SpO2:  [82 %-100 %] 100 % (05/12 1202) FiO2 (%):  [100 %] 100 % (05/12 1207) HEMODYNAMICS:   VENTILATOR SETTINGS: Vent Mode:  [-]  FiO2 (%):  [100 %] 100 % INTAKE / OUTPUT: Intake/Output     05/11 0701 - 05/12 0700 05/12 0701 - 05/13 0700   I.V.  900   Total Intake   900   Net   +900          PHYSICAL EXAMINATION: General:  Anxious in acute distress. Not tolerating NIPPV  Neuro:  Awake,alert, oriented but anxious  HEENT:  MMM Cardiovascular:  Tachy rrr  Lungs:  Prolonged exp wheeze, decreased on left  Abdomen:  Soft, not tender  Musculoskeletal:  Intact  Skin:  Intact   LABS:  Recent Labs Lab 11/20/12 1123 11/20/12 1212  HGB  --  9.5*  WBC  --  8.0  PLT  --  258  PHART 7.317*  --   PCO2ART 55.4*  --   PO2ART 53.1*  --     Recent Labs Lab 11/20/12 0835 11/20/12 1057  GLUCAP 112* 158*    CXR: Clear   ASSESSMENT / PLAN:  PULMONARY A: acute on chronic resp failure in setting of AECOPD s/p EBUS.  Per anesthesia was having increased cough prior to admit. He is hypoxic. And apparently has almost complete obstruction of his left main. Multiple bxs were obtained. He has been in marked distress post-op. PCXR clear w/out PTX. Think prob have just exacerbated already present bronchospasm. He has failed NIPPV.  P:   Intubate Ventilate  Scheduled BDs Sedation as needed Steroids Levofloxacin Cultures  CARDIOVASCULAR A: No acute issue  P:  Tele IVFs If more HTN then will treat but for now monitor after sedation is  initiated  RENAL A:  No acute issue P:   IVFs Trend CMP   GASTROINTESTINAL A:  H/o GERD P:   PPI  If unable to extubate by 5/13 then will start TF  HEMATOLOGIC A:  No acute issue  P:  Trend CBC PAS  INFECTIOUS A:  AECOPD  P:   See dashboard  ENDOCRINE A:  DM P:   SSI while on steroids   NEUROLOGIC A:  Acute agitation/anxiety  P:   Sedation protocol   TODAY'S SUMMARY: will intubate and rx for AECOPD  I have personally obtained a history, examined the patient, evaluated laboratory and imaging results, formulated the assessment and plan and placed orders.  CRITICAL CARE: The patient is critically ill with multiple organ systems failure and requires high complexity decision making for assessment and support, frequent evaluation and titration of therapies, application of advanced monitoring technologies and extensive interpretation of multiple databases. Critical Care Time devoted to patient care services described in this note is 35 minutes.   Alyson Reedy, M.D. Pulmonary and Critical Care Medicine [facility] Pager: [phone]

## 2012-11-20 NOTE — Progress Notes (Signed)
PACU  Dr. Cammie Mcgee in to see. Noticeably more short of breath since arrival to PACU O2 Sats down to 92%. Constant cough. Xopenex started.

## 2012-11-20 NOTE — Anesthesia Preprocedure Evaluation (Addendum)
Anesthesia Evaluation  Patient identified by MRN, date of birth, ID band Patient awake    Reviewed: Allergy & Precautions, H&P , NPO status , Patient's Chart, lab work & pertinent test results  Airway Mallampati: II TM Distance: >3 FB Neck ROM: Full    Dental no notable dental hx. (+) Upper Dentures and Lower Dentures   Pulmonary shortness of breath and at rest, sleep apnea, Continuous Positive Airway Pressure Ventilation and Oxygen sleep apnea , COPDformer smoker,    + decreased breath sounds+ wheezing      Cardiovascular Exercise Tolerance: Poor hypertension, Pt. on medications + Peripheral Vascular Disease (AAA 6cm) Rhythm:Regular Rate:Normal     Neuro/Psych negative neurological ROS  negative psych ROS   GI/Hepatic negative GI ROS, Neg liver ROS,   Endo/Other  diabetes  Renal/GU negative Renal ROS  negative genitourinary   Musculoskeletal negative musculoskeletal ROS (+)   Abdominal   Peds negative pediatric ROS (+)  Hematology negative hematology ROS (+)   Anesthesia Other Findings   Reproductive/Obstetrics negative OB ROS                         Anesthesia Physical Anesthesia Plan  ASA: IV  Anesthesia Plan: General   Post-op Pain Management:    Induction: Intravenous  Airway Management Planned: Oral ETT  Additional Equipment:   Intra-op Plan:   Post-operative Plan:   Informed Consent: I have reviewed the patients History and Physical, chart, labs and discussed the procedure including the risks, benefits and alternatives for the proposed anesthesia with the patient or authorized representative who has indicated his/her understanding and acceptance.   Dental advisory given  Plan Discussed with:   Anesthesia Plan Comments: (Risks explained extensively including rupture of AAA with death, and need for post op ventilation.)        Anesthesia Quick Evaluation

## 2012-11-20 NOTE — Procedures (Signed)
Intubation Procedure Note Alan [name] [id number] 09/20/1931  Procedure: Intubation Indications: Respiratory insufficiency  Procedure Details Consent: Risks of procedure as well as the alternatives and risks of each were explained to the (patient/caregiver).  Consent for procedure obtained. Time Out: Verified patient identification, verified procedure, site/side was marked, verified correct patient position, special equipment/implants available, medications/allergies/relevent history reviewed, required imaging and test results available.  Performed  Maximum sterile technique was used including antiseptics, gloves, hand hygiene and mask.  MAC    Evaluation Hemodynamic Status: BP stable throughout; O2 sats: stable throughout Patient's Current Condition: stable Complications: No apparent complications Patient did tolerate procedure well. Chest X-ray ordered to verify placement.  CXR: pending.   Alan [name] 11/20/2012

## 2012-11-20 NOTE — Transfer of Care (Signed)
Immediate Anesthesia Transfer of Care Note  Patient: Alan [name]  Procedure(s) Performed: Procedure(s): ENDOBRONCHIAL ULTRASOUND (Bilateral)  Patient Location: PACU  Anesthesia Type:General  Level of Consciousness: awake, alert  and patient cooperative  Airway & Oxygen Therapy: Patient Spontanous Breathing and Patient connected to face mask oxygen  Post-op Assessment: Report given to PACU RN and Post -op Vital signs reviewed and stable  Post vital signs: Reviewed and stable  Complications: No apparent anesthesia complications

## 2012-11-20 NOTE — H&P (View-Only) (Signed)
Subjective:    Patient ID: Alan [name], male    DOB: 01/23/1932, 79 y.o.   MRN: [id number]   HPI 79 yo former smoker (90 pk-yrs), COPD, DM, HTN, OSA not on CPAP, colon CA s/p resection 10/2012 and followed by Dr Alan [name]. As part of his staging, CT chest showed enlarging mediastinal LAD, CT abdomen showed a new AAA being eval by Dr Alan [name].  He is referred by Dr Alan [name] to address the mediastinal LAD, ? Colon CA vs some other etiology.   Of note he reports more dyspnea, more cough, non-productive. Occasional wheeze. He has been rx twice with abx since d/c from hosp. He definitely is not to his pre-sgy baseline. No CP, no hemoptysis.    Review of Systems  Constitutional: Negative for fever and unexpected weight change.  HENT: Positive for congestion, postnasal drip and sinus pressure. Negative for ear pain, nosebleeds, sore throat, rhinorrhea, sneezing, trouble swallowing and dental problem.   Eyes: Negative for redness and itching.  Respiratory: Positive for cough, chest tightness, shortness of breath and wheezing.   Cardiovascular: Positive for leg swelling. Negative for palpitations.  Gastrointestinal: Negative for nausea and vomiting.  Genitourinary: Negative for dysuria.  Musculoskeletal: Negative for joint swelling.  Skin: Negative for rash.  Neurological: Negative for headaches.  Hematological: Does not bruise/bleed easily.  Psychiatric/Behavioral: Negative for dysphoric mood. The patient is not nervous/anxious.     Past Medical History  Diagnosis Date  . DM (diabetes mellitus) type 2 09/18/2012  . AAA (abdominal aortic aneurysm)   . COPD (chronic obstructive pulmonary disease)   . Bronchitis   . Hypertension     borderline   . Shortness of breath     with exertion   . Sleep apnea     sleeps with oxygen at 2.5 L/[place]   . Kidney stones   . Acute sinusitis     10/03/12      Family History  Problem Relation Age of Onset  . Heart disease Father   . Heart disease  Mother      History   Social History  . Marital Status: Married    Spouse Name: N/A    Number of Children: 2  . Years of Education: N/A   Occupational History  . retired     phone company   Social History Main Topics  . Smoking status: Former Smoker -- 1.50 packs/day for 60 years    Types: Cigarettes    Quit date: 07/12/2001  . Smokeless tobacco: Former User    Types: Chew  . Alcohol Use: No  . Drug Use: No  . Sexually Active: No   Other Topics Concern  . 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 Sulfa Antibiotics Anaphylaxis  . Xyzal (Levocetirizine) Shortness Of Breath  . Pseudoephedrine     stops pt from urinating  . Amoxicillin Rash     Outpatient Prescriptions Prior to Visit  Medication Sig Dispense Refill  . albuterol (PROVENTIL HFA;VENTOLIN HFA) 108 (90 BASE) MCG/ACT inhaler Inhale 2 puffs into the lungs every 6 (six) hours as needed for wheezing.      . albuterol (PROVENTIL) (2.5 MG/3ML) 0.083% nebulizer solution Take 2.5 mg by nebulization every 6 (six) hours as needed for wheezing or shortness of breath.       . atorvastatin (  LIPITOR) 20 MG tablet Take 20 mg by mouth every morning.       . clobetasol ointment (TEMOVATE) 0.05 % Apply 1 application topically 2 (two) times daily. Apply to arms      . fluticasone (FLONASE) 50 MCG/ACT nasal spray Place 2 sprays into the nose daily.       . ketoconazole (NIZORAL) 2 % cream Apply 1 application topically daily. Apply to face      . lisinopril (PRINIVIL,ZESTRIL) 5 MG tablet Take 5 mg by mouth every morning.       . metFORMIN (GLUCOPHAGE) 500 MG tablet Take 500 mg by mouth 2 (two) times daily with a meal.       . SPIRIVA HANDIHALER 18 MCG inhalation capsule Place 18 mcg into inhaler and inhale daily.       . SYMBICORT 160-4.5 MCG/ACT inhaler Inhale 2 puffs into the  lungs 2 (two) times daily.       . tamsulosin (FLOMAX) 0.4 MG CAPS Take 0.4 mg by mouth daily.       . ferrous sulfate 325 (65 FE) MG tablet Take 1 tablet (325 mg total) by mouth 3 (three) times daily with meals.  90 tablet  1   No facility-administered medications prior to visit.        Objective:   Physical Exam Filed Vitals:   11/15/12 1527  BP: 134/70  Pulse: 90  Temp: 98.9 F (37.2 C)  TempSrc: Oral  Height: 5' 5" (1.651 m)  Weight: 162 lb 9.6 oz (73.755 kg)  SpO2: 96%   Gen: Pleasant, elderly, overwt, in no distress,  normal affect  ENT: No lesions,  mouth clear,  oropharynx clear, no postnasal drip, some nasopharyngeal erythema  Neck: No JVD, no TMG, no carotid bruits  Lungs: No use of accessory muscles, distant, no wheezes  Cardiovascular: RRR, heart sounds normal, no murmur or gallops, trace peripheral edema  Musculoskeletal: No deformities, no cyanosis or clubbing  Neuro: alert, non focal  Skin: Warm, no lesions or rashes      Assessment & Plan:  Chronic obstructive pulmonary disease (COPD)-on home oxygen at night. COPD. No evidence for AE, but he is having more exertional dyspnea. Doubt PE, suspect this is some deconditioning and O2 non-compliance post-colon resection.  - continue BD's - encouraged O2 compliance w all exertion.   Mediastinal lymphadenopathy - recommend EBUS and bx's. Will need to time this with the AAA stenting that is planned by Dr Alan [name]. I think this can be done safely before the stent, but want his opinion before proceeding.     

## 2012-11-20 NOTE — Progress Notes (Signed)
PACU Dr. Acey Lav in to see. Aware of CXR . ABG still pending. Cindee Lame NP for Critical Care Medicine in to see. Transferred to ICU.

## 2012-11-20 NOTE — Progress Notes (Signed)
PACU Xopenex finished. Less coughing but remains short of breath. Sitting in High fowlers position. O2 sat continues to decline 87% on 10L simple mask. ABG pending.

## 2012-11-20 NOTE — Interval H&P Note (Signed)
PCCM Interval Hx:   Pt presents for EBUS. He is c/o more exertional dyspnea compared with our initial meeting last week. Still with his baseline dry cough. He appears to have dyspnea at rest as well based on eval today. He is receiving nebulized albuterol currently. States that he has been compliant with his O2 since our visit last week.  Filed Vitals:   11/20/12 0830  BP: 143/74  Pulse: 95  Temp: 97.7 F (36.5 C)  TempSrc: Oral  SpO2: 99%   Gen: Pleasant, elderly man, in no distress,  normal affect, receiving neb  ENT: No lesions,  mouth clear,  oropharynx clear, no postnasal drip  Neck: No JVD, no TMG, no carotid bruits  Lungs: No use of accessory muscles, a bit distant but clear without rales or rhonchi, no wheeze  Cardiovascular: RRR, heart sounds normal, no murmur or gallops, trace peripheral edema  Musculoskeletal: No deformities, no cyanosis or clubbing  Neuro: alert, non focal  Skin: Warm, no lesions or rashes

## 2012-11-20 NOTE — H&P (Addendum)
PULMONARY  / CRITICAL CARE MEDICINE  Name: Alan [name] MRN: [id number] DOB: 1932-01-15    ADMISSION DATE:  11/20/2012 CONSULTATION DATE:  5/12  PRIMARY SERVICE: PCCM   CHIEF COMPLAINT:  Acute resp failure   BRIEF PATIENT DESCRIPTION:  79 year old male w/ h/o COPD. Presented for scheduled EBUS on 5/12. Pre-op did have some increased SOB c/w prior visit. Underwent procedure. Of notehad sig obstruction of LMSB. Post op had marked bronchospasm, cough and acute resp failure. Failed NIPPV. Admitted to ICU for acute resp failure.  SIGNIFICANT EVENTS / STUDIES:    LINES / TUBES: OETT 5/12>>>  CULTURES: Sputum 5/12>>>  ANTIBIOTICS: levaquin 5/12>>>  PAST MEDICAL HISTORY :  Past Medical History  Diagnosis Date  . DM (diabetes mellitus) type 2 09/18/2012  . AAA (abdominal aortic aneurysm)   . COPD (chronic obstructive pulmonary disease)   . Bronchitis   . Hypertension     borderline   . Shortness of breath     with exertion   . Sleep apnea     sleeps with oxygen at 2.5 L/[place]   . Acute sinusitis     10/03/12   . History of kidney stones     many yrs ago  . Cancer     colon cancer s/p surgery 4'14, skin cancer  . Anemia requiring transfusions 11-16-12    last 10-18-12 s/p Partial colectomy   Past Surgical History  Procedure Laterality Date  . Scrotum exploration    . Removal of abcess  2006    pt. states abcess was located behind his tonsils  . Laparoscopic partial colectomy N/A 10/12/2012    Procedure: LAPAROSCOPIC PARTIAL COLECTOMY;  Surgeon: Adolph Pollack, MD;  Location: [facility];  Service: General;  Laterality: N/A;  . Colon surgery    . Abscess removed from throat    . Cataract extraction, bilateral     Prior to Admission medications   Medication Sig Start Date End Date Taking? Authorizing Provider  albuterol (PROVENTIL HFA;VENTOLIN HFA) 108 (90 BASE) MCG/ACT inhaler Inhale 2 puffs into the lungs every 6 (six) hours as needed for wheezing.   Yes Historical Provider,  MD  albuterol (PROVENTIL) (2.5 MG/3ML) 0.083% nebulizer solution Take 2.5 mg by nebulization every 6 (six) hours as needed for wheezing or shortness of breath.  08/04/12  Yes Historical Provider, MD  atorvastatin (LIPITOR) 20 MG tablet Take 20 mg by mouth every morning.  09/04/12  Yes Historical Provider, MD  clobetasol ointment (TEMOVATE) 0.05 % Apply 1 application topically 2 (two) times daily. Apply to arms   Yes Historical Provider, MD  fluticasone (FLONASE) 50 MCG/ACT nasal spray Place 2 sprays into the nose daily.  08/28/12  Yes Historical Provider, MD  ketoconazole (NIZORAL) 2 % cream Apply 1 application topically daily. Apply to face   Yes Historical Provider, MD  lisinopril (PRINIVIL,ZESTRIL) 5 MG tablet Take 5 mg by mouth every morning.  09/02/12  Yes Historical Provider, MD  loratadine (CLARITIN) 10 MG tablet Take 10 mg by mouth daily.   Yes Historical Provider, MD  metFORMIN (GLUCOPHAGE) 500 MG tablet Take 500 mg by mouth 2 (two) times daily with a meal.  09/11/12  Yes Historical Provider, MD  Multiple Vitamin (MULTIVITAMIN WITH MINERALS) TABS Take 1 tablet by mouth daily.   Yes Historical Provider, MD  SPIRIVA HANDIHALER 18 MCG inhalation capsule Place 18 mcg into inhaler and inhale daily.  08/22/12  Yes Historical Provider, MD  SYMBICORT 160-4.5 MCG/ACT inhaler Inhale 2 puffs  into the lungs 2 (two) times daily.  08/28/12  Yes Historical Provider, MD  tamsulosin (FLOMAX) 0.4 MG CAPS Take 0.4 mg by mouth daily.  08/22/12  Yes Historical Provider, MD   Allergies  Allergen Reactions  . Sulfa Antibiotics Anaphylaxis  . Xyzal (Levocetirizine) Shortness Of Breath  . Pseudoephedrine     stops pt from urinating  . Amoxicillin Rash    FAMILY HISTORY:  Family History  Problem Relation Age of Onset  . Heart disease Father   . Heart disease Mother    SOCIAL HISTORY:  reports that he quit smoking about 11 years ago. His smoking use included Cigarettes. He has a 90 pack-year smoking history. He  quit smokeless tobacco use about 11 years ago. His smokeless tobacco use included Chew. He reports that he does not drink alcohol or use illicit drugs.  REVIEW OF SYSTEMS:  Unable. He is in acute distress   SUBJECTIVE:  In acute distress  VITAL SIGNS: Temp:  [97.4 F (36.3 C)-98.6 F (37 C)] 97.4 F (36.3 C) (05/12 1145) Pulse Rate:  [95-115] 108 (05/12 1202) Resp:  [20-32] 21 (05/12 1202) BP: (141-170)/(62-118) 170/118 mmHg (05/12 1202) SpO2:  [82 %-100 %] 100 % (05/12 1202) FiO2 (%):  [100 %] 100 % (05/12 1207) HEMODYNAMICS:   VENTILATOR SETTINGS: Vent Mode:  [-]  FiO2 (%):  [100 %] 100 % INTAKE / OUTPUT: Intake/Output     05/11 0701 - 05/12 0700 05/12 0701 - 05/13 0700   I.V.  900   Total Intake   900   Net   +900          PHYSICAL EXAMINATION: General:  Anxious in acute distress. Not tolerating NIPPV  Neuro:  Awake,alert, oriented but anxious  HEENT:  MMM Cardiovascular:  Tachy rrr  Lungs:  Prolonged exp wheeze, decreased on left  Abdomen:  Soft, not tender  Musculoskeletal:  Intact  Skin:  Intact   LABS:  Recent Labs Lab 11/20/12 1123  PHART 7.317*  PCO2ART 55.4*  PO2ART 53.1*    Recent Labs Lab 11/20/12 0835 11/20/12 1057  GLUCAP 112* 158*    CXR: Clear   ASSESSMENT / PLAN:  PULMONARY A: acute on chronic resp failure in setting of AECOPD s/p EBUS.  Per anesthesia was having increased cough prior to admit. He is hypoxic. And apparently has almost complete obstruction of his left main. Multiple bxs were obtained. He has been in marked distress post-op. PCXR clear w/out PTX. Think prob have just exacerbated already present bronchospasm. He has failed NIPPV.  P:   Intubate Ventilate  Scheduled BDs  CARDIOVASCULAR A: No acute issue  P:  Tele IVFs  RENAL A:  No acute issue P:   IVFs Trend CMP   GASTROINTESTINAL A:  H/o GERD P:   PPI   HEMATOLOGIC A:  No acute issue  P:  Trend CBC PAS  INFECTIOUS A:  AECOPD  P:   See  dashboard  ENDOCRINE A:  DM P:   ssi while on steroids   NEUROLOGIC A:  Acute agitation/anxiety  P:   Sedation protocol   TODAY'S SUMMARY: will intubate and rx for AE-COPD, goal extubate soon after   I have personally obtained a history, examined the patient, evaluated laboratory and imaging results, formulated the assessment and plan and placed orders.   Levy Pupa, MD, PhD 11/20/2012, 12:54 PM [facility] [phone] or if no answer [phone]

## 2012-11-20 NOTE — Anesthesia Postprocedure Evaluation (Signed)
  Anesthesia Post-op Note  Patient: Alan [name]  Procedure(s) Performed: Procedure(s) (LRB): ENDOBRONCHIAL ULTRASOUND (Bilateral)  Patient Location: PACU  Anesthesia Type: General  Level of Consciousness: awake and alert   Airway and Oxygen Therapy: Patient Spontanous Breathing. Tachepnic   Post-op Pain: mild  Post-op Assessment: Post-op Vital signs reviewed, Patient's Cardiovascular Status Stable, Respiratory Function Unstable, Patent Airway and No signs of Nausea or vomiting  Last Vitals:  Filed Vitals:   11/20/12 1202  BP: 170/118  Pulse: 108  Temp:   Resp: 21    Post-op Vital Signs: Sats 81% on FM. Labored breathing. Pulmonologist and NP aware.   Complications: No apparent anesthesia complications. Admit to ICU on Bipap.

## 2012-11-20 NOTE — Preoperative (Signed)
Beta Blockers   Reason not to administer Beta Blockers:Not Applicable 

## 2012-11-20 NOTE — Op Note (Signed)
Video Bronchoscopy with Endobronchial Ultrasound Procedure Note  Date of Operation: 11/20/2012  Pre-op Diagnosis: mediastinal adenopathy  Post-op Diagnosis: same  Surgeon: Levy Pupa  Assistants: none  Anesthesia: General endotracheal anesthesia  Operation: Flexible video fiberoptic bronchoscopy with endobronchial ultrasound and biopsies.  Estimated Blood Loss: 20cc  Complications: none apparent  Indications and History: Alan [name] is a 79 y.o. male with hx tobacco and COPD, recently dx with colon CA s/p resection. He is under evaluation for multiple enlarged mediastinal nodes vs masses. Recommendation was made for needle bx by EBUS.  The risks, benefits, complications, treatment options and expected outcomes were discussed with the patient.  The possibilities of pneumothorax, pneumonia, reaction to medication, pulmonary aspiration, perforation of a viscus, bleeding, failure to diagnose a condition and creating a complication requiring transfusion or operation were discussed with the patient who freely signed the consent.    Description of Procedure: The patient was examined in the preoperative area and history and data from the preprocedure consultation were reviewed. It was deemed appropriate to proceed.  The patient was taken to [facility], identified as Alan [name] and the procedure verified as Flexible Video Fiberoptic Bronchoscopy.  A Time Out was held and the above information confirmed. General anesthesia was initiated and the patient  was orally intubated. The video fiberoptic bronchoscope was introduced via the endotracheal tube and a general inspection was performed which showed some external compression of the trachea with rightward shift. The R main and R lobar airways were slightly thickened and edematous but were otherwise normal. L distal L mainstem was compressed and fish-mouthed by a protruding medial wall. There appeared to be a friable  endobronchial lesion just beneath the mucosal surface. The scope would move distal to the L main lesion and revealed normal LUL and LLL airways. Forceps biopsies, Wang needle bx's and brushings were performed on the L mainstem mass. After manipulation with instruments, it was clear that this was a friable lesion. The standard scope was then withdrawn and the endobronchial ultrasound was used to identify and characterize the peritracheal, hilar and bronchial lymph nodes. Inspection showed enlargement of multiple nodes, particularly 7 and 4L. Using real-time ultrasound guidance Wang needle biopsies were take from Station 7, 4L, 4R, 2R nodes and were sent for cytology. Clear slides from Station 7 were also prepared to be sen for fungal and AFB staining. The patient tolerated the procedure well without apparent complications. There was no significant blood loss. The bronchoscope was withdrawn. Anesthesia was reversed and the patient was taken to the PACU for recovery.   Samples: 1. Wang needle biopsies from 7 node 2. Wang needle biopsies from 4R node 3. Wang needle biopsies from 4L node 4. Wang needle biopsies from 2L node 5. Wang needle biopsies from L mainstem mass 6. Endobronchial forceps bx from L mainstem mass 7. Endobronchial brushings from L mainstem mass 8. Bronchial washings L mainstem   Plans:  The patient will be discharged from the PACU to home when recovered from anesthesia. We will review the cytology, pathology and microbiology results with the patient when they become available. Outpatient followup will be with Dr Rockey Situ.   Levy Pupa, MD, PhD 11/20/2012, 11:00 AM [facility] [phone] or if no answer [phone]

## 2012-11-20 NOTE — OR Nursing (Signed)
4cc 1:10000 epi injected via  fna by Dr Delton Coombes

## 2012-11-20 NOTE — Interval H&P Note (Signed)
Plans:  He has significant (undertreated) COPD at baseline, acute on chronic exertional dyspnea. Has higher risk for anesthesia, but benefits outweigh these risks. Will proceed with mediastinal nodal sampling to dx his lesions.    Levy Pupa, MD, PhD 11/20/2012, 9:13 AM [facility] [phone] or if no answer [phone]

## 2012-11-21 ENCOUNTER — Encounter (HOSPITAL_COMMUNITY): Payer: Self-pay | Admitting: Emergency Medicine

## 2012-11-21 ENCOUNTER — Inpatient Hospital Stay (HOSPITAL_COMMUNITY): Payer: Medicare Other

## 2012-11-21 DIAGNOSIS — E1310 Other specified diabetes mellitus with ketoacidosis without coma: Secondary | ICD-10-CM

## 2012-11-21 DIAGNOSIS — I10 Essential (primary) hypertension: Secondary | ICD-10-CM

## 2012-11-21 LAB — FUNGAL STAIN: Fungal Smear: NONE SEEN

## 2012-11-21 LAB — CBC
Hemoglobin: 9.5 g/dL — ABNORMAL LOW (ref 13.0–17.0)
MCH: 27.1 pg (ref 26.0–34.0)
MCV: 86.6 fL (ref 78.0–100.0)
RBC: 3.5 MIL/uL — ABNORMAL LOW (ref 4.22–5.81)
WBC: 7.2 10*3/uL (ref 4.0–10.5)

## 2012-11-21 LAB — BASIC METABOLIC PANEL
CO2: 29 mEq/L (ref 19–32)
Chloride: 99 mEq/L (ref 96–112)
Glucose, Bld: 162 mg/dL — ABNORMAL HIGH (ref 70–99)
Potassium: 4.2 mEq/L (ref 3.5–5.1)
Sodium: 135 mEq/L (ref 135–145)

## 2012-11-21 LAB — GLUCOSE, CAPILLARY
Glucose-Capillary: 152 mg/dL — ABNORMAL HIGH (ref 70–99)
Glucose-Capillary: 169 mg/dL — ABNORMAL HIGH (ref 70–99)
Glucose-Capillary: 146 mg/dL — ABNORMAL HIGH (ref 70–99)

## 2012-11-21 LAB — AFB STAIN: Acid Fast Smear: NONE SEEN

## 2012-11-21 MED ORDER — OSMOLITE 1.2 CAL PO LIQD
1000.0000 mL | ORAL | Status: DC
  Administered 2012-11-21: 1000 mL

## 2012-11-21 MED ORDER — PRO-STAT SUGAR FREE PO LIQD
30.0000 mL | Freq: Two times a day (BID) | ORAL | Status: DC
  Administered 2012-11-21 (×2): 30 mL
  Filled 2012-11-21 (×4): qty 30

## 2012-11-21 MED ORDER — FREE WATER
30.0000 mL | Status: DC
  Administered 2012-11-21 – 2012-11-22 (×6): 30 mL

## 2012-11-21 MED ORDER — IPRATROPIUM BROMIDE 0.02 % IN SOLN
0.5000 mg | Freq: Four times a day (QID) | RESPIRATORY_TRACT | Status: DC
  Administered 2012-11-21 – 2012-11-24 (×12): 0.5 mg via RESPIRATORY_TRACT
  Filled 2012-11-21 (×11): qty 2.5

## 2012-11-21 MED ORDER — ALBUTEROL SULFATE (5 MG/ML) 0.5% IN NEBU
2.5000 mg | INHALATION_SOLUTION | Freq: Four times a day (QID) | RESPIRATORY_TRACT | Status: DC
  Administered 2012-11-21 – 2012-11-24 (×12): 2.5 mg via RESPIRATORY_TRACT
  Filled 2012-11-21 (×10): qty 0.5

## 2012-11-21 NOTE — Plan of Care (Signed)
Problem: Consults Goal: Skin Care Protocol Initiated - if Braden Score 18 or less If consults are not indicated, leave blank or document N/A Outcome: Completed/Met Date Met:  11/20/12 Q2hr Turn, heel elevated off bed, pillow between knees

## 2012-11-21 NOTE — Plan of Care (Signed)
Problem: Phase I Progression Outcomes Goal: VTE prophylaxis Outcome: Completed/Met Date Met:  11/21/12 SCDs

## 2012-11-21 NOTE — Progress Notes (Signed)
CARE MANAGEMENT NOTE 11/21/2012  Patient:  ZALYN, AMEND   Account Number:  [id number]  Date Initiated:  11/21/2012  Documentation initiated by:  Havoc Sanluis  Subjective/Objective Assessment:   pt had procedure done in or expirenced  bronchospasm and required intubation     Action/Plan:   home when stable   Anticipated DC Date:  11/24/2012   Anticipated DC Plan:  HOME/SELF CARE  In-house referral  NA      DC Planning Services  NA      [facility] Choice  NA   Choice offered to / List presented to:  NA   DME arranged  NA      DME agency  NA     HH arranged  NA      HH agency  NA   Status of service:  In process, will continue to follow Medicare Important Message given?  NA - LOS <3 / Initial given by admissions (If response is "NO", the following Medicare IM given date fields will be blank) Date Medicare IM given:   Date Additional Medicare IM given:    Discharge Disposition:    Per UR Regulation:  Reviewed for med. necessity/level of care/duration of stay  If discussed at Long Length of Stay Meetings, dates discussed:    Comments:  [id number] Earlene Plater, RN, BSN, CCM:  CHART REVIEWED AND UPDATED.  Next chart review due on [id number]. NO DISCHARGE NEEDS PRESENT AT THIS TIME. CASE MANAGEMENT [phone]

## 2012-11-21 NOTE — Plan of Care (Signed)
Problem: Phase I Progression Outcomes Goal: GIProphysixis Outcome: Completed/Met Date Met:  11/20/12 Protonix IV

## 2012-11-21 NOTE — Progress Notes (Signed)
PULMONARY  / CRITICAL CARE MEDICINE  Name: Alan [name] MRN: [id number] DOB: 1932-05-17    ADMISSION DATE:  11/20/2012 CONSULTATION DATE:  5/12  PRIMARY SERVICE: PCCM   CHIEF COMPLAINT:  Acute resp failure   BRIEF PATIENT DESCRIPTION:  79 year old male w/ h/o COPD. Presented for scheduled EBUS on 5/12. Pre-op did have some increased SOB c/w prior visit. Underwent procedure. Of notehad sig obstruction of LMSB. Post op had marked bronchospasm, cough and acute resp failure. Failed NIPPV. Admitted to ICU for acute resp failure.  SIGNIFICANT EVENTS / STUDIES:    LINES / TUBES: OETT 5/12>>>  CULTURES: Sputum 5/12>>>  ANTIBIOTICS: levaquin 5/12>>>  SUBJECTIVE:  Awake and interactive, comfortable.  VITAL SIGNS: Temp:  [96.3 F (35.7 C)-98.6 F (37 C)] 97.8 F (36.6 C) (05/13 0400) Pulse Rate:  [66-115] 80 (05/13 0801) Resp:  [8-59] 16 (05/13 0801) BP: (98-185)/(48-118) 157/70 mmHg (05/13 0801) SpO2:  [82 %-100 %] 99 % (05/13 0801) FiO2 (%):  [40 %-100 %] 40 % (05/13 0801) Weight:  [74.9 kg (165 lb 2 oz)] 74.9 kg (165 lb 2 oz) (05/13 0400) HEMODYNAMICS:   VENTILATOR SETTINGS: Vent Mode:  [-] PSV FiO2 (%):  [40 %-100 %] 40 % Set Rate:  [14 bmp] 14 bmp Vt Set:  [500 mL] 500 mL PEEP:  [5 cmH20] 5 cmH20 Pressure Support:  [5 cmH20] 5 cmH20 Plateau Pressure:  [14 cmH20-18 cmH20] 14 cmH20 INTAKE / OUTPUT: Intake/Output     05/12 0701 - 05/13 0700 05/13 0701 - 05/14 0700   I.V. (mL/kg) 2521.3 (33.7) 97 (1.3)   IV Piggyback 150    Total Intake(mL/kg) 2671.3 (35.7) 97 (1.3)   Urine (mL/kg/hr) 830 50 (0.3)   Total Output 830 50   Net +1841.3 +47        Stool Occurrence       PHYSICAL EXAMINATION: General: Calm, comfortable, intubated. Neuro: Awake,alert, moves all ext to command. HEENT: [place]/AT, PERRL, EOM-I and -LAN/thyromegally. Cardiovascular:  Tachy rrr. Lungs: Decreased BS on left otherwise improved end exp wheezes. Abdomen:  Soft, not tender, ND and +BS.   Musculoskeletal:  Intact. Skin:  Intact.  LABS:  Recent Labs Lab 11/20/12 1123 11/20/12 1212 11/20/12 1330 11/20/12 1723 11/20/12 2319 11/21/12 0346  HGB  --  9.5*  --   --   --  9.5*  WBC  --  8.0  --   --   --  7.2  PLT  --  258  --   --   --  259  NA  --  137  --   --   --  135  K  --  3.7  --   --   --  4.2  CL  --  98  --   --   --  99  CO2  --  30  --   --   --  29  GLUCOSE  --  172*  --   --   --  162*  BUN  --  8  --   --   --  14  CREATININE  --  0.48*  --   --   --  0.46*  CALCIUM  --  9.1  --   --   --  9.1  MG  --  2.0  --   --   --   --   PHOS  --  4.3  --   --   --   --   AST  --  11  --   --   --   --   ALT  --  6  --   --   --   --   ALKPHOS  --  56  --   --   --   --   BILITOT  --  0.3  --   --   --   --   PROT  --  7.1  --   --   --   --   ALBUMIN  --  3.3*  --   --   --   --   APTT  --  37  --   --   --   --   INR  --  1.09  --   --   --   --   TROPONINI  --  <0.30  --  <0.30 <0.30  --   PHART 7.317*  --  7.366  --   --   --   PCO2ART 55.4*  --  51.1*  --   --   --   PO2ART 53.1*  --  447.0*  --   --   --    Recent Labs Lab 11/20/12 1611 11/20/12 1958 11/21/12 0023 11/21/12 0345 11/21/12 0755  GLUCAP 144* 152* 169* 152* 146*   CXR: Clear   ASSESSMENT / PLAN:  PULMONARY A: acute on chronic resp failure in setting of AECOPD s/p EBUS.  Per anesthesia was having increased cough prior to admit. He is hypoxic. And apparently has almost complete obstruction of his left main. Multiple bxs were obtained. He has been in marked distress post-op. PCXR clear w/out PTX. Think prob have just exacerbated already present bronchospasm. He has failed NIPPV.  P:   - Begin PS trials but no extubation until wheezing improves and allow more time for inflammation within the airway to improve, anticipate in AM. - Scheduled BDs - Sedation as needed, will switch to PRN. - Steroids as ordered, will begin taper once off vent and wheezing improves. - Levofloxacin  IV. - Cultures pending.  CARDIOVASCULAR A: No acute issue  P:  - Tele. - Continue IVF at 75 ml/hr given UOP. - BP very sensitive to sedation, care as to not drop BP with sedation discussed.  RENAL A:  Low UOP. P:   - IVFs. - Trend BMP. - Replace electrolytes as needed.  GASTROINTESTINAL A:  H/o GERD P:   - PPI. - Consult nutrition for TF.  HEMATOLOGIC A:  No acute issue  P:  - Trend CBC. - PAS.  INFECTIOUS A:  AECOPD  P:   - See dashboard.  ENDOCRINE A:  DM P:   - SSI while on steroids.  NEUROLOGIC A:  Acute agitation/anxiety  P:   - Sedation as ordered.  TODAY'S SUMMARY: Maintain on PS (10/5 for now), will allow more time for inflammation to improve and wheezing to improves then will SBT in AM with the hope to extubte and that left sided air movement improves.  I have personally obtained a history, examined the patient, evaluated laboratory and imaging results, formulated the assessment and plan and placed orders.  CRITICAL CARE: The patient is critically ill with multiple organ systems failure and requires high complexity decision making for assessment and support, frequent evaluation and titration of therapies, application of advanced monitoring technologies and extensive interpretation of multiple databases. Critical Care Time devoted to patient care services described in this note is 35 minutes.   Alyson Reedy, M.D.  Pulmonary and Critical Care Medicine [facility] Pager: [phone]

## 2012-11-21 NOTE — Plan of Care (Signed)
Problem: Phase I Progression Outcomes Goal: VTE prophylaxis Outcome: Completed/Met Date Met:  11/21/12  Bilateral lower legs SCDs

## 2012-11-21 NOTE — Progress Notes (Signed)
INITIAL NUTRITION ASSESSMENT  DOCUMENTATION CODES Per approved criteria  -Not Applicable   INTERVENTION: Initiate Osmolite 1.2 @ 10 ml/hr via OG tube and increase by 10 ml every 4 hours to goal rate of 45 ml/hr. 30 ml Prostat BID.  At goal rate, tube feeding regimen will provide 1496 kcal, 90 grams of protein, and 886 ml of H2O.  Provide 30 ml free water flushes q 4 hours.  NUTRITION DIAGNOSIS: Inadequate oral intake related to inability to eat as evidenced by NPO status and pt on Vent.   Goal: Pt to meet >/= 90% of their estimated nutrition needs  Monitor:  TF initiation/tolerance/rate Vent status Weight Labs Bowel function  Reason for Assessment: Consult for Tube feeding initiation/management  79 y.o. male  Admitting Dx: Acute Respiratory failure  ASSESSMENT: 79 year old male w/ h/o COPD. Presented for scheduled EBUS on 5/12. Pre-op did have some increased SOB c/w prior visit. Underwent procedure. Of notehad sig obstruction of LMSB. Post op had marked bronchospasm, cough and acute resp failure. Failed NIPPV. Admitted to ICU for acute resp failure.  No family present in room to obtain further history. Per weight history in chart, pt has maintained weight for past month at 165 lbs.   Height: Ht Readings from Last 1 Encounters:  11/21/12 5\' 5"  (1.651 m)    Weight: Wt Readings from Last 1 Encounters:  11/21/12 165 lb 2 oz (74.9 kg)    Ideal Body Weight: 136 lbs  % Ideal Body Weight: 121%  Wt Readings from Last 10 Encounters:  11/21/12 165 lb 2 oz (74.9 kg)  11/21/12 165 lb 2 oz (74.9 kg)  11/15/12 162 lb 9.6 oz (73.755 kg)  11/10/12 164 lb 3.2 oz (74.481 kg)  11/07/12 164 lb 6.4 oz (74.571 kg)  11/01/12 163 lb 12.8 oz (74.299 kg)  10/30/12 165 lb (74.844 kg)  10/25/12 165 lb (74.844 kg)  10/15/12 170 lb 13.7 oz (77.5 kg)  10/15/12 170 lb 13.7 oz (77.5 kg)    Usual Body Weight: unknown  % Usual Body Weight: NA  BMI:  Body mass index is 27.48  kg/(m^2).  Patient is currently intubated on ventilator support.  MV: 8 Temp:Temp (24hrs), Avg:97.5 F (36.4 C), Min:96.3 F (35.7 C), Max:98.4 F (36.9 C)   Estimated Nutritional Needs: Kcal: 1538 Protein: 93-112 grams Fluid: 2.3 L  Skin: intact; +1 generalized edema, +2 RLE and LLE edema  Diet Order: NPO  EDUCATION NEEDS: -No education needs identified at this time   Intake/Output Summary (Last 24 hours) at 11/21/12 1056 Last data filed at 11/21/12 0800  Gross per 24 hour  Intake 1968.25 ml  Output    880 ml  Net 1088.25 ml    Last BM: PTA  Labs:   Recent Labs Lab 11/20/12 1212 11/21/12 0346  NA 137 135  K 3.7 4.2  CL 98 99  CO2 30 29  BUN 8 14  CREATININE 0.48* 0.46*  CALCIUM 9.1 9.1  MG 2.0  --   PHOS 4.3  --   GLUCOSE 172* 162*    CBG (last 3)   Recent Labs  11/21/12 0023 11/21/12 0345 11/21/12 0755  GLUCAP 169* 152* 146*    Scheduled Meds: . albuterol  2.5 mg Nebulization Q3H  . antiseptic oral rinse  15 mL Mouth Rinse QID  . chlorhexidine  15 mL Mouth Rinse BID  . clobetasol ointment  1 application Topical BID  . fentaNYL  100 mcg Intravenous Once  . insulin aspart  0-15 Units Subcutaneous Q4H  . ipratropium  0.5 mg Nebulization Q6H  . levofloxacin (LEVAQUIN) IV  750 mg Intravenous Q24H  . methylPREDNISolone (SOLU-MEDROL) injection  60 mg Intravenous Q6H  . pantoprazole (PROTONIX) IV  40 mg Intravenous QHS    Continuous Infusions: . sodium chloride 75 mL/hr at 11/21/12 0130  . fentaNYL infusion INTRAVENOUS Stopped (11/21/12 0800)  . midazolam (VERSED) infusion Stopped (11/21/12 0800)    Past Medical History  Diagnosis Date  . DM (diabetes mellitus) type 2 09/18/2012  . AAA (abdominal aortic aneurysm)   . COPD (chronic obstructive pulmonary disease)   . Bronchitis   . Hypertension     borderline   . Shortness of breath     with exertion   . Sleep apnea     sleeps with oxygen at 2.5 L/[place]   . Acute sinusitis     10/03/12    . History of kidney stones     many yrs ago  . Cancer     colon cancer s/p surgery 4'14, skin cancer  . Anemia requiring transfusions 11-16-12    last 10-18-12 s/p Partial colectomy    Past Surgical History  Procedure Laterality Date  . Scrotum exploration    . Removal of abcess  2006    pt. states abcess was located behind his tonsils  . Laparoscopic partial colectomy N/A 10/12/2012    Procedure: LAPAROSCOPIC PARTIAL COLECTOMY;  Surgeon: Adolph Pollack, MD;  Location: [facility];  Service: General;  Laterality: N/A;  . Colon surgery    . Abscess removed from throat    . Cataract extraction, bilateral    . Endobronchial ultrasound Bilateral 11/20/2012    Procedure: ENDOBRONCHIAL ULTRASOUND;  Surgeon: Leslye Peer, MD;  Location: [facility];  Service: Cardiopulmonary;  Laterality: Bilateral;    Ian Malkin RD, LDN Inpatient Clinical Dietitian Pager: [phone] After Hours Pager: [phone]

## 2012-11-21 NOTE — Plan of Care (Signed)
Problem: Phase I Progression Outcomes Goal: Sedation Protocol initiated if indicated Outcome: Completed/Met Date Met:  11/20/12 Versed and Fentanyl

## 2012-11-21 NOTE — Plan of Care (Signed)
Problem: Consults Goal: Diabetes Guidelines if Diabetic/Glucose > 140 If diabetic or lab glucose is > 140 mg/dl - Initiate Diabetes/Hyperglycemia Guidelines & Document Interventions  Outcome: Completed/Met Date Met:  11/21/12 Q4hr CBG

## 2012-11-22 ENCOUNTER — Inpatient Hospital Stay (HOSPITAL_COMMUNITY): Payer: Medicare Other

## 2012-11-22 ENCOUNTER — Other Ambulatory Visit: Payer: Self-pay

## 2012-11-22 DIAGNOSIS — R059 Cough, unspecified: Secondary | ICD-10-CM

## 2012-11-22 DIAGNOSIS — R05 Cough: Secondary | ICD-10-CM

## 2012-11-22 LAB — GLUCOSE, CAPILLARY
Glucose-Capillary: 203 mg/dL — ABNORMAL HIGH (ref 70–99)
Glucose-Capillary: 191 mg/dL — ABNORMAL HIGH (ref 70–99)
Glucose-Capillary: 165 mg/dL — ABNORMAL HIGH (ref 70–99)

## 2012-11-22 LAB — BASIC METABOLIC PANEL
BUN: 16 mg/dL (ref 6–23)
Creatinine, Ser: 0.45 mg/dL — ABNORMAL LOW (ref 0.50–1.35)
GFR calc Af Amer: >90 mL/min (ref >90)
GFR calc non Af Amer: >90 mL/min (ref >90)

## 2012-11-22 LAB — BLOOD GAS, ARTERIAL
Drawn by: 317871
O2 Saturation: 92.4 %
PEEP: 5 cmH2O
RATE: 14 resp/min
pCO2 arterial: 43.7 mmHg (ref 35.0–45.0)
pO2, Arterial: 65.2 mmHg — ABNORMAL LOW (ref 80.0–100.0)

## 2012-11-22 LAB — CBC
HCT: 29.9 % — ABNORMAL LOW (ref 39.0–52.0)
MCHC: 31.4 g/dL (ref 30.0–36.0)
MCV: 86.7 fL (ref 78.0–100.0)
RDW: 15.6 % — ABNORMAL HIGH (ref 11.5–15.5)

## 2012-11-22 MED ORDER — PANTOPRAZOLE SODIUM 40 MG PO TBEC
40.0000 mg | DELAYED_RELEASE_TABLET | Freq: Two times a day (BID) | ORAL | Status: DC
  Administered 2012-11-23 – 2012-11-27 (×9): 40 mg via ORAL
  Filled 2012-11-22 (×12): qty 1

## 2012-11-22 MED ORDER — FUROSEMIDE 10 MG/ML IJ SOLN
40.0000 mg | Freq: Once | INTRAMUSCULAR | Status: AC
  Administered 2012-11-22: 40 mg via INTRAVENOUS
  Filled 2012-11-22: qty 4

## 2012-11-22 MED ORDER — HYDRALAZINE HCL 20 MG/ML IJ SOLN
10.0000 mg | INTRAMUSCULAR | Status: DC | PRN
  Administered 2012-11-22 – 2012-11-23 (×3): 10 mg via INTRAVENOUS
  Filled 2012-11-22 (×3): qty 1

## 2012-11-22 MED ORDER — MAGNESIUM SULFATE 40 MG/ML IJ SOLN
2.0000 g | Freq: Once | INTRAMUSCULAR | Status: AC
  Administered 2012-11-22: 2 g via INTRAVENOUS
  Filled 2012-11-22: qty 50

## 2012-11-22 MED ORDER — MORPHINE SULFATE 4 MG/ML IJ SOLN
4.0000 mg | Freq: Once | INTRAMUSCULAR | Status: AC
  Administered 2012-11-22: 4 mg via INTRAVENOUS
  Filled 2012-11-22: qty 1

## 2012-11-22 MED ORDER — CLONIDINE HCL 0.1 MG PO TABS
0.1000 mg | ORAL_TABLET | Freq: Three times a day (TID) | ORAL | Status: DC
  Administered 2012-11-22 (×2): 0.1 mg via ORAL
  Filled 2012-11-22 (×6): qty 1

## 2012-11-22 MED ORDER — ALBUTEROL SULFATE (5 MG/ML) 0.5% IN NEBU
2.5000 mg | INHALATION_SOLUTION | RESPIRATORY_TRACT | Status: DC | PRN
  Administered 2012-11-23 – 2012-11-26 (×3): 2.5 mg via RESPIRATORY_TRACT
  Filled 2012-11-22 (×5): qty 0.5

## 2012-11-22 MED ORDER — METHYLPREDNISOLONE SODIUM SUCC 125 MG IJ SOLR
125.0000 mg | Freq: Four times a day (QID) | INTRAMUSCULAR | Status: DC
  Administered 2012-11-22 – 2012-11-23 (×3): 125 mg via INTRAVENOUS
  Filled 2012-11-22 (×7): qty 2

## 2012-11-22 MED ORDER — METHYLPREDNISOLONE SODIUM SUCC 40 MG IJ SOLR: 40.0000 mg | Freq: Two times a day (BID) | INTRAMUSCULAR | Status: DC

## 2012-11-22 MED ORDER — INSULIN ASPART 100 UNIT/ML ~~LOC~~ SOLN
0.0000 [IU] | Freq: Three times a day (TID) | SUBCUTANEOUS | Status: DC
  Administered 2012-11-22: 3 [IU] via SUBCUTANEOUS
  Administered 2012-11-23: 5 [IU] via SUBCUTANEOUS
  Administered 2012-11-23: 3 [IU] via SUBCUTANEOUS
  Administered 2012-11-23: 8 [IU] via SUBCUTANEOUS
  Administered 2012-11-24: 3 [IU] via SUBCUTANEOUS
  Administered 2012-11-24: 5 [IU] via SUBCUTANEOUS

## 2012-11-22 NOTE — Procedures (Signed)
Extubation Procedure Note  Patient Details:   Name: Alan [name] DOB: 1931/12/05 MRN: [id number]   Airway Documentation:  Patient was extubated per MD order. Patient was placed on a nasal cannula initially but was only saturating 85% due to being a mouth breather. Patient was then placed on a venti mask at 50% and he is tolerating it well with an Spo2-92%. BBS-Equal with wheezes. No stridor or signs of distress noted. Positive leak test noted. Patient had weaned on his SBT well this morning with no complications. Patient is stable currently and RT will continue to monitor.   Evaluation O2 sats: 92% Complications: Low Spo2 on nasal cannula due to mouth breathing. Placed on a venti mask and tolerating it well.  Patient did tolerate procedure well. Bilateral Breath Sounds: Expiratory wheezes Suctioning: Airway   Alla Feeling 11/22/2012, 8:42 AM

## 2012-11-22 NOTE — Progress Notes (Addendum)
PULMONARY  / CRITICAL CARE MEDICINE  Name: Alan [name] MRN: [id number] DOB: 05/24/1932    ADMISSION DATE:  11/20/2012 CONSULTATION DATE:  5/12  PRIMARY SERVICE: PCCM   CHIEF COMPLAINT:  Acute resp failure   BRIEF PATIENT DESCRIPTION:  79 year old male w/ h/o COPD. Presented for scheduled EBUS on 5/12. Pre-op did have some increased SOB c/w prior visit. Underwent procedure. Of notehad sig obstruction of LMSB. Post op had marked bronchospasm, cough and acute resp failure. Failed NIPPV. Admitted to ICU for acute resp failure.  SIGNIFICANT EVENTS / STUDIES:  Cytology: 5/12: C/w adeno  LINES / TUBES: OETT 5/12>>>5/14  CULTURES: Sputum 5/12>>>  ANTIBIOTICS: levaquin 5/12>>>  SUBJECTIVE:  Feels better.   VITAL SIGNS: Temp:  [97 F (36.1 C)-99.7 F (37.6 C)] 97 F (36.1 C) (05/14 0800) Pulse Rate:  [81-112] 109 (05/14 0849) Resp:  [12-28] 25 (05/14 0849) BP: (115-188)/(52-88) 184/88 mmHg (05/14 0849) SpO2:  [90 %-100 %] 90 % (05/14 0849) FiO2 (%):  [40 %-50 %] 50 % (05/14 0849) Weight:  [75.5 kg (166 lb 7.2 oz)] 75.5 kg (166 lb 7.2 oz) (05/14 0700) HEMODYNAMICS:   VENTILATOR SETTINGS: Vent Mode:  [-] CPAP;PSV FiO2 (%):  [40 %-50 %] 50 % Set Rate:  [14 bmp] 14 bmp Vt Set:  [500 mL] 500 mL PEEP:  [5 cmH20] 5 cmH20 Pressure Support:  [5 cmH20-8 cmH20] 5 cmH20 Plateau Pressure:  [11 cmH20-25 cmH20] 17 cmH20 INTAKE / OUTPUT: Intake/Output     05/13 0701 - 05/14 0700 05/14 0701 - 05/15 0700   I.V. (mL/kg) 1951 (25.8) 87 (1.2)   NG/GT 650 40   IV Piggyback 150    Total Intake(mL/kg) 2751 (36.4) 127 (1.7)   Urine (mL/kg/hr) 955 (0.5)    Total Output 955     Net +1796 +127          PHYSICAL EXAMINATION: General: Calm, comfortable, no distress  Neuro: Awake,alert, moves all ext to command. HEENT: [place]/AT, PERRL, EOM-I and -LAN/thyromegally. Cardiovascular:  Tachy rrr. Lungs: improved air movement. Prolonged exp wheeze. Decreased on left,but improved.  Abdomen:   Soft, not tender, ND and +BS.  Musculoskeletal:  Intact. Skin:  Intact.  LABS:  Recent Labs Lab 11/20/12 1123  11/20/12 1212 11/20/12 1330 11/20/12 1723 11/20/12 2319 11/21/12 0346 11/22/12 0325 11/22/12 0445  HGB  --   --  9.5*  --   --   --  9.5* 9.4*  --   WBC  --   --  8.0  --   --   --  7.2 14.8*  --   PLT  --   --  258  --   --   --  259 292  --   NA  --   --  137  --   --   --  135 136  --   K  --   < > 3.7  --   --   --  4.2 3.9  --   CL  --   --  98  --   --   --  99 101  --   CO2  --   --  30  --   --   --  29 28  --   GLUCOSE  --   --  172*  --   --   --  162* 203*  --   BUN  --   --  8  --   --   --  14 16  --   CREATININE  --   --  0.48*  --   --   --  0.46* 0.45*  --   CALCIUM  --   --  9.1  --   --   --  9.1 9.1  --   MG  --   --  2.0  --   --   --   --  2.1  --   PHOS  --   --  4.3  --   --   --   --  2.7  --   AST  --   --  11  --   --   --   --   --   --   ALT  --   --  6  --   --   --   --   --   --   ALKPHOS  --   --  56  --   --   --   --   --   --   BILITOT  --   --  0.3  --   --   --   --   --   --   PROT  --   --  7.1  --   --   --   --   --   --   ALBUMIN  --   --  3.3*  --   --   --   --   --   --   APTT  --   --  37  --   --   --   --   --   --   INR  --   --  1.09  --   --   --   --   --   --   TROPONINI  --   --  <0.30  --  <0.30 <0.30  --   --   --   PHART 7.317*  --   --  7.366  --   --   --   --  7.415  PCO2ART 55.4*  --   --  51.1*  --   --   --   --  43.7  PO2ART 53.1*  --   --  447.0*  --   --   --   --  65.2*  < > = values in this interval not displayed.  Recent Labs Lab 11/21/12 1556 11/21/12 2004 11/21/12 2343 11/22/12 0426 11/22/12 0825  GLUCAP 173* 185* 199* 203* 191*   CXR: Clear   ASSESSMENT / PLAN:  PULMONARY A: acute on chronic resp failure in setting of AECOPD s/p EBUS.  Clinically better. Extubated this am  P:   -wean FIO2 - Scheduled BDs - Steroids as ordered, will begin taper once off vent and wheezing  improves. - Levofloxacin IV. - Cultures pending.  CARDIOVASCULAR A: No acute issue  P:  - Tele. - decrease IVFs  RENAL A:  Low UOP. P:   - IVFs. - Trend BMP. - Replace electrolytes as needed.  GASTROINTESTINAL A:  H/o GERD P:   Advance diet   HEMATOLOGIC A:  Adenocarcinoma of lung P:  - will call hme/onc   INFECTIOUS A:  AECOPD  P:   - See dashboard.  ENDOCRINE A:  DM P:   - SSI while on steroids.  NEUROLOGIC A:  Acute agitation/anxiety (resolved) P:   Decrease sedation   TODAY'S SUMMARY: extubated today.  Path positive for adeno. Will call heme/onc   CC time 35 min.  Alyson Reedy, M.D. [facility] Pulmonary/Critical Care Medicine. Pager: [phone]. After hours pager: [phone].

## 2012-11-22 NOTE — Progress Notes (Signed)
PULMONARY  / CRITICAL CARE MEDICINE  Name: Alan [name] MRN: [id number] DOB: [birth year]    ADMISSION DATE:  11/20/2012 CONSULTATION DATE:  5/12  PRIMARY SERVICE: PCCM   CHIEF COMPLAINT:  Acute resp failure   BRIEF PATIENT DESCRIPTION:  79 year old male w/ h/o COPD. Presented for scheduled EBUS on 5/12. Pre-op did have some increased SOB c/w prior visit. Underwent procedure. Of notehad sig obstruction of LMSB. Post op had marked bronchospasm, cough and acute resp failure. Failed NIPPV. Admitted to ICU for acute resp failure.   SIGNIFICANT EVENTS / STUDIES:  Cytology: 5/12: C/w adeno Resp distress pm 5/14 p extubation  LINES / TUBES: OETT 5/12>>>5/14  CULTURES: Sputum 5/12>>>  ANTIBIOTICS: levaquin 5/12>>>  SUBJECTIVE:  5 word sob at rest  VITAL SIGNS: Temp:  [97 F (36.1 C)-99.7 F (37.6 C)] 97.8 F (36.6 C) (05/14 1200) Pulse Rate:  [81-109] 98 (05/14 1600) Resp:  [12-25] 24 (05/14 1600) BP: (115-198)/(52-98) 197/98 mmHg (05/14 1636) SpO2:  [88 %-100 %] 97 % (05/14 1600) FiO2 (%):  [40 %-50 %] 40 % (05/14 1600) Weight:  [166 lb 7.2 oz (75.5 kg)] 166 lb 7.2 oz (75.5 kg) (05/14 0700)   INTAKE / OUTPUT: Intake/Output     05/13 0701 - 05/14 0700 05/14 0701 - 05/15 0700   P.O.  240   I.V. (mL/kg) 1951 (25.8) 462 (6.1)   NG/GT 650 40   IV Piggyback 150    Total Intake(mL/kg) 2751 (36.4) 742 (9.8)   Urine (mL/kg/hr) 955 (0.5) 2000 (2.6)   Total Output 955 2000   Net +1796 -1258          PHYSICAL EXAMINATION: General: anxious but no diaphoretic Neuro: Awake,alert, moves all ext to command. HEENT: [place]/AT, PERRL, EOM-I and -LAN/thyromegally. Cardiovascular:  Tachy rrr. Lungs: improved air movement. Prolonged exp wheeze. Decreased on left,but improved.  Abdomen:  Soft, not tender, ND and +BS.  Musculoskeletal:  Intact. Skin:  Intact.  LABS:  Recent Labs Lab 11/20/12 1123  11/20/12 1212 11/20/12 1330 11/20/12 1723 11/20/12 2319 11/21/12 0346  11/22/12 0325 11/22/12 0445  HGB  --   --  9.5*  --   --   --  9.5* 9.4*  --   WBC  --   --  8.0  --   --   --  7.2 14.8*  --   PLT  --   --  258  --   --   --  259 292  --   NA  --   --  137  --   --   --  135 136  --   K  --   < > 3.7  --   --   --  4.2 3.9  --   CL  --   --  98  --   --   --  99 101  --   CO2  --   --  30  --   --   --  29 28  --   GLUCOSE  --   --  172*  --   --   --  162* 203*  --   BUN  --   --  8  --   --   --  14 16  --   CREATININE  --   --  0.48*  --   --   --  0.46* 0.45*  --   CALCIUM  --   --  9.1  --   --   --  9.1 9.1  --   MG  --   --  2.0  --   --   --   --  2.1  --   PHOS  --   --  4.3  --   --   --   --  2.7  --   AST  --   --  11  --   --   --   --   --   --   ALT  --   --  6  --   --   --   --   --   --   ALKPHOS  --   --  56  --   --   --   --   --   --   BILITOT  --   --  0.3  --   --   --   --   --   --   PROT  --   --  7.1  --   --   --   --   --   --   ALBUMIN  --   --  3.3*  --   --   --   --   --   --   APTT  --   --  37  --   --   --   --   --   --   INR  --   --  1.09  --   --   --   --   --   --   TROPONINI  --   --  <0.30  --  <0.30 <0.30  --   --   --   PHART 7.317*  --   --  7.366  --   --   --   --  7.415  PCO2ART 55.4*  --   --  51.1*  --   --   --   --  43.7  PO2ART 53.1*  --   --  447.0*  --   --   --   --  65.2*  < > = values in this interval not displayed.  Recent Labs Lab 11/21/12 2343 11/22/12 0426 11/22/12 0825 11/22/12 1216 11/22/12 1553  GLUCAP 199* 203* 191* 158* 175*   CXR: Clear   ASSESSMENT / PLAN:  PULMONARY A: acute on chronic resp failure in setting of AECOPD s/p EBUS.    Extubated  am 5/14 with resp distress/ hbp pm 5/14 P:    - Bipap/ mgs04 now - Steroids  Increased to 125 mg iv q6. - Levofloxacin IV. - Cultures pending.  CARDIOVASCULAR A: Hypertension P:    - decrease IVFs/add lasix x one/ clonidine pm 5/14  RENAL A:  Low UOP resolved P:     GASTROINTESTINAL A:  H/o GERD P:    Advance diet/ max gerd rx added 5/14  HEMATOLOGIC A:  Adenocarcinoma of lung P:  -   hme/onc consulted  INFECTIOUS A:  AECOPD  P:   - See dashboard.  ENDOCRINE A:  DM P:   - SSI while on steroids.  NEUROLOGIC A:  Acute agitation/anxiety (resolved) P:   judicicious MS IV to control RR     CC time 45 min  Sandrea Hughs, MD Pulmonary and Critical Care Medicine [facility] Cell [phone] After 5:30 PM or weekends, call [phone]

## 2012-11-22 NOTE — Progress Notes (Signed)
eLink Physician-Brief Progress Note Patient Name: Alan [name] DOB: Jul 20, 1931 MRN: [id number]  Date of Service  11/22/2012   HPI/Events of Note   Pt in acute resp distress.    eICU Interventions  Place on bipap, likely will need reintubation   Intervention Category Major Interventions: Respiratory failure - evaluation and management  Shan Levans 11/22/2012, 4:57 PM

## 2012-11-22 NOTE — Plan of Care (Signed)
Problem: Phase II Progression Outcomes Goal: Time pt extubated/weaned off vent Outcome: Completed/Met Date Met:  11/22/12 0845

## 2012-11-22 NOTE — Plan of Care (Signed)
Problem: Phase II Progression Outcomes Goal: Date pt extubated/weaned off vent Outcome: Completed/Met Date Met:  11/22/12 11/22/12

## 2012-11-22 NOTE — Progress Notes (Signed)
Dr. Delford Field called in elink  To inform him of pt's increased WOB and prolonged exp wheeze bilaterally. RT called to place pt on bipap. Alan [name]

## 2012-11-23 ENCOUNTER — Ambulatory Visit
Admit: 2012-11-23 | Discharge: 2012-11-23 | Disposition: A | Discharge status: Home / Self Care | Payer: Medicare Other | Attending: Radiation Oncology | Admitting: Radiation Oncology

## 2012-11-23 ENCOUNTER — Encounter (INDEPENDENT_AMBULATORY_CARE_PROVIDER_SITE_OTHER): Payer: Medicare Other | Admitting: General Surgery

## 2012-11-23 DIAGNOSIS — C3400 Malignant neoplasm of unspecified main bronchus: Secondary | ICD-10-CM

## 2012-11-23 DIAGNOSIS — R599 Enlarged lymph nodes, unspecified: Secondary | ICD-10-CM

## 2012-11-23 LAB — GLUCOSE, CAPILLARY
Glucose-Capillary: 210 mg/dL — ABNORMAL HIGH (ref 70–99)
Glucose-Capillary: 265 mg/dL — ABNORMAL HIGH (ref 70–99)

## 2012-11-23 MED ORDER — LABETALOL HCL 5 MG/ML IV SOLN
10.0000 mg | INTRAVENOUS | Status: DC | PRN
  Filled 2012-11-23: qty 4

## 2012-11-23 MED ORDER — FUROSEMIDE 10 MG/ML IJ SOLN
INTRAMUSCULAR | Status: AC
  Filled 2012-11-23: qty 4

## 2012-11-23 MED ORDER — MORPHINE SULFATE 2 MG/ML IJ SOLN: 1.0000 mg | INTRAMUSCULAR | Status: DC | PRN

## 2012-11-23 MED ORDER — CLONIDINE HCL 0.2 MG PO TABS
0.2000 mg | ORAL_TABLET | Freq: Three times a day (TID) | ORAL | Status: DC
  Administered 2012-11-23 – 2012-11-27 (×13): 0.2 mg via ORAL
  Filled 2012-11-23 (×15): qty 1

## 2012-11-23 MED ORDER — METHYLPREDNISOLONE SODIUM SUCC 125 MG IJ SOLR
80.0000 mg | Freq: Four times a day (QID) | INTRAMUSCULAR | Status: DC
  Administered 2012-11-23 – 2012-11-24 (×5): 80 mg via INTRAVENOUS
  Filled 2012-11-23 (×8): qty 1.28

## 2012-11-23 MED ORDER — GLUCERNA SHAKE PO LIQD
237.0000 mL | Freq: Two times a day (BID) | ORAL | Status: DC
  Administered 2012-11-23 – 2012-11-27 (×6): 237 mL via ORAL
  Filled 2012-11-23 (×9): qty 237

## 2012-11-23 MED ORDER — FUROSEMIDE 10 MG/ML IJ SOLN
20.0000 mg | Freq: Two times a day (BID) | INTRAMUSCULAR | Status: AC
  Administered 2012-11-23 (×2): 20 mg via INTRAVENOUS
  Filled 2012-11-23: qty 2

## 2012-11-23 NOTE — Progress Notes (Signed)
NUTRITION FOLLOW UP  Intervention:   Provide Glucerna Shakes BID Encourage PO intake  Nutrition Dx:   Inadequate oral intake related to inability to eat as evidenced by NPO status and pt on Vent; ongoing inadequate intake but, pt extubated and on regular diet now   Goal:   Pt to meet >/= 90% of their estimated nutrition needs; not met   Monitor:   TF; discontinued Vent status; extubated, now using Venturi mask Weight; 1 lb wt increase Labs; low hemoglobin BM; no BM recorded  Assessment:   Pt extubated on 5/14 and RN reports poor po intake. Pt reports that he is eating very little due to poor appetite and was also eating less than usual PTA; denies nausea and pain. Pt states that he usually weighs around 160 lbs.   Height: Ht Readings from Last 1 Encounters:  11/21/12 5\' 5"  (1.651 m)    Weight Status:   Wt Readings from Last 1 Encounters:  11/22/12 166 lb 7.2 oz (75.5 kg)    Re-estimated needs:  Kcal: 1610-9604 Protein: 113-128 grams Fluid: 2.3 L  Skin: intact; + Generalized edema, +2 RLE and LLE edema  Diet Order: General   Intake/Output Summary (Last 24 hours) at 11/23/12 1244 Last data filed at 11/23/12 1210  Gross per 24 hour  Intake    785 ml  Output   5985 ml  Net  -5200 ml    Last BM: PTA   Labs:   Recent Labs Lab 11/20/12 1212 11/21/12 0346 11/22/12 0325  NA 137 135 136  K 3.7 4.2 3.9  CL 98 99 101  CO2 30 29 28   BUN 8 14 16   CREATININE 0.48* 0.46* 0.45*  CALCIUM 9.1 9.1 9.1  MG 2.0  --  2.1  PHOS 4.3  --  2.7  GLUCOSE 172* 162* 203*    CBG (last 3)   Recent Labs  11/22/12 1950 11/23/12 0806 11/23/12 1143  GLUCAP 165* 210* 192*    Scheduled Meds: . albuterol  2.5 mg Nebulization QID  . antiseptic oral rinse  15 mL Mouth Rinse QID  . chlorhexidine  15 mL Mouth Rinse BID  . clobetasol ointment  1 application Topical BID  . cloNIDine  0.2 mg Oral TID  . furosemide      . furosemide  20 mg Intravenous BID  . insulin  aspart  0-15 Units Subcutaneous TID WC  . ipratropium  0.5 mg Nebulization QID  . levofloxacin (LEVAQUIN) IV  750 mg Intravenous Q24H  . methylPREDNISolone (SOLU-MEDROL) injection  80 mg Intravenous Q6H  . pantoprazole  40 mg Oral BID AC    Continuous Infusions: . sodium chloride 10 mL/hr at 11/22/12 1757    Ian Malkin RD, LDN Inpatient Clinical Dietitian Pager: [phone] After Hours Pager: [phone]

## 2012-11-23 NOTE — Consult Note (Signed)
[facility]  [phone]     ONCOLOGY  HOSPITAL CONSULTATION NOTE  ERAN MISTRY                                MR#: [id number]  DOB: Mar 30, 1932                       CSN#: [id number]  Referring MD: CCM/Pulmonary  Primary MD: Dr. Modesto Charon  Reason for Consult: Lung Cancer  Patient Identification: 79 year od male initially seen by Dr. Truett Perna on 11/10/2012 for a diagnosis of Stage I (T2 N0) well differentiated adenocarcinoma of the cecum, status post a right colectomy 10/12/2012; pathology ([id number]) is confirmed an invasive well-differentiated adenocarcinoma of the cecum. Tumor invaded into the muscularis propria without definite invasion into underlying soft tissues. Lymphovascular and perineural invasion were not identified. The resection margins were negative. No macroscopic tumor perforation. 17 lymph nodes were negative for metastatic disease.   In review,he was referred to Dr. Ewing Schlein after being diagnosed with anemia and a Hemoccult positive stool. He was taken to a colonoscopy procedure on 09/04/2012. Diverticula were found in the sigmoid colon. Multiple sigmoid, proximal descending, and transverse colon polyps were noted and biopsied. An ulcerated mass was found in the cecum. The pathology from the cecal mass revealed superficial fragments of high-grade glandular dysplasia. The remaining polyps were tubular adenomas with no high-grade dysplasia or malignancy. no indication for adjuvant therapy if he does not have metastatic colon cancer. He has an excellent prognosis for a long-term disease-free survival with regard to the stage I colon cancer.   Staging workup had included  CTs of the chest, abdomen, and pelvis on 09/26/2012. Abnormal mediastinal adenopathy was noted including a 2.4 Center precarinal node and a 3.2 cm subcarinal node. These were larger compared to a CT from 08/18/2012. CT of the abdomen confirmed an abdominal aortic aneurysm, several small  gastrohepatic lymph nodes, diffuse hepatic steatosis and a large cecal mass. Small cecal mesenteric nodes were present. No free fluid or peritoneal disease. He was referred to pulmonary medicine for a diagnostic bronchoscopy. He underwent  Flexible video fiberoptic bronchoscopy with endobronchial ultrasound and biopsies of Left mainstem mass x 3 and of  of  chest lymph nodes  At stations 7, 4 R, 4L, 2L, on 11/20/2012. LMSB biopsy was positive for Invasive Adenocarcinoma, lung primary, with stains positive for TTF1, Napsin A, and negative for CK20, CDX2, p63, CK5/6, S100. The tumor block was sent out for EGFR and ALK testing and an addendum will follow. Bronchial washings shoed malignant cells present.     HPI: During bronchoscopy, he was noted to have significant obstruction of LMSB. Post operatively  had marked bronchospasm, cough and acute resp failure, requiring admission to  ICU. He was placed on Steroids, Levofloxacin after cultures, and intubation until 5/14. Currently extubated, on O2 mask.   We have been requested to see Mr. Foots with recommendations regarding diagnosis of Invasive Adenocarcinoma of Lung Primary.  PMH:  Past Medical History  Diagnosis Date  . DM (diabetes mellitus) type 2 09/18/2012  . AAA (abdominal aortic aneurysm)   . COPD (chronic obstructive pulmonary disease)   . Bronchitis   . Hypertension     borderline   . Shortness of breath     with exertion   . Sleep apnea     sleeps with oxygen at 2.5 L/[place]   .  Acute sinusitis     10/03/12   . History of kidney stones     many yrs ago  . Cancer  10/12/2012     colon cancer (T2 N0) cecum   . Anemia requiring transfusions 11-16-12    last 10-18-12 s/p Partial colectomy    Surgeries:  Past Surgical History  Procedure Laterality Date  . Scrotum exploration    . Removal of abcess  2006    pt. states abcess was located behind his tonsils  . Laparoscopic partial colectomy N/A 10/12/2012    Procedure: LAPAROSCOPIC  PARTIAL COLECTOMY;  Surgeon: Adolph Pollack, MD;  Location: [facility];  Service: General;  Laterality: N/A;       . Abscess removed from throat    . Cataract extraction, bilateral    . Endobronchial ultrasound Bilateral 11/20/2012    Procedure: ENDOBRONCHIAL ULTRASOUND;  Surgeon: Leslye Peer, MD;  Location: [facility];  Service: Cardiopulmonary;  Laterality: Bilateral;    Allergies:  Allergies  Allergen Reactions  . Sulfa Antibiotics Anaphylaxis  . Xyzal (Levocetirizine) Shortness Of Breath  . Pseudoephedrine     stops pt from urinating  . Amoxicillin Rash    Medications:   . albuterol  2.5 mg Nebulization QID  . antiseptic oral rinse  15 mL Mouth Rinse QID  . chlorhexidine  15 mL Mouth Rinse BID  . clobetasol ointment  1 application Topical BID  . cloNIDine  0.1 mg Oral TID  . insulin aspart  0-15 Units Subcutaneous TID WC  . ipratropium  0.5 mg Nebulization QID  . levofloxacin (LEVAQUIN) IV  750 mg Intravenous Q24H  . methylPREDNISolone (SOLU-MEDROL) injection  125 mg Intravenous Q6H  . pantoprazole  40 mg Oral BID AC     WUJ:WJXBJY chloride, albuterol, hydrALAZINE, labetalol, promethazine  ROS: Constitutional: Positive for weight loss. Negative for fever, chills or  night sweats.  Eyes: Negative for blurred vision and double vision.  Respiratory: less SOB.   Cardiovascular: Negative for chest pain. No palpitations.  GI: Negative for  nausea, vomiting, diarrhea or constipation. No change in bowel caliber. No  Melena or hematochezia. Positive  abdominal pai secondary to tumor.  GU: Negative for hematuria. No loss of urinary control. No urinary retention. Skin: Negative for itching. No rash. No petechia. No easy  Bruising. Musculoskeletal: Denies back , arm pain.  Neurological: No headaches.No confusion. No motor or sensory deficits.some agitation due to shortness of breath on prior days.  Family History:    Family History  Problem Relation Age of Onset  . Heart  disease Father   . Heart disease Mother       Social History:  reports that he quit smoking about 11 years ago. His smoking use included Cigarettes. He has a 90 pack-year smoking history. He quit smokeless tobacco use about 11 years ago. His smokeless tobacco use included Chew. He reports that he does not drink alcohol or use illicit drugs.  Married  1 son, 1 daughter, no family history of cancer. Lives in [place], [place]  Physical Exam    Filed Vitals:   11/23/12 0800  BP: 187/83  Pulse: 94  Temp:   Resp: 25     Filed Weights   11/21/12 0400 11/22/12 0700  Weight: 165 lb 2 oz (74.9 kg) 166 lb 7.2 oz (75.5 kg)   General:  79 -year-old  in no acute distress A. and O. x3  well-developed, well-nourished.  HEENT: Normocephalic, atraumatic, PERRLA. Sclerae anicteric. Oral cavity without thrush  or lesions NECK:supple. no thyromegaly  LUNGS: Bilateral expiratory  wheezing,  Mild rhonchi , no  rales. Decreased breath sounds at bases.No axillary masses. CARDIOVASCULAR: regular rate and rhythm, no murmur , rubs or gallops ABDOMEN: soft nontender , bowel sounds x4. No HSM. No masses palpable.  GU/rectal: deferred. EXTREMITIES: no clubbing cyanosis or edema.   NEURO: Alert and oriented  Lymph nodes: No cervical, supraclavicular, axillary, or inguinal nodes   Labs:  CBC   Recent Labs Lab 11/20/12 1212 11/21/12 0346 11/22/12 0325  WBC 8.0 7.2 14.8*  HGB 9.5* 9.5* 9.4*  HCT 30.2* 30.3* 29.9*  PLT 258 259 292  MCV 87.0 86.6 86.7  MCH 27.4 27.1 27.2  MCHC 31.5 31.4 31.4  RDW 15.3 15.5 15.6*     CMP    Recent Labs Lab 11/20/12 1212 11/21/12 0346 11/22/12 0325  NA 137 135 136  K 3.7 4.2 3.9  CL 98 99 101  CO2 30 29 28   GLUCOSE 172* 162* 203*  BUN 8 14 16   CREATININE 0.48* 0.46* 0.45*  CALCIUM 9.1 9.1 9.1  MG 2.0  --  2.1  AST 11  --   --   ALT 6  --   --   ALKPHOS 56  --   --   BILITOT 0.3  --   --         Component Value Date/Time   BILITOT 0.3 11/20/2012 1212     Imaging Studies:   Dg Chest 2 View  10/26/2012   *RADIOLOGY REPORT*  Clinical Data: Cough.  CHEST - 2 VIEW  Comparison: None.  Findings: Heart size is normal.  Both lungs are clear. Mild pulmonary hyperinflation is suspicious for COPD.  No evidence of pleural effusion.  No mass or lymphadenopathy identified.  Old left rib fracture deformities are noted as well as several lower thoracic vertebral body compression deformities.  IMPRESSION:  1.  No active disease. 2.  Probable COPD. 3.  Old left rib fracture and lower thoracic vertebral compression deformities.   Original Report Authenticated By: Myles Rosenthal, M.D.   Dg Chest Port 1 View  11/22/2012   *RADIOLOGY REPORT*  Clinical Data: Endotracheal tube placement  PORTABLE CHEST - 1 VIEW  Comparison: 11/21/2012  Findings: 0503 hours.  Endotracheal tube tip is 4.1 cm above the base of the carina.  The patient is rotated to the right. Interstitial markings are diffusely coarsened with chronic features. The NG tube passes into the stomach although the distal tip position is not included on the film.  No airspace pulmonary edema or focal lung consolidation. Telemetry leads overlie the chest.  IMPRESSION: Rotated film.  Underlying chronic interstitial changes and associated degree of mild interstitial edema not excluded.   Original Report Authenticated By: Kennith Center, M.D.    11/20/2012   *RADIOLOGY REPORT*  Clinical Data: Status post endoscopy.  PORTABLE CHEST - 1 VIEW  Comparison: 11/12/2012  Findings: 1135 hours.  No evidence for pneumothorax. The cardiopericardial silhouette is within normal limits for size. Mild increase in vascular congestion noted without overt airspace pulmonary edema. Telemetry leads overlie the chest.  Multiple left- sided rib fractures are evident.  IMPRESSION: Stable.  Mild increase in vascular congestion.  Otherwise no acute cardiopulmonary findings.   Original Report Authenticated By: Kennith Center, M.D.    Diagnosis Endobronchial biopsy, left main stem - INVASIVE ADENOCARCINOMA OF LUNG PRIMARY. Microscopic Comment There are malignant glandular cells arranged in cords and acini. Immunostains were performed and the tumor cells show  the following immunoprofile: CK7: positive TTF-1: positive Napsin A: positive CK20: negative CDX-2: virtually negative p63: negative CK5/6: negative S-100: negative Controls are appropriate. The overall findings are diagnostic for invasive adenocarcinoma of lung primary. The tumor block will be sent out for EGFR and ALK testing and an addendum will follow. Case was discussed with Dr. Delton Coombes on 11-22-2012. Abigail Miyamoto MD Pathologist, Electronic Signature (Case signed 11/22/2012)   A/P: 79 y.o. male admitted with a  Recent history of  Stage I Colon Cancer s/p R hemicolectomy, now with a diagnosis of Invasive Adenocarcinoma lung primary, asked to see in consultation as the patient was admitted on 11/20/12 after developing acute respiratory falilure post bronchoscopy.   An addendum to this note is to be written by Dr. Truett Perna with recommendations regarding his care from the oncological standpoint. .  Thank you for the referral.  Marcos Eke, PA-C 11/23/2012 9:14 AM  Mr. Cotta was interviewed and examined. I discussed the diagnosis of non-small cell lung cancer and treatment options with Mr. Hayduk and his family. He appears to have at least stage IIIB non-small cell lung cancer. They understand this cancer is related to the recent diagnosis of colon cancer. The chance of undergoing curative therapy is small. There is no indication for surgery. I recommend concurrent weekly Taxol/carboplatin chemotherapy and radiation if his respiratory status continues to improve.  Impression: 1. Non-small cell lung cancer-obstructing left endobronchial lesion with chest lymphadenopathy, status post a bronchoscopy/EBUS 11/20/2012  2. Respiratory failure following the  bronchoscopy procedure 11/20/2012, now maintained on supplemental oxygen and bronchodilator therapy  3. Stage I colon cancer April 2014  4. Abdominal aortic aneurysm-scheduled for percutaneous graft 12/01/2012  5. Diabetes  6. Anemia  7. Markedly elevated CEA-most likely secondary to the non-small cell lung cancer  Recommendations: 1. Continue management of respiratory failure per pulmonary medicine 2. Consult radiation oncology 3. Plan for concurrent chemotherapy and radiation to treat the locally advanced non-small cell lung cancer 4. Consult vascular surgery to discuss the indication for proceeding with the aneurysm repair

## 2012-11-23 NOTE — Progress Notes (Signed)
PULMONARY  / CRITICAL CARE MEDICINE  Name: Alan [name] MRN: [id number] DOB: 04/14/1932    ADMISSION DATE:  11/20/2012 CONSULTATION DATE:  5/12  PRIMARY SERVICE: PCCM   CHIEF COMPLAINT:  Acute resp failure   BRIEF PATIENT DESCRIPTION:  79 year old male w/ h/o COPD. Presented for scheduled EBUS on 5/12. Pre-op did have some increased SOB c/w prior visit. Underwent procedure. Of notehad sig obstruction of LMSB. Post op had marked bronchospasm, cough and acute resp failure. Failed NIPPV. Admitted to ICU for acute resp failure.   SIGNIFICANT EVENTS / STUDIES:  Cytology: 5/12: C/w adeno ca  LINES / TUBES: OETT 5/12>>>5/14  CULTURES: Sputum 5/12>>>  ANTIBIOTICS: levaquin 5/12>>>  SUBJECTIVE/Overnight:  Extubated - some resp distress post extubation improved with bipap. Still c/o some mild SOB, feels like he is "gasping" at times.   VITAL SIGNS: Temp:  [96.4 F (35.8 C)-98.1 F (36.7 C)] 98.1 F (36.7 C) (05/15 0000) Pulse Rate:  [93-119] 94 (05/15 0800) Resp:  [15-34] 25 (05/15 0800) BP: (113-198)/(61-129) 187/83 mmHg (05/15 0800) SpO2:  [85 %-99 %] 97 % (05/15 0755) FiO2 (%):  [40 %-50 %] 40 % (05/15 0755) HEMODYNAMICS:   VENTILATOR SETTINGS: Vent Mode:  [-] Other (Comment) FiO2 (%):  [40 %-50 %] 40 % Set Rate:  [10 bmp] 10 bmp PEEP:  [6 cmH20] 6 cmH20 INTAKE / OUTPUT: Intake/Output     05/14 0701 - 05/15 0700 05/15 0701 - 05/16 0700   P.O. 240    I.V. (mL/kg) 652 (8.6)    NG/GT 40    IV Piggyback 50    Total Intake(mL/kg) 982 (13)    Urine (mL/kg/hr) 5445 (3)    Total Output 5445     Net -4463            PHYSICAL EXAMINATION: General: Calm, comfortable, no distress  Neuro: Awake,alert, moves all ext to command. HEENT: [place]/AT, PERRL, EOM-I and -LAN/thyromegally. Cardiovascular:  Tachy rrr. Lungs: Resps even non labored on VM, significant wheeze L, few scattered ronchi  Abdomen:  Soft, not tender, ND and +BS.  Musculoskeletal:  Intact. Skin:   Intact.  LABS:  Recent Labs Lab 11/20/12 1123  11/20/12 1212 11/20/12 1330 11/20/12 1723 11/20/12 2319 11/21/12 0346 11/22/12 0325 11/22/12 0445  HGB  --   --  9.5*  --   --   --  9.5* 9.4*  --   WBC  --   --  8.0  --   --   --  7.2 14.8*  --   PLT  --   --  258  --   --   --  259 292  --   NA  --   --  137  --   --   --  135 136  --   K  --   < > 3.7  --   --   --  4.2 3.9  --   CL  --   --  98  --   --   --  99 101  --   CO2  --   --  30  --   --   --  29 28  --   GLUCOSE  --   --  172*  --   --   --  162* 203*  --   BUN  --   --  8  --   --   --  14 16  --   CREATININE  --   --  0.48*  --   --   --  0.46* 0.45*  --   CALCIUM  --   --  9.1  --   --   --  9.1 9.1  --   MG  --   --  2.0  --   --   --   --  2.1  --   PHOS  --   --  4.3  --   --   --   --  2.7  --   AST  --   --  11  --   --   --   --   --   --   ALT  --   --  6  --   --   --   --   --   --   ALKPHOS  --   --  56  --   --   --   --   --   --   BILITOT  --   --  0.3  --   --   --   --   --   --   PROT  --   --  7.1  --   --   --   --   --   --   ALBUMIN  --   --  3.3*  --   --   --   --   --   --   APTT  --   --  37  --   --   --   --   --   --   INR  --   --  1.09  --   --   --   --   --   --   TROPONINI  --   --  <0.30  --  <0.30 <0.30  --   --   --   PHART 7.317*  --   --  7.366  --   --   --   --  7.415  PCO2ART 55.4*  --   --  51.1*  --   --   --   --  43.7  PO2ART 53.1*  --   --  447.0*  --   --   --   --  65.2*  < > = values in this interval not displayed.  Recent Labs Lab 11/22/12 0825 11/22/12 1216 11/22/12 1553 11/22/12 1950 11/23/12 0806  GLUCAP 191* 158* 175* 165* 210*   CXR:  Dg Chest Port 1 View  11/22/2012   *RADIOLOGY REPORT*  Clinical Data: Endotracheal tube placement  PORTABLE CHEST - 1 VIEW  Comparison: 11/21/2012  Findings: 0503 hours.  Endotracheal tube tip is 4.1 cm above the base of the carina.  The patient is rotated to the right. Interstitial markings are diffusely coarsened  with chronic features. The NG tube passes into the stomach although the distal tip position is not included on the film.  No airspace pulmonary edema or focal lung consolidation. Telemetry leads overlie the chest.  IMPRESSION: Rotated film.  Underlying chronic interstitial changes and associated degree of mild interstitial edema not excluded.   Original Report Authenticated By: Kennith Center, M.D.     ASSESSMENT / PLAN:  PULMONARY A: acute on chronic resp failure in setting of AECOPD s/p EBUS.  Resp distress post extubation 5/14 - improved with bipap but remains marginal P:   -wean FIO2 - Scheduled BDs - Steroids as ordered, will begin taper 5/15 -  Levofloxacin IV. - Cultures pending. - consider qhs bipap   CARDIOVASCULAR A: HTN P:  - Tele. - decrease IVFs - increase clonidine 5/15  RENAL A:  Low UOP. P:   - IVFs. - Trend BMP. - Replace electrolytes as needed.  GASTROINTESTINAL A:  H/o GERD P:   Advance diet   HEMATOLOGIC A:  Adenocarcinoma of lung P:  - Dr Truett Perna following  - Rad/Onc to see 5/15  INFECTIOUS A:  AECOPD  P:   - See dashboard.  ENDOCRINE A:  DM P:   - SSI while on steroids.  NEUROLOGIC A:  Acute agitation/anxiety (resolved) P:   PRN pain rx   TODAY'S SUMMARY: Path positive for adeno. Heme/onc following, will involve rad/onc.  Will d/w Dr. Cory Roughen who had AAA repair planned for next week.   Pt/family updated extensively at bedside.  Began goals of care discussion but dont think they are ready.  Consider palliative care assistance over next few days.     Danford Bad, NP 11/23/2012  9:17 AM Pager: [phone] or [phone]  *Care during the described time interval was provided by me and/or other providers on the critical care team. I have reviewed this patient's available data, including medical history, events of note, physical examination and test results as part of my evaluation.  Spoke with family and patient extensively,  after discussion, decided to make patient no CPR/cardioversion/trach/peg/SNF.  Will hold in the ICU overnight due to respiratory failure with wheezing, BiPAP to avoid intubation.    CC time 35 min.  Patient seen and examined, agree with above note.  I dictated the care and orders written for this patient under my direction.  Alyson Reedy, MD [phone]

## 2012-11-23 NOTE — Consult Note (Signed)
Radiation Oncology         [phone]) [phone] ________________________________  Name: Alan [name] MRN: [id number]  Date: 11/16/2012  DOB: 1932/05/06  WU:JWJX,BJYNWGN PATRICK, MD  G. Rolm Baptise M.D.  REFERRING PHYSICIAN: Quenton Fetter M.D.  DIAGNOSIS: Non-small cell lung cancer  HISTORY OF PRESENT ILLNESS::Alan [name] is a 79 y.o. male who is seen for an initial consultation visit. The patient has a recent history of a new diagnosis of adenocarcinoma of the cecum. He is status post right colectomy on 10/13/2002 which returned positive for a T3, N0, M0 tumor.  The patient's workup included a CT scan of the chest. This showed mediastinal lymphadenopathy of unclear etiology. This appeared new from prior imaging. There is no findings of metastatic disease within the abdomen or pelvis. A CT angiogram was completed on 09/26/2012. Abnormal mediastinal and right hilar adenopathy was worse compatible with some progression of disease.  The patient proceeded with the fiberoptic bronchoscopy with endobronchial ultrasound and biopsies earlier this week on 11/20/2012. A friable endobronchial lesion was seen protruding from the medial wall of the distal left mainstem bronchus. Biopsies have returned positive for adenocarcinoma consistent with a lung primary.  The patient suffered from acute respiratory distress after this procedure. He was admitted and was intubated. The patient now has been extubated and I have been asked to see him for consideration of radiotherapy at this time. The patient indicates that he is breathing better today and he was yesterday. He still has some shortness of breath. He has chronic shortness of breath associated with COPD a when he does feel that his breathing has been worse over the last several months then his prior baseline. He denies any pain in the chest area or elsewhere.   PREVIOUS RADIATION THERAPY: No   PAST MEDICAL HISTORY:  has a past medical history  of DM (diabetes mellitus) type 2 (09/18/2012); AAA (abdominal aortic aneurysm); COPD (chronic obstructive pulmonary disease); Bronchitis; Hypertension; Shortness of breath; Sleep apnea; Acute sinusitis; History of kidney stones; Cancer; and Anemia requiring transfusions (11-16-12).     PAST SURGICAL HISTORY: Past Surgical History  Procedure Laterality Date  . Scrotum exploration    . Removal of abcess  2006    pt. states abcess was located behind his tonsils  . Laparoscopic partial colectomy N/A 10/12/2012    Procedure: LAPAROSCOPIC PARTIAL COLECTOMY;  Surgeon: Adolph Pollack, MD;  Location: [facility];  Service: General;  Laterality: N/A;  . Colon surgery    . Abscess removed from throat    . Cataract extraction, bilateral    . Endobronchial ultrasound Bilateral 11/20/2012    Procedure: ENDOBRONCHIAL ULTRASOUND;  Surgeon: Leslye Peer, MD;  Location: [facility];  Service: Cardiopulmonary;  Laterality: Bilateral;     FAMILY HISTORY: family history includes Heart disease in his father and mother.   SOCIAL HISTORY:  reports that he quit smoking about 11 years ago. His smoking use included Cigarettes. He has a 90 pack-year smoking history. He quit smokeless tobacco use about 11 years ago. His smokeless tobacco use included Chew. He reports that he does not drink alcohol or use illicit drugs.   ALLERGIES: Sulfa antibiotics; Xyzal; Pseudoephedrine; and Amoxicillin   MEDICATIONS:  Current Facility-Administered Medications  Medication Dose Route Frequency Provider Last Rate Last Dose  . 0.9 %  sodium chloride infusion  250 mL Intravenous PRN Simonne Martinet, NP      . 0.9 %  sodium chloride infusion   Intravenous Continuous Charlaine Dalton  Wert, MD 10 mL/hr at 11/22/12 1757    . albuterol (PROVENTIL) (5 MG/ML) 0.5% nebulizer solution 2.5 mg  2.5 mg Nebulization QID Alyson Reedy, MD   2.5 mg at 11/23/12 0916  . albuterol (PROVENTIL) (5 MG/ML) 0.5% nebulizer solution 2.5 mg  2.5 mg Nebulization Q2H  PRN Alyson Reedy, MD   2.5 mg at 11/23/12 1130  . antiseptic oral rinse (BIOTENE) solution 15 mL  15 mL Mouth Rinse QID Leslye Peer, MD   15 mL at 11/23/12 1200  . chlorhexidine (PERIDEX) 0.12 % solution 15 mL  15 mL Mouth Rinse BID Leslye Peer, MD   15 mL at 11/23/12 0836  . clobetasol ointment (TEMOVATE) 0.05 % 1 application  1 application Topical BID Simonne Martinet, NP   1 application at 11/23/12 1200  . cloNIDine (CATAPRES) tablet 0.2 mg  0.2 mg Oral TID Bernadene Person, NP   0.2 mg at 11/23/12 1059  . feeding supplement (GLUCERNA SHAKE) liquid 237 mL  237 mL Oral BID BM Reanne Vista Lawman, RD      . furosemide (LASIX) injection 20 mg  20 mg Intravenous BID Bernadene Person, NP   20 mg at 11/23/12 1241  . hydrALAZINE (APRESOLINE) injection 10 mg  10 mg Intravenous Q4H PRN Simonne Martinet, NP   10 mg at 11/23/12 0836  . insulin aspart (novoLOG) injection 0-15 Units  0-15 Units Subcutaneous TID WC Simonne Martinet, NP   3 Units at 11/23/12 1242  . ipratropium (ATROVENT) nebulizer solution 0.5 mg  0.5 mg Nebulization QID Alyson Reedy, MD   0.5 mg at 11/23/12 1129  . labetalol (NORMODYNE,TRANDATE) injection 10 mg  10 mg Intravenous Q2H PRN Zigmund Gottron, MD      . levofloxacin (LEVAQUIN) IVPB 750 mg  750 mg Intravenous Q24H Simonne Martinet, NP   750 mg at 11/23/12 1210  . methylPREDNISolone sodium succinate (SOLU-MEDROL) 125 mg/2 mL injection 80 mg  80 mg Intravenous Q6H Bernadene Person, NP   80 mg at 11/23/12 1210  . morphine 2 MG/ML injection 1-2 mg  1-2 mg Intravenous Q2H PRN Bernadene Person, NP      . pantoprazole (PROTONIX) EC tablet 40 mg  40 mg Oral BID AC Nyoka Cowden, MD   40 mg at 11/23/12 0835  . promethazine (PHENERGAN) injection 6.25-12.5 mg  6.25-12.5 mg Intravenous Q15 min PRN Phillips Grout, MD         REVIEW OF SYSTEMS:  A 15 point review of systems is documented in the electronic medical record. This was obtained by the nursing staff. However,  I reviewed this with the patient to discuss relevant findings and make appropriate changes.  Pertinent items are noted in HPI.    PHYSICAL EXAM:  height is 5\' 5"  (1.651 m) and weight is 166 lb 7.2 oz (75.5 kg). His axillary temperature is 97.1 F (36.2 C). His blood pressure is 163/68 and his pulse is 113. His respiration is 24 and oxygen saturation is 97%.   General: Well-developed, in no acute distress HEENT: Normocephalic, atraumatic Cardiovascular: Regular rate and rhythm Respiratory: Expiratory wheezes present with some scattered rhonchi GI: Soft, nontender, normal bowel sounds Extremities: No edema present    LABORATORY DATA:  Lab Results  Component Value Date   WBC 14.8* 11/22/2012   HGB 9.4* 11/22/2012   HCT 29.9* 11/22/2012   MCV 86.7 11/22/2012   PLT 292 11/22/2012   Lab Results  Component  Value Date   NA 136 11/22/2012   K 3.9 11/22/2012   CL 101 11/22/2012   CO2 28 11/22/2012   Lab Results  Component Value Date   ALT 6 11/20/2012   AST 11 11/20/2012   ALKPHOS 56 11/20/2012   BILITOT 0.3 11/20/2012      RADIOGRAPHY: Dg Sinus 1-2 Views  11/07/2012   *RADIOLOGY REPORT*  Clinical Data: Follow-up.  Sinusitis.  PARANASAL SINUSES - 1-2 VIEW  Comparison: 10/25/2012.  Findings: Previously seen right maxillary sinus opacification has improved.  Left maxillary sinus opacification has also improved. Fluid levels within both sinuses are no longer present.  Frontal sinuses appear clear.  Mastoid air cells appear clear.  IMPRESSION: Improved bilateral maxillary sinusitis.   Original Report Authenticated By: Andreas Newport, M.D.   Dg Sinus 1-2 Views  10/26/2012   *RADIOLOGY REPORT*  Clinical Data: Congestion.  PARANASAL SINUSES - 1-2 VIEW  Comparison: None  Findings: Air-fluid levels noted in the maxillary sinuses bilaterally, larger on the right.  I suspect there is underlying mucosal thickening as well.  Frontal sinuses are clear.  No acute bony abnormality.  IMPRESSION: Acute sinusitis  changes in the maxillary sinuses, likely superimposed on chronic sinusitis.   Original Report Authenticated By: Charlett Nose, M.D.   Dg Sinus 1-2 Views  10/25/2012   This is the back office imaging final text. See progress note  for physician's narrative and interpretation.   Dg Chest 2 View  10/26/2012   *RADIOLOGY REPORT*  Clinical Data: Cough.  CHEST - 2 VIEW  Comparison: None.  Findings: Heart size is normal.  Both lungs are clear. Mild pulmonary hyperinflation is suspicious for COPD.  No evidence of pleural effusion.  No mass or lymphadenopathy identified.  Old left rib fracture deformities are noted as well as several lower thoracic vertebral body compression deformities.  IMPRESSION:  1.  No active disease. 2.  Probable COPD. 3.  Old left rib fracture and lower thoracic vertebral compression deformities.   Original Report Authenticated By: Myles Rosenthal, M.D.   Dg Chest Port 1 View  11/22/2012   *RADIOLOGY REPORT*  Clinical Data: Endotracheal tube placement  PORTABLE CHEST - 1 VIEW  Comparison: 11/21/2012  Findings: 0503 hours.  Endotracheal tube tip is 4.1 cm above the base of the carina.  The patient is rotated to the right. Interstitial markings are diffusely coarsened with chronic features. The NG tube passes into the stomach although the distal tip position is not included on the film.  No airspace pulmonary edema or focal lung consolidation. Telemetry leads overlie the chest.  IMPRESSION: Rotated film.  Underlying chronic interstitial changes and associated degree of mild interstitial edema not excluded.   Original Report Authenticated By: Kennith Center, M.D.   Dg Chest Port 1 View  11/21/2012   *RADIOLOGY REPORT*  Clinical Data: Respiratory failure  PORTABLE CHEST - 1 VIEW  Comparison: 11/20/2012  Findings: Endotracheal tube 2.7 cm above the carina.  NG tube enters the stomach with the tip not visualized.  Monitor leads overlie the chest.  Skin folds overlie the upper lobes.  Slight improvement  in lung volumes with resolving mild interstitial edema pattern.  No effusion or pneumothorax.  No new collapse or consolidation.  Stable chronic bronchitic and interstitial pattern.  IMPRESSION: Improving mild interstitial edema pattern.  Stable support apparatus.   Original Report Authenticated By: Judie Petit. Miles Costain, M.D.   Portable Chest Xray  11/20/2012   *RADIOLOGY REPORT*  Clinical Data: 79 year old male intubated.  Acute-on-chronic exacerbation  of obstructive pulmonary disease.  Acute-on-chronic respiratory failure.  PORTABLE CHEST - 1 VIEW  Comparison: 1135 hours the same day and earlier.  Findings: Portable semi upright AP view at 1305 hours. Endotracheal tube projects over the midline, tip about halfway between clavicles and carina.  Mildly lower lung volumes.  Stable cardiac size and mediastinal contours.  No pneumothorax or pleural effusion.  Otherwise stable ventilation. External artifact projects over the right shoulder.  IMPRESSION: 1.  Intubated with endotracheal tube tip in good position. 2.  Mildly lower lung volumes.   Original Report Authenticated By: Erskine Speed, M.D.   Dg Chest Port 1 View  11/20/2012   *RADIOLOGY REPORT*  Clinical Data: Status post endoscopy.  PORTABLE CHEST - 1 VIEW  Comparison: 11/12/2012  Findings: 1135 hours.  No evidence for pneumothorax. The cardiopericardial silhouette is within normal limits for size. Mild increase in vascular congestion noted without overt airspace pulmonary edema. Telemetry leads overlie the chest.  Multiple left- sided rib fractures are evident.  IMPRESSION: Stable.  Mild increase in vascular congestion.  Otherwise no acute cardiopulmonary findings.   Original Report Authenticated By: Kennith Center, M.D.       IMPRESSION: The patient has a new diagnosis of non-small cell lung cancer. He suffered from acute respiratory distress and does continue to have some ongoing shortness of breath. I have seen him as an inpatient and he is an appropriate  candidate I believe to proceed with thoracic radiotherapy. I would initially target the mediastinal disease, especially the area in question regarding the left mainstem bronchus.  Further staging has been discussed with the patient. It is my understanding that the patient will proceed with a PET scan on an outpatient basis. I believe that this will be helpful to better appreciate the extent of the patient's disease. He has had some small subcentimeter bilateral pulmonary nodules. These may be a low PET detection with possible evidence of other metastatic disease would be helpful. In the absence of such information, I would plan to treat the patient with a definitive course of radiotherapy for approximately 6-1/2 to 7 weeks. Concurrent chemotherapy could be given if we are proceeding with a definitive course of treatment.   PLAN: I discussed proceeding with a simulation as soon as this can be arranged, likely tomorrow morning. We should be able to proceed with his first fraction of radiotherapy tomorrow afternoon.      ________________________________   Radene Gunning, MD, PhD

## 2012-11-24 ENCOUNTER — Ambulatory Visit
Admit: 2012-11-24 | Discharge: 2012-11-24 | Disposition: A | Discharge status: Home / Self Care | Payer: Medicare Other | Attending: Radiation Oncology | Admitting: Radiation Oncology

## 2012-11-24 ENCOUNTER — Inpatient Hospital Stay (HOSPITAL_COMMUNITY): Payer: Medicare Other

## 2012-11-24 ENCOUNTER — Encounter (HOSPITAL_COMMUNITY): Payer: Medicare Other

## 2012-11-24 DIAGNOSIS — Z79899 Other long term (current) drug therapy: Secondary | ICD-10-CM | POA: Insufficient documentation

## 2012-11-24 DIAGNOSIS — C3402 Malignant neoplasm of left main bronchus: Secondary | ICD-10-CM

## 2012-11-24 DIAGNOSIS — C3490 Malignant neoplasm of unspecified part of unspecified bronchus or lung: Secondary | ICD-10-CM | POA: Insufficient documentation

## 2012-11-24 DIAGNOSIS — Z510 Encounter for antineoplastic radiation therapy: Secondary | ICD-10-CM | POA: Insufficient documentation

## 2012-11-24 LAB — CBC
Hemoglobin: 10 g/dL — ABNORMAL LOW (ref 13.0–17.0)
MCV: 85.1 fL (ref 78.0–100.0)
Platelets: 257 10*3/uL (ref 150–400)
RDW: 16.1 % — ABNORMAL HIGH (ref 11.5–15.5)

## 2012-11-24 LAB — GLUCOSE, CAPILLARY: Glucose-Capillary: 184 mg/dL — ABNORMAL HIGH (ref 70–99)

## 2012-11-24 LAB — BASIC METABOLIC PANEL
BUN: 21 mg/dL (ref 6–23)
CO2: 33 mEq/L — ABNORMAL HIGH (ref 19–32)
Chloride: 93 mEq/L — ABNORMAL LOW (ref 96–112)
GFR calc Af Amer: >90 mL/min (ref >90)
Potassium: 3 mEq/L — ABNORMAL LOW (ref 3.5–5.1)

## 2012-11-24 LAB — MAGNESIUM: Magnesium: 2.4 mg/dL (ref 1.5–2.5)

## 2012-11-24 MED ORDER — TIOTROPIUM BROMIDE MONOHYDRATE 18 MCG IN CAPS
18.0000 ug | ORAL_CAPSULE | Freq: Every day | RESPIRATORY_TRACT | Status: DC
  Administered 2012-11-25 – 2012-11-27 (×3): 18 ug via RESPIRATORY_TRACT
  Filled 2012-11-24: qty 5

## 2012-11-24 MED ORDER — POTASSIUM CHLORIDE 10 MEQ/100ML IV SOLN
10.0000 meq | INTRAVENOUS | Status: AC
  Administered 2012-11-24 (×3): 10 meq via INTRAVENOUS
  Filled 2012-11-24: qty 400

## 2012-11-24 MED ORDER — INSULIN ASPART 100 UNIT/ML ~~LOC~~ SOLN
0.0000 [IU] | Freq: Three times a day (TID) | SUBCUTANEOUS | Status: DC
  Administered 2012-11-24: 5 [IU] via SUBCUTANEOUS
  Administered 2012-11-25: 3 [IU] via SUBCUTANEOUS
  Administered 2012-11-26: 8 [IU] via SUBCUTANEOUS
  Administered 2012-11-26 (×2): 3 [IU] via SUBCUTANEOUS
  Administered 2012-11-27 (×2): 2 [IU] via SUBCUTANEOUS

## 2012-11-24 MED ORDER — POTASSIUM CHLORIDE CRYS ER 20 MEQ PO TBCR
40.0000 meq | EXTENDED_RELEASE_TABLET | Freq: Once | ORAL | Status: AC
  Administered 2012-11-24: 40 meq via ORAL
  Filled 2012-11-24: qty 2

## 2012-11-24 MED ORDER — METHYLPREDNISOLONE SODIUM SUCC 125 MG IJ SOLR
60.0000 mg | Freq: Two times a day (BID) | INTRAMUSCULAR | Status: DC
  Administered 2012-11-26: 23:00:00 via INTRAVENOUS
  Administered 2012-11-26 – 2012-11-27 (×3): 60 mg via INTRAVENOUS
  Filled 2012-11-24 (×5): qty 0.96

## 2012-11-24 MED ORDER — FLUTICASONE PROPIONATE 50 MCG/ACT NA SUSP
2.0000 | Freq: Every day | NASAL | Status: DC
  Administered 2012-11-24 – 2012-11-27 (×4): 2 via NASAL
  Filled 2012-11-24: qty 16

## 2012-11-24 MED ORDER — POTASSIUM CHLORIDE 10 MEQ/100ML IV SOLN: 10.0000 meq | INTRAVENOUS | Status: DC

## 2012-11-24 MED ORDER — ENOXAPARIN SODIUM 40 MG/0.4ML ~~LOC~~ SOLN
40.0000 mg | Freq: Every day | SUBCUTANEOUS | Status: DC
  Administered 2012-11-24 – 2012-11-27 (×4): 40 mg via SUBCUTANEOUS
  Filled 2012-11-24 (×4): qty 0.4

## 2012-11-24 MED ORDER — BUDESONIDE-FORMOTEROL FUMARATE 160-4.5 MCG/ACT IN AERO
2.0000 | INHALATION_SPRAY | Freq: Two times a day (BID) | RESPIRATORY_TRACT | Status: DC
  Administered 2012-11-24 – 2012-11-27 (×6): 2 via RESPIRATORY_TRACT
  Filled 2012-11-24: qty 6

## 2012-11-24 NOTE — Progress Notes (Signed)
CARE MANAGEMENT NOTE 11/24/2012  Patient:  Alan [name], Alan [name]   Account Number:  [id number]  Date Initiated:  11/21/2012  Documentation initiated by:  Deantre Bourdon  Subjective/Objective Assessment:   pt had procedure done in or expirenced  bronchospasm and required intubation     Action/Plan:   home when stable   Anticipated DC Date:  11/27/2012   Anticipated DC Plan:  HOME/SELF CARE  In-house referral  NA      DC Planning Services  NA      [facility] Choice  NA   Choice offered to / List presented to:  NA   DME arranged  NA      DME agency  NA     HH arranged  NA      HH agency  NA   Status of service:  In process, will continue to follow Medicare Important Message given?  NA - LOS <3 / Initial given by admissions (If response is "NO", the following Medicare IM given date fields will be blank) Date Medicare IM given:   Date Additional Medicare IM given:    Discharge Disposition:    Per UR Regulation:  Reviewed for med. necessity/level of care/duration of stay  If discussed at Long Length of Stay Meetings, dates discussed:    Comments:  [id number] Earlene Plater, RN, BSN, CCM:  CHART REVIEWED AND UPDATED.  Next chart review due on [id number]. NO DISCHARGE NEEDS PRESENT AT THIS TIME. CASE MANAGEMENT [phone]   [id number] Earlene Plater, RN, BSN, CCM:  CHART REVIEWED AND UPDATED.  Next chart review due on [id number]. NO DISCHARGE NEEDS PRESENT AT THIS TIME. CASE MANAGEMENT [id number]

## 2012-11-24 NOTE — Progress Notes (Signed)
  Radiation Oncology         [phone]) [phone] ________________________________  Name: Alan [name] MRN: [id number]  Date: 11/24/2012  DOB: February 15, 1932  SIMULATION AND TREATMENT PLANNING NOTE  DIAGNOSIS:  Non-small cell lung cancer  NARRATIVE:  The patient was brought to the CT Simulation planning suite.  Identity was confirmed.  All relevant records and images related to the planned course of therapy were reviewed.   Written consent to proceed with treatment was confirmed which was freely given after reviewing the details related to the planned course of therapy had been reviewed with the patient.  Then, the patient was set-up in a stable reproducible  supine position for radiation therapy.  CT images were obtained.  Surface markings were placed.    The CT images were loaded into the planning software.  Then the target and avoidance structures were contoured.  Treatment planning then occurred.  The radiation prescription was entered and confirmed.  A total of 3 complex treatment devices were fabricated which relate to the designed radiation treatment fields. Each of these customized fields/ complex treatment devices will be used on a daily basis during the radiation course. I have requested : Isodose Plan.   PLAN:  The patient will receive 3 Gy in 1 fraction today, then followed by 5 fractions at 1.8 gray per fraction. A 60 gray 3-D conformal plan will follow if the PET scan does not show metastatic disease. If it does, then a palliative course of treatment will be used to treat the patient to approximately 35-40 gray.  ________________________________   Radene Gunning, MD, PhD

## 2012-11-24 NOTE — Progress Notes (Signed)
   Department of Radiation Oncology  Phone:  [phone] Fax:        [phone]  Weekly Treatment Note    Name: Alan [name] Date: 11/24/2012 MRN: [id number] DOB: 15-May-1932   Current dose: 3 Gy  Current fraction: 1   MEDICATIONS: No current facility-administered medications for this encounter.   No current outpatient prescriptions on file.   Facility-Administered Medications Ordered in Other Encounters  Medication Dose Route Frequency Provider Last Rate Last Dose  . albuterol (PROVENTIL) (5 MG/ML) 0.5% nebulizer solution 2.5 mg  2.5 mg Nebulization Q2H PRN Alyson Reedy, MD   2.5 mg at 11/23/12 1130  . antiseptic oral rinse (BIOTENE) solution 15 mL  15 mL Mouth Rinse QID Leslye Peer, MD   15 mL at 11/24/12 1233  . budesonide-formoterol (SYMBICORT) 160-4.5 MCG/ACT inhaler 2 puff  2 puff Inhalation BID Simonne Martinet, NP      . clobetasol ointment (TEMOVATE) 0.05 % 1 application  1 application Topical BID Simonne Martinet, NP   1 application at 11/23/12 1200  . cloNIDine (CATAPRES) tablet 0.2 mg  0.2 mg Oral TID Bernadene Person, NP   0.2 mg at 11/24/12 1104  . enoxaparin (LOVENOX) injection 40 mg  40 mg Subcutaneous Q1200 Simonne Martinet, NP   40 mg at 11/24/12 1317  . feeding supplement (GLUCERNA SHAKE) liquid 237 mL  237 mL Oral BID BM Lorraine Lax, RD   237 mL at 11/24/12 1104  . fluticasone (FLONASE) 50 MCG/ACT nasal spray 2 spray  2 spray Each Nare Daily Simonne Martinet, NP      . insulin aspart (novoLOG) injection 0-15 Units  0-15 Units Subcutaneous TID WC Simonne Martinet, NP      . Melene Muller ON 11/25/2012] methylPREDNISolone sodium succinate (SOLU-MEDROL) 125 mg/2 mL injection 60 mg  60 mg Intravenous Q12H Simonne Martinet, NP      . pantoprazole (PROTONIX) EC tablet 40 mg  40 mg Oral BID AC Nyoka Cowden, MD   40 mg at 11/24/12 6578  . promethazine (PHENERGAN) injection 6.25-12.5 mg  6.25-12.5 mg Intravenous Q15 min PRN Phillips Grout, MD      . tiotropium  [facility]) inhalation capsule 18 mcg  18 mcg Inhalation Daily Simonne Martinet, NP         ALLERGIES: Sulfa antibiotics; Xyzal; Pseudoephedrine; and Amoxicillin   LABORATORY DATA:  Lab Results  Component Value Date   WBC 8.7 11/24/2012   HGB 10.0* 11/24/2012   HCT 32.1* 11/24/2012   MCV 85.1 11/24/2012   PLT 257 11/24/2012   Lab Results  Component Value Date   NA 136 11/24/2012   K 3.0* 11/24/2012   CL 93* 11/24/2012   CO2 33* 11/24/2012   Lab Results  Component Value Date   ALT 6 11/20/2012   AST 11 11/20/2012   ALKPHOS 56 11/20/2012   BILITOT 0.3 11/20/2012     NARRATIVE: Alan [name] was seen today for weekly treatment management. The chart was checked and the patient's films were reviewed. The patient did well with his first fraction. No complaints.  PHYSICAL EXAMINATION: vitals were not taken for this visit.     alert, in no acute distress  ASSESSMENT: The patient is doing satisfactorily with treatment.  PLAN: We will continue with the patient's radiation treatment as planned.

## 2012-11-24 NOTE — Progress Notes (Signed)
IP PROGRESS NOTE  Subjective:   He is up in a chair eating breakfast. He feels better.  Objective: Vital signs in last 24 hours: Blood pressure 123/44, pulse 102, temperature 98.4 F (36.9 C), temperature source Oral, resp. rate 17, height 5\' 5"  (1.651 m), weight 154 lb 12.2 oz (70.2 kg), SpO2 97.00%.  Intake/Output from previous day: 05/15 0701 - 05/16 0700 In: 880 [P.O.:220; I.V.:410; IV Piggyback:250] Out: 2325 [Urine:2325]  Physical Exam:  Lungs: Good air movement bilaterally, no significant wheezing Cardiac: Regular rate and rhythm Extremities: No leg edema   Lab Results:  Recent Labs  11/22/12 0325 11/24/12 0335  WBC 14.8* 8.7  HGB 9.4* 10.0*  HCT 29.9* 32.1*  PLT 292 257    BMET  Recent Labs  11/22/12 0325 11/24/12 0335  NA 136 136  K 3.9 3.0*  CL 101 93*  CO2 28 33*  GLUCOSE 203* 236*  BUN 16 21  CREATININE 0.45* 0.56  CALCIUM 9.1 9.0    Studies/Results: Dg Chest Port 1 View  11/24/2012   *RADIOLOGY REPORT*  Clinical Data: Extubated. Shortness of breath.  PORTABLE CHEST - 1 VIEW  Comparison: 11/22/2012  Findings: Endotracheal tube and nasogastric tube removed.  Pulmonary vascular prominence superimposed on chronic lung changes.  Heart size within normal limits.  CT detected mediastinal adenopathy (08/18/2012) not as well delineated on the present plain film exam.  IMPRESSION: Pulmonary vascular prominence superimposed on chronic lung changes.  CT detected mediastinal adenopathy (08/18/2012) not as well delineated on the present plain film exam.   Original Report Authenticated By: Lacy Duverney, M.D.    Medications: I have reviewed the patient's current medications.  Assessment/Plan:   1. Non-small cell lung cancer-obstructing left endobronchial lesion with chest lymphadenopathy, status post a bronchoscopy/EBUS 11/20/2012  2. Respiratory failure following the bronchoscopy procedure 11/20/2012, now maintained on supplemental oxygen and bronchodilator  therapy -improved 3. Stage I colon cancer April 2014  4. Abdominal aortic aneurysm-scheduled for percutaneous graft 12/01/2012  5. Diabetes  6. Anemia  7. Markedly elevated CEA-most likely secondary to the non-small cell lung cancer    The respiratory failure and appears much improved. He has been seen in consultation by Dr. Mitzi Hansen. The plan is to begin chest radiation today. [name] appears to have stage IIIB versus IV non-small cell lung cancer. I discussed the prognosis and treatment options with [name] and his family. I recommend concurrent weekly Taxol/carboplatin chemotherapy to be given with radiation. We plan to beginning chemotherapy during the week of 11/27/2012.  I reviewed the potential toxicities associated with the Taxol/carboplatin regimen in detail with Mr. Surgeon and his family. He understands the potential for nausea/vomiting, mucositis, diarrhea, alopecia, and hematologic toxicity. We reviewed the neuropathy, bone pain, and allergic reaction associated with Taxol. We discussed the potential for allergic reaction with carboplatin.  We discussed proceeding with chemotherapy/radiation versus supportive care. He indicates that he wishes to undergo treatment for the non-small cell lung cancer.  I will see him 11/27/2012 if he remains in the hospital. We will schedule outpatient followup if he is discharged. Please call oncology as needed over the weekend.  We will need to clarify the timing of the aneurysm repair.   LOS: 4 days   Alan [name], Alan [name]  11/24/2012, 2:23 PM

## 2012-11-24 NOTE — Evaluation (Signed)
Physical Therapy Evaluation Patient Details Name: Alan [name] MRN: [id number] DOB: 24-Jan-1932 Today's Date: 11/24/2012 Time: 7829-5621 PT Time Calculation (min): 11 min  PT Assessment / Plan / Recommendation Clinical Impression  79 yo male admitted with acute exacerbation of COPD, post-op resp failure. Hx of COPD-O2 dependent, colon cancer. Intubated 5/12, extubated 5/14. New diagnosis of lung cancer. On eval, pt required Min assist for ambulation-able to ambulate ~150 feet while holding onto IV pole. Ambulated on 2L O2. Recommend HHPT vs no follow up depending on progress.     PT Assessment  Patient needs continued PT services    Follow Up Recommendations  Home health PT (depending on progress)    Does the patient have the potential to tolerate intense rehabilitation      Barriers to Discharge        Equipment Recommendations   (to be determined-may need to consider RW)    Recommendations for Other Services OT consult   Frequency Min 3X/week    Precautions / Restrictions Precautions Precautions: Fall Precaution Comments: O2 dependent-2L at home Restrictions Weight Bearing Restrictions: No   Pertinent Vitals/Pain No c/o pain      Mobility  Bed Mobility Bed Mobility: Supine to Sit;Sit to Supine Supine to Sit: 5: Supervision Sit to Supine: 5: Supervision Transfers Transfers: Sit to Stand;Stand to Sit Sit to Stand: 4: Min guard;From bed Stand to Sit: 4: Min guard;To bed Ambulation/Gait Ambulation/Gait Assistance: 4: Min assist Ambulation Distance (Feet): 150 Feet Assistive device: Rolling walker Ambulation/Gait Assistance Details: Assist to stabilize throughout ambulation-slightly unsteady. Ambulated on 2L O2. Tolerated well. Good gait speed.     Exercises     PT Diagnosis: Generalized weakness;Difficulty walking  PT Problem List: Decreased activity tolerance;Decreased mobility;Decreased balance;Decreased strength PT Treatment Interventions: Gait  training;Stair training;Functional mobility training;Therapeutic activities;Balance training;Patient/family education   PT Goals Acute Rehab PT Goals PT Goal Formulation: With patient/family Time For Goal Achievement: 12/01/12 Potential to Achieve Goals: Good Pt will go Supine/Side to Sit: with modified independence PT Goal: Supine/Side to Sit - Progress: Goal set today Pt will go Sit to Stand: with modified independence PT Goal: Sit to Stand - Progress: Goal set today Pt will Ambulate: 51 - 150 feet;with modified independence;with least restrictive assistive device PT Goal: Ambulate - Progress: Goal set today Pt will Go Up / Down Stairs: 3-5 stairs;with rail(s);with supervision PT Goal: Up/Down Stairs - Progress: Goal set today  Visit Information  Last PT Received On: 11/24/12 Assistance Needed: +1    Subjective Data  Subjective: I feel alright Patient Stated Goal: home   Prior Functioning  Home Living Lives With: Spouse Available Help at Discharge: Family Type of Home: House Home Layout: One level;Laundry or work area in [employer]: None Prior Function Level of Independence: Independent Able to Take Stairs?: Yes Communication Communication: No difficulties    Cognition  Cognition Arousal/Alertness: Awake/alert Behavior During Therapy: WFL for tasks assessed/performed Overall Cognitive Status: Within Functional Limits for tasks assessed    Extremity/Trunk Assessment Right Lower Extremity Assessment RLE ROM/Strength/Tone: Deficits RLE ROM/Strength/Tone Deficits: Strength at least 4/5 with functional mobility Left Lower Extremity Assessment LLE ROM/Strength/Tone: Deficits LLE ROM/Strength/Tone Deficits: Strength at least 4/5 with functional mobility Trunk Assessment Trunk Assessment: Normal   Balance Balance Balance Assessed: Yes Static Standing Balance Static Standing - Balance Support: Bilateral upper extremity supported;Left upper  extremity supported Static Standing - Level of Assistance: 5: Stand by assistance Dynamic Standing Balance Dynamic Standing - Balance Support: Left upper extremity supported  Dynamic Standing - Level of Assistance: 4: Min assist  End of Session PT - End of Session Equipment Utilized During Treatment: Gait belt;Oxygen Activity Tolerance: Patient tolerated treatment well Patient left: in bed;with call bell/phone within reach;with family/visitor present  GP     Rebeca Alert, MPT Pager: [phone]

## 2012-11-24 NOTE — Progress Notes (Addendum)
PULMONARY  / CRITICAL CARE MEDICINE  Name: Alan [name] MRN: [id number] DOB: 1932/03/13    ADMISSION DATE:  11/20/2012 CONSULTATION DATE:  5/12  PRIMARY SERVICE: PCCM   CHIEF COMPLAINT:  Acute resp failure   BRIEF PATIENT DESCRIPTION:  79 year old male w/ h/o COPD. Presented for scheduled EBUS on 5/12. Pre-op did have some increased SOB c/w prior visit. Underwent procedure. Of notehad sig obstruction of LMSB. Post op had marked bronchospasm, cough and acute resp failure. Failed NIPPV. Admitted to ICU for acute resp failure.   SIGNIFICANT EVENTS / STUDIES:  Cytology: 5/12: C/w adeno ca  LINES / TUBES: OETT 5/12>>>5/14  CULTURES: Sputum 5/12>>>NTD  ANTIBIOTICS: Levaquin 5/12>>>5/16  SUBJECTIVE/Overnight:  Pt overall feels well. C/O postnasal drip which is making him cough. Otherwise he feels much better.  VITAL SIGNS: Temp:  [97.1 F (36.2 C)-98.6 F (37 C)] 98.4 F (36.9 C) (05/16 0800) Pulse Rate:  [88-113] 93 (05/16 0800) Resp:  [17-27] 19 (05/16 0800) BP: (102-163)/(49-83) 128/67 mmHg (05/16 0800) SpO2:  [96 %-100 %] 98 % (05/16 0833) FiO2 (%):  [40 %] 40 % (05/15 1215) Weight:  [70.2 kg (154 lb 12.2 oz)] 70.2 kg (154 lb 12.2 oz) (05/16 0500)  INTAKE / OUTPUT: Intake/Output     05/15 0701 - 05/16 0700 05/16 0701 - 05/17 0700   P.O. 220    I.V. (mL/kg) 410 (5.8) 20 (0.3)   NG/GT     IV Piggyback 250 100   Total Intake(mL/kg) 880 (12.5) 120 (1.7)   Urine (mL/kg/hr) 2325 (1.4) 145 (0.9)   Total Output 2325 145   Net -1445 -25        Stool Occurrence 1 x     PHYSICAL EXAMINATION: General: Calm, comfortable, no distress  Neuro: Awake,alert, moves all ext to command. HEENT: [place]/AT, PERRL, EOM-I and -LAN/thyromegally. Cardiovascular:  RRR Lungs: Resps even non labored few scattered ronchi  Abdomen:  Soft, not tender, ND and +BS.  Musculoskeletal:  Intact. Skin:  Intact.  LABS:  Recent Labs Lab 11/20/12 1123  11/20/12 1212 11/20/12 1330  11/20/12 1723 11/20/12 2319 11/21/12 0346 11/22/12 0325 11/22/12 0445 11/24/12 0335  HGB  --   < > 9.5*  --   --   --  9.5* 9.4*  --  10.0*  WBC  --   < > 8.0  --   --   --  7.2 14.8*  --  8.7  PLT  --   < > 258  --   --   --  259 292  --  257  NA  --   < > 137  --   --   --  135 136  --  136  K  --   < > 3.7  --   --   --  4.2 3.9  --  3.0*  CL  --   < > 98  --   --   --  99 101  --  93*  CO2  --   < > 30  --   --   --  29 28  --  33*  GLUCOSE  --   < > 172*  --   --   --  162* 203*  --  236*  BUN  --   < > 8  --   --   --  14 16  --  21  CREATININE  --   < > 0.48*  --   --   --  0.46* 0.45*  --  0.56  CALCIUM  --   < > 9.1  --   --   --  9.1 9.1  --  9.0  MG  --   --  2.0  --   --   --   --  2.1  --  2.4  PHOS  --   --  4.3  --   --   --   --  2.7  --  3.6  AST  --   --  11  --   --   --   --   --   --   --   ALT  --   --  6  --   --   --   --   --   --   --   ALKPHOS  --   --  56  --   --   --   --   --   --   --   BILITOT  --   --  0.3  --   --   --   --   --   --   --   PROT  --   --  7.1  --   --   --   --   --   --   --   ALBUMIN  --   --  3.3*  --   --   --   --   --   --   --   APTT  --   --  37  --   --   --   --   --   --   --   INR  --   --  1.09  --   --   --   --   --   --   --   TROPONINI  --   --  <0.30  --  <0.30 <0.30  --   --   --   --   PHART 7.317*  --   --  7.366  --   --   --   --  7.415  --   PCO2ART 55.4*  --   --  51.1*  --   --   --   --  43.7  --   PO2ART 53.1*  --   --  447.0*  --   --   --   --  65.2*  --   < > = values in this interval not displayed.  Recent Labs Lab 11/22/12 1950 11/23/12 0806 11/23/12 1143 11/23/12 1733 11/24/12 0751  GLUCAP 165* 210* 192* 265* 184*   CXR: per my review, no acute cardiopulmonary process.   ASSESSMENT / PLAN:  PULMONARY A: acute on chronic resp failure in setting of AECOPD s/p EBUS.  Pt with improved respiratory status overall feeling better P:   - Wean FIO2. - Scheduled BDs. - Steroids as  ordered. - Discontinue Levofloxacin IV. - Cultures No growth to date. - QHS BiPAP.  CARDIOVASCULAR A: HTN P:  - Tele. - Continue clonidine.  RENAL A:  Responded well to diuretics overall fluid trend is net negative P:   - Trend BMP.  GASTROINTESTINAL A:  H/o GERD P:   - Tolerating regular diet.  HEMATOLOGIC/Oncology A:  Adenocarcinoma of lung P:  - Dr Truett Perna following. - Chest wall marking performed today with first radiation treatment today then chemo per H/O. - Plan to start daily radiation  treatments today.  INFECTIOUS A:  AECOPD  P:   - See dashboard.  ENDOCRINE A:  DM P:   - SSI while on steroids.  NEUROLOGIC A:  Acute agitation/anxiety (resolved) P:   PRN pain rx   TODAY'S SUMMARY: Pt remains extubated and breathing better. Following discussion with oncology will mark chest for radiation therapy and begin treatments today. Will transfer to med-surg today and anticipate discharge on 5/19   Alan Bailey, NP student 11/24/2012  9:16 AM  *Care during the described time interval was provided by me and/or other providers on the critical care team. I have reviewed this patient's available data, including medical history, events of note, physical examination and test results as part of my evaluation.  Patient seen and examined, agree with above note.  I dictated the care and orders written for this patient under my direction.  Alan Reedy, MD [phone]

## 2012-11-24 NOTE — Progress Notes (Signed)
[facility] ADULT ICU REPLACEMENT PROTOCOL FOR AM LAB REPLACEMENT ONLY  The patient does apply for the [facility] Adult ICU Electrolyte Replacment Protocol based on the criteria listed below:   1. Is GFR >/= 40 ml/min? yes  Patient's GFR today is 90 2. Is urine output >/= 0.5 ml/kg/hr for the last 6 hours? yes Patient's UOP is 1.60 ml/kg/hr 3. Is BUN < 60 mg/dL? yes  Patient's BUN today is 21 4. Abnormal electrolyte(s): K+ 3.0 5. Ordered repletion with:per protocol 6. If a panic level lab has been reported, has the CCM MD in charge been notified? yes.   Physician:  Dr. Higinio Plan, Kadarious Dikes A 11/24/2012 6:49 AM

## 2012-11-24 NOTE — Progress Notes (Signed)
[facility] Radiation Oncology Dept Therapy Treatment Record Phone [phone]   Radiation Therapy was administered to Alan [name] on: 11/24/2012  3:37 PM and was treatment # 1 out of a planned course of 1 treatments.

## 2012-11-25 DIAGNOSIS — I714 Abdominal aortic aneurysm, without rupture, unspecified: Secondary | ICD-10-CM

## 2012-11-25 DIAGNOSIS — C189 Malignant neoplasm of colon, unspecified: Secondary | ICD-10-CM

## 2012-11-25 LAB — BASIC METABOLIC PANEL
BUN: 20 mg/dL (ref 6–23)
Creatinine, Ser: 0.52 mg/dL (ref 0.50–1.35)
GFR calc Af Amer: >90 mL/min (ref >90)
GFR calc non Af Amer: >90 mL/min (ref >90)
Glucose, Bld: 175 mg/dL — ABNORMAL HIGH (ref 70–99)
Potassium: 3.6 mEq/L (ref 3.5–5.1)

## 2012-11-25 LAB — GLUCOSE, CAPILLARY: Glucose-Capillary: 158 mg/dL — ABNORMAL HIGH (ref 70–99)

## 2012-11-25 NOTE — Progress Notes (Signed)
PULMONARY  / CRITICAL CARE MEDICINE  Name: JEREMIAN WHITBY MRN: [id number] DOB: 20-Jan-1932    ADMISSION DATE:  11/20/2012 CONSULTATION DATE:  5/12  PRIMARY SERVICE: PCCM   CHIEF COMPLAINT:  Acute resp failure   BRIEF PATIENT DESCRIPTION:  79 year old male w/ h/o COPD. Presented for scheduled EBUS on 5/12. Pre-op did have some increased SOB c/w prior visit. Underwent procedure. Of notehad sig obstruction of LMSB. Post op had marked bronchospasm, cough and acute resp failure. Failed NIPPV. Admitted to ICU for acute resp failure.   SIGNIFICANT EVENTS / STUDIES:  Cytology: 5/12: C/w adeno ca  LINES / TUBES: OETT 5/12>>>5/14  CULTURES: Sputum 5/12>>>NTD  ANTIBIOTICS: Levaquin 5/12>>>5/16  SUBJECTIVE/Overnight:  Pt overall feels well. C/O postnasal drip which is making him cough. Otherwise he feels much better.  VITAL SIGNS: Temp:  [97.9 F (36.6 C)-98 F (36.7 C)] 97.9 F (36.6 C) (05/16 2100) Pulse Rate:  [83-102] 83 (05/16 2100) Resp:  [16-18] 16 (05/16 2100) BP: (102-132)/(44-86) 132/58 mmHg (05/16 2100) SpO2:  [91 %-97 %] 91 % (05/17 0900)  INTAKE / OUTPUT: Intake/Output     05/16 0701 - 05/17 0700 05/17 0701 - 05/18 0700   P.O. 120 240   I.V. (mL/kg) 120 (1.7)    IV Piggyback 300    Total Intake(mL/kg) 540 (7.7) 240 (3.4)   Urine (mL/kg/hr) 375 (0.2)    Total Output 375     Net +165 +240        Urine Occurrence 1 x     PHYSICAL EXAMINATION: General: Calm, comfortable, no distress  Neuro: Awake,alert, moves all ext to command. HEENT: [place]/AT, PERRL, EOM-I and -LAN/thyromegally. Cardiovascular:  RRR Lungs: Resps even non labored few scattered ronchi  Abdomen:  Soft, not tender, ND and +BS.  Musculoskeletal:  Intact. Skin:  Intact.  LABS:  Recent Labs Lab 11/20/12 1123  11/20/12 1212 11/20/12 1330 11/20/12 1723 11/20/12 2319 11/21/12 0346 11/22/12 0325 11/22/12 0445 11/24/12 0335 11/25/12 0410  HGB  --   < > 9.5*  --   --   --  9.5* 9.4*  --   10.0*  --   WBC  --   < > 8.0  --   --   --  7.2 14.8*  --  8.7  --   PLT  --   < > 258  --   --   --  259 292  --  257  --   NA  --   < > 137  --   --   --  135 136  --  136 136  K  --   < > 3.7  --   --   --  4.2 3.9  --  3.0* 3.6  CL  --   < > 98  --   --   --  99 101  --  93* 98  CO2  --   < > 30  --   --   --  29 28  --  33* 31  GLUCOSE  --   < > 172*  --   --   --  162* 203*  --  236* 175*  BUN  --   < > 8  --   --   --  14 16  --  21 20  CREATININE  --   < > 0.48*  --   --   --  0.46* 0.45*  --  0.56 0.52  CALCIUM  --   < >  9.1  --   --   --  9.1 9.1  --  9.0 8.9  MG  --   --  2.0  --   --   --   --  2.1  --  2.4  --   PHOS  --   --  4.3  --   --   --   --  2.7  --  3.6  --   AST  --   --  11  --   --   --   --   --   --   --   --   ALT  --   --  6  --   --   --   --   --   --   --   --   ALKPHOS  --   --  56  --   --   --   --   --   --   --   --   BILITOT  --   --  0.3  --   --   --   --   --   --   --   --   PROT  --   --  7.1  --   --   --   --   --   --   --   --   ALBUMIN  --   --  3.3*  --   --   --   --   --   --   --   --   APTT  --   --  37  --   --   --   --   --   --   --   --   INR  --   --  1.09  --   --   --   --   --   --   --   --   TROPONINI  --   --  <0.30  --  <0.30 <0.30  --   --   --   --   --   PHART 7.317*  --   --  7.366  --   --   --   --  7.415  --   --   PCO2ART 55.4*  --   --  51.1*  --   --   --   --  43.7  --   --   PO2ART 53.1*  --   --  447.0*  --   --   --   --  65.2*  --   --   < > = values in this interval not displayed.  Recent Labs Lab 11/24/12 0751 11/24/12 1202 11/24/12 1858 11/24/12 2132 11/25/12 1210  GLUCAP 184* 239* 226* 135* 191*    CXR: per my review, no acute cardiopulmonary process.    ASSESSMENT / PLAN:  PULMONARY A: acute on chronic resp failure in setting of AECOPD s/p EBUS.  Pt with improved respiratory status overall feeling better P:   - Wean FIO2. - Scheduled BDs. - Steroids as ordered. - Discontinue  Levofloxacin IV. - Cultures No growth to date. - QHS BiPAP.   CARDIOVASCULAR A: HTN P:  - Tele. - Continue clonidine.   RENAL A:  Responded well to diuretics overall fluid trend is net negative P:   - Trend BMP.   GASTROINTESTINAL A:  H/o GERD P:   - Tolerating regular diet.  HEMATOLOGIC/Oncology A:  Adenocarcinoma of lung P:  - Dr Truett Perna following. - Chest wall marking performed today with first radiation treatment today then chemo per H/O. - Plan to start daily radiation treatments today.   INFECTIOUS A:  AECOPD  P:   - See dashboard.   ENDOCRINE A:  DM P:   - SSI while on steroids.   NEUROLOGIC A:  Acute agitation/anxiety (resolved) P:   PRN pain rx     NADEL,SCOTT M, 11/25/2012  12:53 PM

## 2012-11-26 DIAGNOSIS — I739 Peripheral vascular disease, unspecified: Secondary | ICD-10-CM

## 2012-11-26 LAB — GLUCOSE, CAPILLARY
Glucose-Capillary: 177 mg/dL — ABNORMAL HIGH (ref 70–99)
Glucose-Capillary: 268 mg/dL — ABNORMAL HIGH (ref 70–99)

## 2012-11-26 NOTE — Progress Notes (Signed)
PULMONARY  / CRITICAL CARE MEDICINE  Name: Alan [name] MRN: [id number] DOB: [birth year]    ADMISSION DATE:  11/20/2012 CONSULTATION DATE:  5/12  PRIMARY SERVICE: PCCM   CHIEF COMPLAINT:  Acute resp failure   BRIEF PATIENT DESCRIPTION:  79 year old male w/ h/o COPD. Presented for scheduled EBUS on 5/12. Pre-op did have some increased SOB c/w prior visit. Underwent procedure. Of notehad sig obstruction of LMSB. Post op had marked bronchospasm, cough and acute resp failure. Failed NIPPV. Admitted to ICU for acute resp failure.   SIGNIFICANT EVENTS / STUDIES:  Cytology: 5/12: C/w adeno ca  LINES / TUBES: OETT 5/12>>>5/14  CULTURES: Sputum 5/12>>>NTD  ANTIBIOTICS: Levaquin 5/12>>>5/16  SUBJECTIVE/Overnight:  Pt overall feels well. C/O postnasal drip which is making him cough. Otherwise he feels much better.  VITAL SIGNS: Temp:  [97.5 F (36.4 C)-97.8 F (36.6 C)] 97.8 F (36.6 C) (05/18 0500) Pulse Rate:  [81-82] 81 (05/18 0500) Resp:  [16-18] 18 (05/18 0500) BP: (103-113)/(52-65) 111/65 mmHg (05/18 0500) SpO2:  [97 %-99 %] 97 % (05/18 0500)  INTAKE / OUTPUT: Intake/Output     05/17 0701 - 05/18 0700 05/18 0701 - 05/19 0700   P.O. 720 320   I.V. (mL/kg)     IV Piggyback     Total Intake(mL/kg) 720 (10.3) 320 (4.6)   Urine (mL/kg/hr)     Total Output       Net +720 +320        Urine Occurrence 3 x     PHYSICAL EXAMINATION: General: Calm, comfortable, no distress  Neuro: Awake,alert, moves all ext to command. HEENT: [place]/AT, PERRL, EOM-I and -LAN/thyromegally. Cardiovascular:  RRR Lungs: Resps even non labored few scattered ronchi  Abdomen:  Soft, not tender, ND and +BS.  Musculoskeletal:  Intact. Skin:  Intact.  LABS:  Recent Labs Lab 11/20/12 1123  11/20/12 1212 11/20/12 1330 11/20/12 1723 11/20/12 2319 11/21/12 0346 11/22/12 0325 11/22/12 0445 11/24/12 0335 11/25/12 0410  HGB  --   < > 9.5*  --   --   --  9.5* 9.4*  --  10.0*  --   WBC  --    < > 8.0  --   --   --  7.2 14.8*  --  8.7  --   PLT  --   < > 258  --   --   --  259 292  --  257  --   NA  --   < > 137  --   --   --  135 136  --  136 136  K  --   < > 3.7  --   --   --  4.2 3.9  --  3.0* 3.6  CL  --   < > 98  --   --   --  99 101  --  93* 98  CO2  --   < > 30  --   --   --  29 28  --  33* 31  GLUCOSE  --   < > 172*  --   --   --  162* 203*  --  236* 175*  BUN  --   < > 8  --   --   --  14 16  --  21 20  CREATININE  --   < > 0.48*  --   --   --  0.46* 0.45*  --  0.56 0.52  CALCIUM  --   < >  9.1  --   --   --  9.1 9.1  --  9.0 8.9  MG  --   --  2.0  --   --   --   --  2.1  --  2.4  --   PHOS  --   --  4.3  --   --   --   --  2.7  --  3.6  --   AST  --   --  11  --   --   --   --   --   --   --   --   ALT  --   --  6  --   --   --   --   --   --   --   --   ALKPHOS  --   --  56  --   --   --   --   --   --   --   --   BILITOT  --   --  0.3  --   --   --   --   --   --   --   --   PROT  --   --  7.1  --   --   --   --   --   --   --   --   ALBUMIN  --   --  3.3*  --   --   --   --   --   --   --   --   APTT  --   --  37  --   --   --   --   --   --   --   --   INR  --   --  1.09  --   --   --   --   --   --   --   --   TROPONINI  --   --  <0.30  --  <0.30 <0.30  --   --   --   --   --   PHART 7.317*  --   --  7.366  --   --   --   --  7.415  --   --   PCO2ART 55.4*  --   --  51.1*  --   --   --   --  43.7  --   --   PO2ART 53.1*  --   --  447.0*  --   --   --   --  65.2*  --   --   < > = values in this interval not displayed.  Recent Labs Lab 11/24/12 2132 11/25/12 1210 11/25/12 1641 11/26/12 0743 11/26/12 1149  GLUCAP 135* 191* 158* 177* 174*    CXR 5/16>  IMPRESSION:  Pulmonary vascular prominence superimposed on chronic lung changes.  CT detected mediastinal adenopathy (08/18/2012) not as well delineated on the present plain film exam.    ASSESSMENT / PLAN:  PULMONARY A: acute on chronic resp failure in setting of AECOPD s/p EBUS.  Pt with improved  respiratory status overall feeling better P:   - Wean FIO2. - Scheduled BDs. - Steroids as ordered. - Discontinue Levofloxacin IV. - Cultures No growth to date. - QHS BiPAP.   HEMATOLOGIC/Oncology A:  Adenocarcinoma of lung P:  - Dr Truett Perna & Mitzi Hansen following>? To arrange for XRT here 5/19&20 then hopefully disch for  f/u in [place] - Chest wall marking performed today with first radiation treatment today then chemo per H/O. - He has started XRT treatments   CARDIOVASCULAR A: HTN P:  - Tele. - Continue clonidine.   RENAL A:  Responded well to diuretics overall fluid trend is net negative P:   - Trend BMP.   GASTROINTESTINAL A:  H/o GERD P:   - Tolerating regular diet.   INFECTIOUS A:  AECOPD  P:   - See dashboard.   ENDOCRINE A:  DM P:   - SSI while on steroids.   NEUROLOGIC A:  Acute agitation/anxiety (resolved) P:   PRN pain rx     Corrin Sieling M, 11/26/2012  1:18 PM

## 2012-11-27 ENCOUNTER — Ambulatory Visit
Admit: 2012-11-27 | Discharge: 2012-11-27 | Disposition: A | Discharge status: Home / Self Care | Payer: Medicare Other | Attending: Radiation Oncology | Admitting: Radiation Oncology

## 2012-11-27 ENCOUNTER — Encounter: Payer: Self-pay | Admitting: *Deleted

## 2012-11-27 DIAGNOSIS — C3400 Malignant neoplasm of unspecified main bronchus: Principal | ICD-10-CM

## 2012-11-27 MED ORDER — PREDNISONE 10 MG PO TABS: ORAL_TABLET | ORAL | Status: DC

## 2012-11-27 NOTE — Discharge Summary (Addendum)
Physician Discharge Summary     Patient ID: Alan [name] MRN: [id number] DOB/AGE: [birth year] 79 y.o.  Admit date: 11/20/2012 Discharge date: 11/27/2012  Admission Diagnoses: Acute respiratory failure   Discharge Diagnoses:  Active Problems:   DM (diabetes mellitus) type 2   HTN (hypertension)   Cough   Acute exacerbation of chronic obstructive pulmonary disease   Acute-on-chronic respiratory failure   Acute respiratory failure   Lung cancer, main bronchus   Significant Hospital tests/ studies/ interventions and procedures  Cytology: 5/12: C/w adeno ca   LINES / TUBES:  OETT 5/12>>>5/14     ANTIBIOTICS:  Levaquin 5/12>>>5/16  Hospital Course/discharge diagnoses:  1) acute on chronic resp failure in setting of AECOPD s/p EBUS.  Pt with improved respiratory status overall feeling better 2) Adenocarcinoma of lung: new diagnosis with LMSB osbtruction  3) HTN: no changes made  4) H/o GERD 5) DM  BRIEF PATIENT DESCRIPTION:  79 year old male w/ h/o COPD. Presented for scheduled EBUS on 5/12. Pre-op did have some increased SOB c/w prior visit. Underwent procedure. Of notehad sig obstruction of LMSB. Post op had marked bronchospasm, cough and acute resp failure. Failed NIPPV. Admitted to ICU for acute resp failure.  Hospital course.  Mr Covin was admitted to the ICU directly from PACU after broncho. He was in acute distress and was unable to tolerated NIPPV, thus requiring intubation. He was treated in typical manor for acute exacerbation of COPD. This included the following: mechanical ventilation,  Sedation, systemic steroids, bronchodilators, and oxygen. He was successfully weaned and extubated on 5/14 after his bronchospasm improved. On 5/14 his surg path and cytology returned and was noted to be Adenocarcinoma (a new lung Primary). His med/oncologist was notified and radiation/oncology was consulted. At time of discharge he will have completed 2 rounds of radiation  therapy.  He has continued to improve back to baseline pulmonary status (ambulating on 2 liters n/c).  Plan at d/c: Re his COPD: Continue oxygen at 2 liters Resume home bronchodilator regimen Slow pred taper (has completed antibiotics as in-patient) F/u our office re: COPD  Re cancer:  He is currently being referred to [facility] from our heme/onc department. The plan is to complete his chemo and radiation therapy more closer to home.   Discharge Exam: BP 137/65  Pulse 88  Temp(Src) 98 F (36.7 C) (Oral)  Resp 16  Ht 5\' 5"  (1.651 m)  Wt 70.2 kg (154 lb 12.2 oz)  BMI 25.75 kg/m2  SpO2 98% 2 liters   PHYSICAL EXAMINATION:  General: Calm, comfortable, no distress  Neuro: Awake,alert, moves all ext to command.  HEENT: [place]/AT Cardiovascular: RRR  Lungs: Resps even non labored, clear to auscultation Abdomen: Soft, not tender, ND and +BS.  Musculoskeletal: Intact.  Skin: Intact.  Labs at discharge Lab Results  Component Value Date   CREATININE 0.52 11/25/2012   BUN 20 11/25/2012   NA 136 11/25/2012   K 3.6 11/25/2012   CL 98 11/25/2012   CO2 31 11/25/2012   Lab Results  Component Value Date   WBC 8.7 11/24/2012   HGB 10.0* 11/24/2012   HCT 32.1* 11/24/2012   MCV 85.1 11/24/2012   PLT 257 11/24/2012   Lab Results  Component Value Date   ALT 6 11/20/2012   AST 11 11/20/2012   ALKPHOS 56 11/20/2012   BILITOT 0.3 11/20/2012   Lab Results  Component Value Date   INR 1.09 11/20/2012   INR 1.14 10/05/2012    Current radiology  studies No results found.  Disposition:  01-Home or Self Care      Discharge Orders   Future Appointments Provider Department Dept Phone   11/27/2012 2:30 PM [facility] [place] [facility] [phone]   11/28/2012 11:00 AM [facility] [place] [facility] RAD[facility] [phone]   11/29/2012 3:15 PM [facility] [place] [facility] RAD[facility]COL[facility] [phone]   11/30/2012 3:15 PM  [facility] [place] [facility] RAD[facility]COL[facility] [phone]   12/01/2012 2:30 PM [facility] MEDICAL ONCOLOGY [phone]   12/01/2012 3:00 PM Ladene Artist, MD [facility] MEDICAL ONCOLOGY [phone]   12/01/2012 3:15 PM [facility] [place] [facility] RAD[facility] ONCOL[facility] [phone]   12/05/2012 3:15 PM [facility] [place] [facility] RAD[facility]COL[facility] [phone]   12/06/2012 11:00 AM Ileana Ladd, MD [facility] FAMILY MEDICINE [phone]   12/06/2012 3:15 PM [facility] [place] [facility] RAD[facility] ONCOLOG[facility] [phone]   12/07/2012 11:30 AM Julio Sicks, NP [place] [facility] [phone]   12/07/2012 3:15 PM [facility] [place] [facility] RAD[facility] ONCOL[facility] [phone]   12/08/2012 3:15 PM [facility] [place] [facility] RAD[facility] ONCOLOG[facility] [phone]   12/11/2012 3:15 PM [facility] [place] [facility]COL[facility] [phone]   12/12/2012 3:15 PM [facility] [place] [facility] RAD[facility] ONCOLOG[facility] [phone]   12/13/2012 1:45 PM Leslye Peer, MD [place] [facility] [phone]   12/13/2012 3:15 PM [facility] [place] [facility] RAD[facility] ONCOLOG[facility] [phone]   12/14/2012 3:15 PM [facility] [place] [facility] RAD[facility]COL[facility] [phone]   12/15/2012 3:15 PM [facility] [place] [facility] RAD[facility]COL[facility] [phone]   12/18/2012 3:15 PM [facility] [place] [facility] RAD[facility] ONCOLOG[facility] [phone]   12/19/2012 3:15 PM [facility] [place] [facility] RAD[facility]COL[facility] [phone]   12/20/2012 3:15 PM [facility] [place] [facility] RAD[facility]COL[facility] [phone]   12/21/2012 3:15 PM [facility] [place] [facility] RADI[facility] ONCOLOG[facility] [phone]   12/22/2012 3:15 PM [facility] [place]  [facility] RAD[facility]COL[facility] [phone]   12/25/2012 3:15 PM [facility] [place] [facility] RAD[facility] ONCOLOGY [phone]   12/26/2012 3:15 PM [facility] [place] [facility] RAD[facility] ONCOLOG[facility] [phone]   12/27/2012 3:15 PM [facility] [place] [facility] RAD[facility] ONCOLOGY [phone]   12/28/2012 3:15 PM [facility] [place] [facility] RADI[facility] ONCOLOGY [phone]   12/29/2012 3:15 PM [facility] [place] [facility] RADIATION ONCOLOGY [phone]   01/01/2013 3:15 PM [facility] [place] [facility] RAD[facility] ONCOLOG[facility] [phone]   01/02/2013 3:15 PM [facility] [place] [facility] RAD[facility] ONCOLOG[facility] [phone]   01/03/2013 3:15 PM [facility] [place] [facility] RADI[facility] ONCOLOG[facility] [phone]   01/04/2013 3:15 PM [facility] [place] [facility] RAD[facility] ONCOLOG[facility] [phone]   01/05/2013 3:15 PM [facility] [place] [facility] RAD[facility]COL[facility] [phone]   01/08/2013 3:15 PM [facility] [place] [facility] RAD[facility] ONCOLOG[facility] [phone]   01/09/2013 3:15 PM [facility] [place] [facility] RAD[facility] ONCOLOG[facility] [phone]   01/10/2013 3:15 PM [facility] [place] [facility] RAD[facility] ONCOL[facility] [phone]   01/11/2013 3:15 PM [facility] [place] [facility] RADIATION ONCOLOGY [phone]   01/15/2013 3:15 PM [facility] [place] [facility] RADIATION ONCOLOGY [phone]   01/16/2013 10:20 AM Ileana Ladd, MD [facility] FAMILY MEDICINE [phone]   01/16/2013 3:15 PM [facility] [place] [facility] RAD[facility]COL[facility] [phone]   Future Orders Complete By Expires     Call MD for:  temperature >100.4  As directed     Call MD for:  As directed     Scheduling Instructions:      Cough with discolored sputum Increased shortness of breath  If you have Chest  pain: you need to report to nearest ER.    Diet - low sodium heart healthy  As directed     Discharge patient  As directed     Comments:      When recovered from anesthesaia    Increase activity slowly  As directed         Medication List    TAKE these medications       albuterol (2.5 MG/3ML) 0.083% nebulizer solution  Commonly known as:  PROVENTIL  Take 2.5 mg by nebulization every 6 (six) hours as needed for wheezing or shortness of breath.     albuterol 108 (90 BASE) MCG/ACT inhaler  Commonly known as:  PROVENTIL HFA;VENTOLIN HFA  Inhale 2 puffs into the lungs every 6 (six) hours as needed for wheezing.     atorvastatin 20 MG tablet  Commonly known as:  LIPITOR  Take 20 mg by mouth every morning.     clobetasol ointment 0.05 %  Commonly known as:  TEMOVATE  Apply 1 application topically 2 (two) times daily. Apply to arms     fluticasone 50 MCG/ACT nasal spray  Commonly known as:  FLONASE  Place 2 sprays into the nose daily.     ketoconazole 2 % cream  Commonly known as:  NIZORAL  Apply 1 application topically daily. Apply to face     lisinopril 5 MG tablet  Commonly known as:  PRINIVIL,ZESTRIL  Take 5 mg by mouth every morning.     loratadine 10 MG tablet  Commonly known as:  CLARITIN  Take 10 mg by mouth daily.     metFORMIN 500 MG tablet  Commonly known as:  GLUCOPHAGE  Take 500 mg by mouth 2 (two) times daily with a meal.     multivitamin with minerals Tabs  Take 1 tablet by mouth daily.     predniSONE 10 MG tablet  Commonly known as:  DELTASONE  Take 4 tabs  daily with food x 4 days, then 3 tabs daily x 4 days, then 2 tabs daily x 4 days, then 1 tab daily x4 days then stop. #40     SPIRIVA HANDIHALER 18 MCG inhalation capsule  Generic drug:  tiotropium  Place 18 mcg into inhaler and inhale daily.     SYMBICORT 160-4.5 MCG/ACT inhaler  Generic drug:  budesonide-formoterol  Inhale 2 puffs into the lungs 2 (two) times daily.     tamsulosin 0.4 MG  Caps  Commonly known as:  FLOMAX  Take 0.4 mg by mouth daily.       Follow-up Information   Follow up with Leslye Peer., MD On 12/13/2012. (145pm)    Contact information:   [address] [place] [address] [phone]       Follow up with Thornton Papas, MD. (as planned)    Contact information:   [address] [place] [address] [phone]       Discharged Condition: good Ambulating  in hallway w/ 2 liters.   Physician Statement:   The Patient was personally examined, the discharge assessment and plan has been personally reviewed and I agree with ACNP Babcock's assessment and plan. > 30 minutes of time have been dedicated to discharge assessment, planning and discharge instructions.   Caution should be used in the future  with lisinopril in the setting of unstable and non-specific respiratory symptoms with low threshold to change to ARB class should any of these symtpoms prove difficult to control.  Sandrea Hughs, MD Pulmonary and Critical Care Medicine [facility] Cell [phone] After 5:30 PM or weekends, call [phone]

## 2012-11-27 NOTE — Progress Notes (Signed)
Physical Therapy Treatment Patient Details Name: DASHAN CHIZMAR MRN: [id number] DOB: Dec 03, 1931 Today's Date: 11/27/2012 Time: 8469-6295 PT Time Calculation (min): 11 min  PT Assessment / Plan / Recommendation Comments on Treatment Session  Performing well. Pt was Sup-Mod I with mobility this session. NO further PT needs at this time. Will sign off. Recommend daily mobility with nursing/family.     Follow Up Recommendations  No PT follow up     Does the patient have the potential to tolerate intense rehabilitation     Barriers to Discharge        Equipment Recommendations  None recommended by PT    Recommendations for Other Services    Frequency     Plan Discharge plan remains appropriate    Precautions / Restrictions Precautions Precautions: None Precaution Comments: O2 dependent-2L at home Restrictions Weight Bearing Restrictions: No   Pertinent Vitals/Pain No c/o pain    Mobility  Bed Mobility Bed Mobility: Not assessed Transfers Transfers: Sit to Stand;Stand to Sit Sit to Stand: 6: Modified independent (Device/Increase time);From bed;From chair/3-in-1 Stand to Sit: 6: Modified independent (Device/Increase time);To chair/3-in-1;To bed Ambulation/Gait Ambulation/Gait Assistance: 6: Modified independent (Device/Increase time) Ambulation Distance (Feet): 300 Feet Ambulation/Gait Assistance Details: good gait speed. no LOB O2 sats 96% on 2 L O2 Stairs: Yes Stairs Assistance: 5: Supervision Stairs Assistance Details (indicate cue type and reason): VCs safety Stair Management Technique: One rail Left Number of Stairs: 4 Wheelchair Mobility Wheelchair Mobility: No    Exercises     PT Diagnosis:    PT Problem List:   PT Treatment Interventions:     PT Goals Acute Rehab PT Goals Pt will go Sit to Stand: with modified independence PT Goal: Sit to Stand - Progress: Met Pt will Ambulate: 51 - 150 feet;with modified independence;with least restrictive assistive  device PT Goal: Ambulate - Progress: Met Pt will Go Up / Down Stairs: 3-5 stairs;with supervision;with rail(s) PT Goal: Up/Down Stairs - Progress: Met  Visit Information  Last PT Received On: 11/27/12 Assistance Needed: +1    Subjective Data  Subjective: I was getting a little restless Patient Stated Goal: home   Cognition  Cognition Arousal/Alertness: Awake/alert Behavior During Therapy: WFL for tasks assessed/performed Overall Cognitive Status: Within Functional Limits for tasks assessed    Balance     End of Session PT - End of Session Equipment Utilized During Treatment: Gait belt;Oxygen Activity Tolerance: Patient tolerated treatment well Patient left: in bed;with call bell/phone within reach;with family/visitor present (sitting EOB)   GP     Rebeca Alert, MPT Pager: [phone]

## 2012-11-27 NOTE — Progress Notes (Signed)
Spoke with pt at [facility] hospital today.  Explained to pt that I am working on getting him an appt in [place].  I have faxed referral with noted confirmation and waiting for appt.

## 2012-11-27 NOTE — Progress Notes (Signed)
Discharge instructions and medications given.  Left via wheelchair with wife to son waiting at door in car. Rubye Oaks

## 2012-11-28 ENCOUNTER — Telehealth: Payer: Self-pay | Admitting: *Deleted

## 2012-11-28 ENCOUNTER — Ambulatory Visit
Admission: RE | Admit: 2012-11-28 | Discharge: 2012-11-28 | Disposition: A | Discharge status: Home / Self Care | Payer: Medicare Other | Source: Ambulatory Visit | Attending: Radiation Oncology | Admitting: Radiation Oncology

## 2012-11-28 ENCOUNTER — Other Ambulatory Visit: Payer: Self-pay | Admitting: Radiation Oncology

## 2012-11-28 DIAGNOSIS — C189 Malignant neoplasm of colon, unspecified: Secondary | ICD-10-CM

## 2012-11-28 LAB — GLUCOSE, CAPILLARY: Glucose-Capillary: 150 mg/dL — ABNORMAL HIGH (ref 70–99)

## 2012-11-28 NOTE — Telephone Encounter (Signed)
Left VM message regarding appt in [place] for XRT 11/29/12 at 7:30.

## 2012-11-29 ENCOUNTER — Ambulatory Visit
Admit: 2012-11-29 | Discharge: 2012-11-29 | Disposition: A | Discharge status: Home / Self Care | Payer: Medicare Other | Attending: Radiation Oncology | Admitting: Radiation Oncology

## 2012-11-29 ENCOUNTER — Encounter: Payer: Medicare Other | Admitting: Internal Medicine

## 2012-11-29 DIAGNOSIS — Z85038 Personal history of other malignant neoplasm of large intestine: Secondary | ICD-10-CM

## 2012-11-29 DIAGNOSIS — C3490 Malignant neoplasm of unspecified part of unspecified bronchus or lung: Secondary | ICD-10-CM

## 2012-11-29 DIAGNOSIS — J449 Chronic obstructive pulmonary disease, unspecified: Secondary | ICD-10-CM

## 2012-11-30 ENCOUNTER — Encounter: Payer: Self-pay | Admitting: Radiation Oncology

## 2012-11-30 ENCOUNTER — Ambulatory Visit
Admit: 2012-11-30 | Discharge: 2012-11-30 | Disposition: A | Discharge status: Home / Self Care | Payer: Medicare Other | Attending: Radiation Oncology | Admitting: Radiation Oncology

## 2012-12-01 ENCOUNTER — Inpatient Hospital Stay (HOSPITAL_COMMUNITY): Admission: RE | Admit: 2012-12-01 | Payer: Medicare Other | Source: Ambulatory Visit | Admitting: Surgery

## 2012-12-01 ENCOUNTER — Encounter (HOSPITAL_COMMUNITY): Admission: RE | Payer: Self-pay | Source: Ambulatory Visit

## 2012-12-01 ENCOUNTER — Other Ambulatory Visit (HOSPITAL_BASED_OUTPATIENT_CLINIC_OR_DEPARTMENT_OTHER): Payer: Medicare Other | Admitting: Lab

## 2012-12-01 ENCOUNTER — Ambulatory Visit (HOSPITAL_BASED_OUTPATIENT_CLINIC_OR_DEPARTMENT_OTHER): Payer: Medicare Other | Admitting: Oncology

## 2012-12-01 ENCOUNTER — Other Ambulatory Visit: Payer: Self-pay | Admitting: *Deleted

## 2012-12-01 ENCOUNTER — Ambulatory Visit: Payer: Medicare Other

## 2012-12-01 VITALS — BP 124/69 | HR 98 | Temp 97.1°F | Resp 19 | Ht 65.0 in | Wt 152.9 lb

## 2012-12-01 DIAGNOSIS — C3402 Malignant neoplasm of left main bronchus: Secondary | ICD-10-CM

## 2012-12-01 DIAGNOSIS — C3400 Malignant neoplasm of unspecified main bronchus: Secondary | ICD-10-CM

## 2012-12-01 DIAGNOSIS — C3490 Malignant neoplasm of unspecified part of unspecified bronchus or lung: Secondary | ICD-10-CM

## 2012-12-01 LAB — CBC WITH DIFFERENTIAL/PLATELET
BASO%: 0.1 % (ref 0.0–2.0)
EOS%: 0 % (ref 0.0–7.0)
MCH: 27 pg — ABNORMAL LOW (ref 27.2–33.4)
MCV: 86.6 fL (ref 79.3–98.0)
MONO%: 5.2 % (ref 0.0–14.0)
RBC: 3.74 10*6/uL — ABNORMAL LOW (ref 4.20–5.82)
RDW: 16.2 % — ABNORMAL HIGH (ref 11.0–14.6)
lymph#: 0.6 10*3/uL — ABNORMAL LOW (ref 0.9–3.3)

## 2012-12-01 SURGERY — ABDOMINAL AORTIC ENDOVASCULAR FENESTRATED STENT GRAFT
Anesthesia: General

## 2012-12-01 NOTE — Progress Notes (Signed)
   [facility]    OFFICE PROGRESS NOTE   INTERVAL HISTORY:   He returns for scheduled visit. He was discharged from the hospital on 11/27/2012 after an admission with respiratory failure following a bronchoscopy. He began palliative radiation to the chest while in the hospital. Mr. Bagot and his family decided it would be more convenient for him to be treated in [place]. He has transferred his care to the [facility]. He was seen in consultation by Dr. Ubaldo Glassing. A decision was made to proceed with radiation alone. He may be a candidate for chemotherapy in the future. He is scheduled for a staging PET scan next week.  Objective:  Vital signs in last 24 hours:  Blood pressure 124/69, pulse 98, temperature 97.1 F (36.2 C), temperature source Oral, resp. rate 19, height 5\' 5"  (1.651 m), weight 152 lb 14.4 oz (69.355 kg).    HEENT: No thrush Resp: Prolonged expiratory phase, no respiratory distress Cardio: Regular rate and rhythm GI: No hepatomegaly Vascular: Trace edema at the left lower leg. No erythema or tenderness ( the patient and his family report chronic mild swelling at the left lower leg)   Lab Results:  Lab Results  Component Value Date   WBC 11.8* 12/01/2012   HGB 10.1* 12/01/2012   HCT 32.4* 12/01/2012   MCV 86.6 12/01/2012   PLT 172 12/01/2012   ANC 10.5   Medications: I have reviewed the patient's current medications.  Assessment/Plan: 1. Non-small cell lung cancer-obstructing left endobronchial lesion with chest lymphadenopathy, status post a bronchoscopy/EBUS 11/20/2012 with the pathology confirming adenocarcinoma of lung primary  2. Respiratory failure following the bronchoscopy procedure 11/20/2012, now maintained on supplemental oxygen and bronchodilator therapy -improved  3. Stage I colon cancer April 2014  4. Abdominal aortic aneurysm-planned percutaneous graft repair has been placed on hold 5. Diabetes  6. Anemia  7. Markedly elevated  CEA-most likely secondary to the non-small cell lung cancer    Disposition:  Mr. Netz appears stable. I discussed treatment options with Mr. Maffei and his family again today. He has transferred his care to the [facility]. Dr. Ubaldo Glassing will manage his medical oncology care. The plan is to proceed with radiation to the chest. Systemic chemotherapy may be considered in the future. He appears to have at least stage IIIB non-small cell lung cancer. He may have stage IV disease. He is scheduled for a PET scan next week. EGFR and ALK testing from the 11/20/2012 biopsy is pending.  Mr. Whitecotton is not scheduled for a followup appointment here. I will be glad to see him in the future as needed.   Thornton Papas, MD  12/01/2012  3:53 PM

## 2012-12-05 ENCOUNTER — Encounter (HOSPITAL_COMMUNITY): Payer: Self-pay

## 2012-12-05 ENCOUNTER — Ambulatory Visit: Payer: Medicare Other

## 2012-12-05 ENCOUNTER — Ambulatory Visit (HOSPITAL_COMMUNITY)
Admission: RE | Admit: 2012-12-05 | Discharge: 2012-12-05 | Disposition: A | Discharge status: Home / Self Care | Payer: Medicare Other | Source: Ambulatory Visit | Attending: Radiation Oncology | Admitting: Radiation Oncology

## 2012-12-05 DIAGNOSIS — M949 Disorder of cartilage, unspecified: Secondary | ICD-10-CM | POA: Insufficient documentation

## 2012-12-05 DIAGNOSIS — X58XXXA Exposure to other specified factors, initial encounter: Secondary | ICD-10-CM | POA: Insufficient documentation

## 2012-12-05 DIAGNOSIS — I714 Abdominal aortic aneurysm, without rupture, unspecified: Secondary | ICD-10-CM | POA: Insufficient documentation

## 2012-12-05 DIAGNOSIS — C770 Secondary and unspecified malignant neoplasm of lymph nodes of head, face and neck: Secondary | ICD-10-CM | POA: Insufficient documentation

## 2012-12-05 DIAGNOSIS — R918 Other nonspecific abnormal finding of lung field: Secondary | ICD-10-CM | POA: Insufficient documentation

## 2012-12-05 DIAGNOSIS — C771 Secondary and unspecified malignant neoplasm of intrathoracic lymph nodes: Secondary | ICD-10-CM | POA: Insufficient documentation

## 2012-12-05 DIAGNOSIS — C189 Malignant neoplasm of colon, unspecified: Secondary | ICD-10-CM | POA: Insufficient documentation

## 2012-12-05 DIAGNOSIS — C772 Secondary and unspecified malignant neoplasm of intra-abdominal lymph nodes: Secondary | ICD-10-CM | POA: Insufficient documentation

## 2012-12-05 DIAGNOSIS — S2239XA Fracture of one rib, unspecified side, initial encounter for closed fracture: Secondary | ICD-10-CM | POA: Insufficient documentation

## 2012-12-05 DIAGNOSIS — M899 Disorder of bone, unspecified: Secondary | ICD-10-CM | POA: Insufficient documentation

## 2012-12-05 LAB — GLUCOSE, CAPILLARY: Glucose-Capillary: 132 mg/dL — ABNORMAL HIGH (ref 70–99)

## 2012-12-05 MED ORDER — FLUDEOXYGLUCOSE F - 18 (FDG) INJECTION
17.8000 | Freq: Once | INTRAVENOUS | Status: AC | PRN
  Administered 2012-12-05: 17.8 via INTRAVENOUS

## 2012-12-06 ENCOUNTER — Ambulatory Visit: Payer: Medicare Other

## 2012-12-06 ENCOUNTER — Ambulatory Visit: Payer: Self-pay | Admitting: Family Medicine

## 2012-12-07 ENCOUNTER — Inpatient Hospital Stay: Payer: Medicare Other | Admitting: Adult Health

## 2012-12-07 ENCOUNTER — Ambulatory Visit: Payer: Medicare Other

## 2012-12-08 ENCOUNTER — Ambulatory Visit: Payer: Medicare Other

## 2012-12-08 DIAGNOSIS — C7952 Secondary malignant neoplasm of bone marrow: Secondary | ICD-10-CM

## 2012-12-08 DIAGNOSIS — C180 Malignant neoplasm of cecum: Secondary | ICD-10-CM

## 2012-12-08 DIAGNOSIS — C7951 Secondary malignant neoplasm of bone: Secondary | ICD-10-CM

## 2012-12-11 ENCOUNTER — Telehealth: Payer: Self-pay | Admitting: *Deleted

## 2012-12-11 ENCOUNTER — Ambulatory Visit: Payer: Medicare Other

## 2012-12-11 NOTE — Telephone Encounter (Signed)
Family meeting this weekend and patient/family want to transfer all his care back to [place]. Do not want to continue the radiation in [place] either. Currently in his 2nd week of radiation. Reports Dr. Cleone Slim told them his treatment should be stopped and they are upset about this. Wants radiation moved to [facility] and to see Dr. Truett Perna again. Made her aware that Dr. Truett Perna will be notified and she will be contacted after he has had opportunity to review records.

## 2012-12-12 ENCOUNTER — Telehealth: Payer: Self-pay | Admitting: *Deleted

## 2012-12-12 ENCOUNTER — Ambulatory Visit: Payer: Medicare Other

## 2012-12-12 NOTE — Telephone Encounter (Signed)
Attempted to reach patient without success.  Left Voice message to return call.

## 2012-12-13 ENCOUNTER — Ambulatory Visit: Payer: Medicare Other | Admitting: Emergency Medicine

## 2012-12-13 ENCOUNTER — Ambulatory Visit
Admission: RE | Admit: 2012-12-13 | Discharge: 2012-12-13 | Disposition: A | Discharge status: Home / Self Care | Payer: Medicare Other | Source: Ambulatory Visit | Attending: Radiation Oncology | Admitting: Radiation Oncology

## 2012-12-13 ENCOUNTER — Ambulatory Visit: Payer: Medicare Other

## 2012-12-13 VITALS — BP 98/56 | HR 107 | Temp 98.1°F | Ht 65.0 in | Wt 154.0 lb

## 2012-12-13 DIAGNOSIS — C3402 Malignant neoplasm of left main bronchus: Secondary | ICD-10-CM

## 2012-12-13 NOTE — Progress Notes (Signed)
Please see the Nurse Progress Note in the MD Initial Consult Encounter for this patient. 

## 2012-12-13 NOTE — Progress Notes (Signed)
Alan [name] here with his family for follow up break appointment.  He has been receiving radiation to his left lung and right thigh in [place].  He would like to switch back to [facility].  He will have treatment at [facility] through Friday.  He also had a pet scan last week and would like to go over the results.  He denies pain.  Is on 2l of oxygen with an oxygen saturation of 96% today.

## 2012-12-14 ENCOUNTER — Ambulatory Visit: Payer: Medicare Other

## 2012-12-14 NOTE — Addendum Note (Signed)
Encounter addended by: Keyanna Sandefer R Odyssey Vasbinder, RN on: 12/14/2012  5:23 PM<BR>     Documentation filed: Charges VN

## 2012-12-14 NOTE — Progress Notes (Signed)
Radiation Oncology         [phone] ________________________________  Name: Alan [name] MRN: [id number]  Date: 12/13/2012  DOB: May 24, 1932  Follow-Up Visit Note  CC: Redmond Baseman, MD  Ladene Artist, MD  Diagnosis:    1. metastatic non-small cell lung cancer. 2. history of colon cancer   Interval Since Last Radiation:  The patient is continuing radiation treatment currently in [place] where his course of treatment was transferred   Narrative:  The patient wanted to be seen today to review his overall case. The patient was initially treated here to the chest in the setting of substantial difficulty breathing and mass effect on the major airways. He began treatment as an inpatient and his breathing has substantially improved according to the patient today. He does use supplemental oxygen when he leaves the house but does not always use this at. The patient's workup has not been completed. He began treatment here. This was accounted for in his treatment plan. The patient expressed an interest in continuing radiation treatment in [place] and this was arranged. He is nearing the completion of thoracic radiotherapy care. He also was arranged to see Dr. Ubaldo Glassing there also and they discussed possible chemotherapy.  The patient's PET scan unfortunately showed metastatic disease. Notable on my personal review of the scan was to bony lesions including the right femur and the right mandible. The patient has completed a short course of radiotherapy to the femur. He denies any pain and no radiotherapy is planned to the mandible. Again, he denies any pain in this area, pain on swallowing or difficulties swallowing. It appears that his treatment has gone well so far there.  The patient is accompanied by multiple family members today. Your primary concern is regarding his overall status and the overall treatment plan. They indicate that chemotherapy was being considered when they spoke with Dr.  Ubaldo Glassing initially. The patient was seen in medical oncology and Dr. Cleone Slim staffed the clinic that day.  His review of the case and his impression of the patient that day was that chemotherapy would not be sufficiently helpful for him. He discussed this with the family. However, this was a substantial change from there previous understanding of the plan and this greatly concerned them, especially given that they did not have a prior relationship with Dr. Cleone Slim. They therefore have requested to see me today and also to see Dr. Truett Perna early next week given that we initially evaluated the patient and had discussed possible treatment options with them at presentation.                             ALLERGIES:  is allergic to sulfa antibiotics; xyzal; pseudoephedrine; and amoxicillin.  Meds: Current Outpatient Prescriptions  Medication Sig Dispense Refill  . albuterol (PROVENTIL HFA;VENTOLIN HFA) 108 (90 BASE) MCG/ACT inhaler Inhale 2 puffs into the lungs every 6 (six) hours as needed for wheezing.      Marland Kitchen albuterol (PROVENTIL) (2.5 MG/3ML) 0.083% nebulizer solution Take 2.5 mg by nebulization every 6 (six) hours as needed for wheezing or shortness of breath.       Marland Kitchen atorvastatin (LIPITOR) 20 MG tablet Take 20 mg by mouth every morning.       . clobetasol ointment (TEMOVATE) 0.05 % Apply 1 application topically 2 (two) times daily. Apply to arms      . fluticasone (FLONASE) 50 MCG/ACT nasal spray Place 2 sprays into the nose  daily.       . ketoconazole (NIZORAL) 2 % cream Apply 1 application topically daily. Apply to face      . lisinopril (PRINIVIL,ZESTRIL) 5 MG tablet Take 5 mg by mouth every morning.       . loratadine (CLARITIN) 10 MG tablet Take 10 mg by mouth daily.      . metFORMIN (GLUCOPHAGE) 500 MG tablet Take 500 mg by mouth 2 (two) times daily with a meal.       . Multiple Vitamin (MULTIVITAMIN WITH MINERALS) TABS Take 1 tablet by mouth daily.      . predniSONE (DELTASONE) 10 MG tablet Take 4  tabs  daily with food x 4 days, then 3 tabs daily x 4 days, then 2 tabs daily x 4 days, then 1 tab daily x4 days then stop. #40  40 tablet  0  . SPIRIVA HANDIHALER 18 MCG inhalation capsule Place 18 mcg into inhaler and inhale daily.       . SYMBICORT 160-4.5 MCG/ACT inhaler Inhale 2 puffs into the lungs 2 (two) times daily.       . tamsulosin (FLOMAX) 0.4 MG CAPS Take 0.4 mg by mouth daily.        No current facility-administered medications for this encounter.    Physical Findings: The patient is in no acute distress. Patient is alert and oriented.  height is 5\' 5"  (1.651 m) and weight is 154 lb (69.854 kg). His temperature is 98.1 F (36.7 C). His blood pressure is 98/56 and his pulse is 107. His oxygen saturation is 96%. .   General: Well-developed, in no acute distress HEENT: Normocephalic, atraumatic Cardiovascular: Regular rate and rhythm Respiratory: Clear to auscultation bilaterally; the patient is on supplemental oxygen with unlabored breathing GI: Soft, nontender, normal bowel sounds Extremities: No edema present   Lab Findings: Lab Results  Component Value Date   WBC 11.8* 12/01/2012   HGB 10.1* 12/01/2012   HCT 32.4* 12/01/2012   MCV 86.6 12/01/2012   PLT 172 12/01/2012     Radiographic Findings: Nm Pet Image Initial (pi) Skull Base To Thigh  12/05/2012   *RADIOLOGY REPORT*  Clinical Data: Initial treatment strategy for colon cancer.  NUCLEAR MEDICINE PET SKULL BASE TO THIGH  Fasting Blood Glucose:  132  Technique:  17.8 mCi F-18 FDG was injected intravenously. CT data was obtained and used for attenuation correction and anatomic localization only.  (This was not acquired as a diagnostic CT examination.) Additional exam technical data entered on technologist worksheet.  Comparison:  Multiple exams, including 11/30/2012 and 09/26/2012  Findings:  Neck: Confluent right station III and station IV adenopathy, maximum standard uptake value 10.8.  Left station III and station for  adenopathy, maximum standard uptake value 8.6.  Index right station III lymph node 1.7 cm in short axis, image 50 of series 2.  Chest:  Prevascular, AP window, right paratracheal, and subcarinal adenopathy observed, with index right lower paratracheal activity with maximum standard uptake values 7.9.  Descending periaortic node short axis measurement 2 cm, maximum standard uptake value 4.8 faint metabolic activity tracking along the left posterior rib fractures, these are probably post-traumatic rather than pathologic.  Several tiny nodules in the right upper lobe, right middle lobe, and left upper lobe do not demonstrate hypermetabolic activity but are too small to characterize, well under 7 mm in diameter.  Abdomen/Pelvis:  Aortocaval lymph nodes adjacent to the fusiform infrarenal abdominal aortic aneurysm, for note of 1.1 cm in short axis with  maximum standard uptake value of 8.5, and the lower noted with short axis diameter 1.2 cm and maximum standard uptake value 8.4.  Maximum aneurysm dimension 6.1 cm, extends nearly down to the bifurcation.  Postoperative appearing activity noted along the laparotomy scar. Focally high activity in the vicinity of the anus may be physiologic but is nonspecific.  Postoperative findings in the vicinity of the cecum.  Skeleton:  Lytic destructive lesion of the right mandibular angle with associated hypermetabolic mass, maximum standard uptake value 8.9.  Lytic destructive lesion on the inferior most images in the right proximal femoral shaft cortex medially.  This is on the bottom most images but there does appear to be some associated hypermetabolic activity, difficult to measure.  IMPRESSION:  1. Hypermetabolic metastatic disease involving nodal stations in the neck bilaterally, the mediastinum, and the retroperitoneum. Lytic hypermetabolic bony lesions in the right mandible and right proximal femoral diaphysis (this latter lesion is only partially included). 2.  6.1 cm  infrarenal fusiform abdominal aortic aneurysm. 3.  There are several tiny lung nodules in the upper lobes and right middle lobe, technically too small to characterize by PET CT.   Original Report Authenticated By: Gaylyn Rong, M.D.   Dg Chest Port 1 View  11/24/2012   *RADIOLOGY REPORT*  Clinical Data: Extubated. Shortness of breath.  PORTABLE CHEST - 1 VIEW  Comparison: 11/22/2012  Findings: Endotracheal tube and nasogastric tube removed.  Pulmonary vascular prominence superimposed on chronic lung changes.  Heart size within normal limits.  CT detected mediastinal adenopathy (08/18/2012) not as well delineated on the present plain film exam.  IMPRESSION: Pulmonary vascular prominence superimposed on chronic lung changes.  CT detected mediastinal adenopathy (08/18/2012) not as well delineated on the present plain film exam.   Original Report Authenticated By: Lacy Duverney, M.D.   Dg Chest Port 1 View  11/22/2012   *RADIOLOGY REPORT*  Clinical Data: Endotracheal tube placement  PORTABLE CHEST - 1 VIEW  Comparison: 11/21/2012  Findings: 0503 hours.  Endotracheal tube tip is 4.1 cm above the base of the carina.  The patient is rotated to the right. Interstitial markings are diffusely coarsened with chronic features. The NG tube passes into the stomach although the distal tip position is not included on the film.  No airspace pulmonary edema or focal lung consolidation. Telemetry leads overlie the chest.  IMPRESSION: Rotated film.  Underlying chronic interstitial changes and associated degree of mild interstitial edema not excluded.   Original Report Authenticated By: Kennith Center, M.D.   Dg Chest Port 1 View  11/21/2012   *RADIOLOGY REPORT*  Clinical Data: Respiratory failure  PORTABLE CHEST - 1 VIEW  Comparison: 11/20/2012  Findings: Endotracheal tube 2.7 cm above the carina.  NG tube enters the stomach with the tip not visualized.  Monitor leads overlie the chest.  Skin folds overlie the upper lobes.   Slight improvement in lung volumes with resolving mild interstitial edema pattern.  No effusion or pneumothorax.  No new collapse or consolidation.  Stable chronic bronchitic and interstitial pattern.  IMPRESSION: Improving mild interstitial edema pattern.  Stable support apparatus.   Original Report Authenticated By: Judie Petit. Miles Costain, M.D.   Portable Chest Xray  11/20/2012   *RADIOLOGY REPORT*  Clinical Data: 79 year old male intubated.  Acute-on-chronic exacerbation of obstructive pulmonary disease.  Acute-on-chronic respiratory failure.  PORTABLE CHEST - 1 VIEW  Comparison: 1135 hours the same day and earlier.  Findings: Portable semi upright AP view at 1305 hours. Endotracheal tube projects over the midline,  tip about halfway between clavicles and carina.  Mildly lower lung volumes.  Stable cardiac size and mediastinal contours.  No pneumothorax or pleural effusion.  Otherwise stable ventilation. External artifact projects over the right shoulder.  IMPRESSION: 1.  Intubated with endotracheal tube tip in good position. 2.  Mildly lower lung volumes.   Original Report Authenticated By: Erskine Speed, M.D.   Dg Chest Port 1 View  11/20/2012   *RADIOLOGY REPORT*  Clinical Data: Status post endoscopy.  PORTABLE CHEST - 1 VIEW  Comparison: 11/12/2012  Findings: 1135 hours.  No evidence for pneumothorax. The cardiopericardial silhouette is within normal limits for size. Mild increase in vascular congestion noted without overt airspace pulmonary edema. Telemetry leads overlie the chest.  Multiple left- sided rib fractures are evident.  IMPRESSION: Stable.  Mild increase in vascular congestion.  Otherwise no acute cardiopulmonary findings.   Original Report Authenticated By: Kennith Center, M.D.    Impression:    The patient has metastatic lung cancer which was clear on his staging PET scan. This was new information that we did not have when he was originally evaluated in [place]. I reviewed the patient's overall case in  detail, including our original plan in what this was based on through the most recent information. I offered to show the family the images from the PET scan but they declined. We did however discuss the report and findings in detail.   The patient appears comfortable with continuing/finishing his radiation treatment in [place]. This appears to have gone well. As I discussed with them, I believe that the major decision at this point is whether chemotherapy is appropriate for him at this time. They are seeing Dr. Truett Perna and they welcome an additional opinion from him in this regard.  Is an is potential orthopedic evaluation of the lesion within the femur. This may depend on what is decided in terms of his overall treatment plan. He has received palliative radiotherapy to this area which would be expected to halt any further changes. If he proceeds with supportive measures alone, then this may not be necessary if they are not proceeding with aggressive management. If he does proceed with chemotherapy, and this is something that could be considered as part of his overall management.  Plan:  The patient was not scheduled for any definite followup. I would be happy to see the patient back in the future but currently the patient is continuing treatment in [place].   Radene Gunning, M.D., Ph.D.

## 2012-12-14 NOTE — Addendum Note (Signed)
Encounter addended by: Eduardo Osier, RN on: 12/14/2012  5:23 PM<BR>     Documentation filed: Charges VN

## 2012-12-15 ENCOUNTER — Ambulatory Visit: Payer: Medicare Other

## 2012-12-18 ENCOUNTER — Ambulatory Visit (HOSPITAL_BASED_OUTPATIENT_CLINIC_OR_DEPARTMENT_OTHER): Payer: Medicare Other | Admitting: Oncology

## 2012-12-18 ENCOUNTER — Telehealth: Payer: Self-pay | Admitting: Oncology

## 2012-12-18 ENCOUNTER — Ambulatory Visit: Payer: Medicare Other

## 2012-12-18 VITALS — BP 140/69 | HR 117 | Temp 98.2°F | Resp 20 | Ht 65.0 in | Wt 153.7 lb

## 2012-12-18 DIAGNOSIS — C3402 Malignant neoplasm of left main bronchus: Secondary | ICD-10-CM

## 2012-12-18 DIAGNOSIS — C3400 Malignant neoplasm of unspecified main bronchus: Secondary | ICD-10-CM

## 2012-12-18 NOTE — Progress Notes (Signed)
    Cancer Center    OFFICE PROGRESS NOTE   INTERVAL HISTORY:   Mr. Alan [name] completed radiation on 12/15/2012. He and his family decided to return to [facility] for medical oncology care. He continues to have dyspnea. No pain.  Objective:  Vital signs in last 24 hours:  Blood pressure 140/69, pulse 117, temperature 98.2 F (36.8 C), temperature source Oral, resp. rate 20, height 5\' 5"  (1.651 m), weight 153 lb 11.2 oz (69.718 kg).    HEENT: neck without mass Lymph nodes: Firm 1 cm right supraclavicular node Resp: good air movement bilaterally, mild expiratory wheeze Cardio: regular rate and rhythm GI: no hepatomegaly Vascular: no leg edema Musculoskeletal: Full range of motion at the right hip without pain, no tenderness at the right mandible, no mass     X-rays PET scan 12/05/2012-multiple hypermetabolic neck nodes, hypermetabolic mediastinal nodes,tiny nodules in the lungs do not demonstrate hypermetabolic activity and are too small to characterize. Hypermetabolic aortocaval nodes.lytic destructive lesion at the right mandible and right proximal femur. Medications: I have reviewed the patient's current medications.  Assessment/Plan: 1.Non-small cell lung cancer-obstructing left endobronchial lesion with chest lymphadenopathy, status post a bronchoscopy/EBUS 11/20/2012 with the pathology confirming adenocarcinoma of lung primary , ALK negative -Staging PET scan 12/05/2012 consistent with metastatic neck/chest adenopathy, retroperitoneal adenopathy, and bone metastases -Status post palliative radiation to the left chest completed 12/15/2012 2. Respiratory failure following the bronchoscopy procedure 11/20/2012, now maintained on supplemental oxygen and bronchodilator therapy -improved  3. Stage I colon cancer April 2014  4. Abdominal aortic aneurysm-planned percutaneous graft repair has been placed on hold  5. Diabetes  6. Anemia  7. Markedly elevated CEA-most  likely secondary to the non-small cell lung cancer    Disposition:  Mr. Alan [name] has metastatic non-small cell lung cancer. He has completed palliative radiation to the chest.I discussed the prognosis and treatment options at length with Mr. Alan [name] and his family. He understands no therapy will be curative. We will followup on the EGFR testing to be sure he is not a candidate for treatment with  Tarceva.  We discussed supportive care/observation versus a trial of systemic chemotherapy. I described the predicted median survival benefit with systemic chemotherapy in this setting. Mr. Alan [name] indicated he does not wish to receive chemotherapy. He prefers a comfort/supportive care approach. He agrees to enrollment in the [facility] hospice program. We reviewed CPR and ACLS issues. He will be placed on a no CODE BLUE status.  Mr. Alan [name] has completed palliative radiation to the right femur. There is a destructive lesion at the right femur with the potential for a fracture. His overall prognosis is poor  with the oxygen dependent COPD and metastatic non-small cell lung cancer.We decided against an orthopedics referral.   Mr. Alan [name] will return for an office visit in one month. He will contact us in the interim for new symptoms.   Thornton Papas, MD  12/18/2012  7:56 PM

## 2012-12-18 NOTE — Telephone Encounter (Signed)
gv and printed appt sched and avs for pt  °

## 2012-12-19 ENCOUNTER — Telehealth: Payer: Self-pay | Admitting: *Deleted

## 2012-12-19 ENCOUNTER — Telehealth: Payer: Self-pay | Admitting: Oncology

## 2012-12-19 ENCOUNTER — Ambulatory Visit: Payer: Medicare Other

## 2012-12-19 DIAGNOSIS — C3400 Malignant neoplasm of unspecified main bronchus: Secondary | ICD-10-CM

## 2012-12-19 NOTE — Telephone Encounter (Signed)
[facility] referral sent to [facility] for records to be faxed.

## 2012-12-19 NOTE — Telephone Encounter (Signed)
Faxed pt medical records to hospice

## 2012-12-20 ENCOUNTER — Ambulatory Visit: Payer: Medicare Other

## 2012-12-20 ENCOUNTER — Telehealth: Payer: Self-pay | Admitting: *Deleted

## 2012-12-20 NOTE — Telephone Encounter (Signed)
Message from [facility] with [name]. Pt declined services for now. Wants to wait a few weeks. Will make MD aware.

## 2012-12-21 ENCOUNTER — Ambulatory Visit: Payer: Medicare Other

## 2012-12-22 ENCOUNTER — Ambulatory Visit: Payer: Medicare Other

## 2012-12-25 ENCOUNTER — Ambulatory Visit: Payer: Medicare Other

## 2012-12-26 ENCOUNTER — Ambulatory Visit: Payer: Medicare Other

## 2012-12-26 ENCOUNTER — Emergency Department (HOSPITAL_COMMUNITY): Payer: Medicare Other

## 2012-12-26 ENCOUNTER — Other Ambulatory Visit: Payer: Self-pay

## 2012-12-26 ENCOUNTER — Inpatient Hospital Stay (HOSPITAL_COMMUNITY)
Admission: EM | Admit: 2012-12-26 | Discharge: 2012-12-28 | DRG: 193 | Disposition: A | Discharge status: Home / Self Care | Payer: Medicare Other | Attending: Internal Medicine | Admitting: Internal Medicine

## 2012-12-26 ENCOUNTER — Encounter (HOSPITAL_COMMUNITY): Payer: Self-pay | Admitting: *Deleted

## 2012-12-26 DIAGNOSIS — R609 Edema, unspecified: Secondary | ICD-10-CM

## 2012-12-26 DIAGNOSIS — E1110 Type 2 diabetes mellitus with ketoacidosis without coma: Secondary | ICD-10-CM | POA: Diagnosis present

## 2012-12-26 DIAGNOSIS — C3400 Malignant neoplasm of unspecified main bronchus: Secondary | ICD-10-CM | POA: Diagnosis present

## 2012-12-26 DIAGNOSIS — J9600 Acute respiratory failure, unspecified whether with hypoxia or hypercapnia: Secondary | ICD-10-CM

## 2012-12-26 DIAGNOSIS — E440 Moderate protein-calorie malnutrition: Secondary | ICD-10-CM | POA: Diagnosis present

## 2012-12-26 DIAGNOSIS — J441 Chronic obstructive pulmonary disease with (acute) exacerbation: Secondary | ICD-10-CM | POA: Diagnosis present

## 2012-12-26 DIAGNOSIS — R600 Localized edema: Secondary | ICD-10-CM | POA: Diagnosis present

## 2012-12-26 DIAGNOSIS — C189 Malignant neoplasm of colon, unspecified: Secondary | ICD-10-CM

## 2012-12-26 DIAGNOSIS — J9620 Acute and chronic respiratory failure, unspecified whether with hypoxia or hypercapnia: Secondary | ICD-10-CM

## 2012-12-26 DIAGNOSIS — J189 Pneumonia, unspecified organism: Secondary | ICD-10-CM

## 2012-12-26 DIAGNOSIS — I714 Abdominal aortic aneurysm, without rupture: Secondary | ICD-10-CM

## 2012-12-26 DIAGNOSIS — J449 Chronic obstructive pulmonary disease, unspecified: Secondary | ICD-10-CM

## 2012-12-26 DIAGNOSIS — E1310 Other specified diabetes mellitus with ketoacidosis without coma: Secondary | ICD-10-CM

## 2012-12-26 DIAGNOSIS — I10 Essential (primary) hypertension: Secondary | ICD-10-CM | POA: Diagnosis present

## 2012-12-26 DIAGNOSIS — C3402 Malignant neoplasm of left main bronchus: Secondary | ICD-10-CM

## 2012-12-26 HISTORY — DX: Malignant neoplasm of pharynx, unspecified: C14.0

## 2012-12-26 LAB — COMPREHENSIVE METABOLIC PANEL
ALT: 10 U/L (ref 0–53)
AST: 22 U/L (ref 0–37)
CO2: 28 mEq/L (ref 19–32)
Calcium: 8.9 mg/dL (ref 8.4–10.5)
Chloride: 95 mEq/L — ABNORMAL LOW (ref 96–112)
GFR calc non Af Amer: >90 mL/min (ref >90)
Potassium: 4.1 mEq/L (ref 3.5–5.1)
Sodium: 133 mEq/L — ABNORMAL LOW (ref 135–145)

## 2012-12-26 LAB — BLOOD GAS, ARTERIAL
Acid-Base Excess: 5 mmol/L — ABNORMAL HIGH (ref 0.0–2.0)
Drawn by: 232811
O2 Content: 2 L/min
pCO2 arterial: 38.9 mmHg (ref 35.0–45.0)
pH, Arterial: 7.478 — ABNORMAL HIGH (ref 7.350–7.450)
pO2, Arterial: 67.7 mmHg — ABNORMAL LOW (ref 80.0–100.0)

## 2012-12-26 LAB — CBC WITH DIFFERENTIAL/PLATELET
Basophils Absolute: 0 10*3/uL (ref 0.0–0.1)
Eosinophils Relative: 1 % (ref 0–5)
Lymphocytes Relative: 9 % — ABNORMAL LOW (ref 12–46)
Neutro Abs: 4.1 10*3/uL (ref 1.7–7.7)
Neutrophils Relative %: 81 % — ABNORMAL HIGH (ref 43–77)
Platelets: 345 10*3/uL (ref 150–400)
RDW: 16.6 % — ABNORMAL HIGH (ref 11.5–15.5)
WBC: 5.1 10*3/uL (ref 4.0–10.5)

## 2012-12-26 LAB — POCT I-STAT TROPONIN I

## 2012-12-26 MED ORDER — SODIUM CHLORIDE 0.9 % IV SOLN
INTRAVENOUS | Status: DC
  Administered 2012-12-26: 20:00:00 via INTRAVENOUS

## 2012-12-26 MED ORDER — LEVOFLOXACIN IN D5W 750 MG/150ML IV SOLN
750.0000 mg | INTRAVENOUS | Status: DC
  Administered 2012-12-26 – 2012-12-27 (×2): 750 mg via INTRAVENOUS
  Filled 2012-12-26 (×3): qty 150

## 2012-12-26 MED ORDER — METHYLPREDNISOLONE SODIUM SUCC 125 MG IJ SOLR
80.0000 mg | Freq: Once | INTRAMUSCULAR | Status: AC
  Administered 2012-12-26: 80 mg via INTRAVENOUS
  Filled 2012-12-26: qty 2

## 2012-12-26 MED ORDER — ALBUTEROL SULFATE (5 MG/ML) 0.5% IN NEBU
5.0000 mg | INHALATION_SOLUTION | Freq: Once | RESPIRATORY_TRACT | Status: AC
  Administered 2012-12-26: 5 mg via RESPIRATORY_TRACT
  Filled 2012-12-26: qty 1

## 2012-12-26 NOTE — H&P (Signed)
[facility] History and Physical  Alan [name]:[id number] DOB: 1931-11-11 DOA: 12/26/2012  Referring physician: Dr. Freida Busman PCP: Redmond Baseman, MD  Specialists: none  Chief Complaint: SOB  HPI: Alan [name] is a 79 y.o. male  With past medical history of COPD O2 dependent, requiring intubation after bronchoscopy on 11/20/2012, non-small cell lung cancer obstructing the left bronchus, history of stage I colon cancer in April 2014, abdominal aortic aneurysm status post percutaneous graft on 5/23 2014, diabetes that comes in for shortness of breath that started 2 weeks prior to admission progressively getting worse to the point where he can't even go to the bathroom without getting short of breath. He uses oxygen at home and has tried to increase his oxygen at home for the past week with some improvement until today. He relates 2 days prior to admission he started getting a nonproductive cough with no fevers. He also started vomiting or extremity edema one week prior to admission. He relates no history of traveling more than 4 hours sitting down, or recent trauma to the legs. He relates weight loss but no fevers  In the ED: A chest x-ray was done that showed Blunting at the right costophrenic angle may represent pleural  fluid. Prominent lung markings in the right lower lung could, complete metabolic panel was done that showed hyponatremia with hypochloremia and an albumin of 2.3, CBC shows a white count of 5.1 with hemoglobin of 9.3.  Review of Systems: The patient denies anorexia, fever, weight loss,, vision loss, decreased hearing, hoarseness, chest pain, syncope, peripheral edema, balance deficits, hemoptysis, abdominal pain, melena, hematochezia, severe indigestion/heartburn, hematuria, incontinence, genital sores, muscle weakness, suspicious skin lesions, transient blindness, difficulty walking, depression, unusual weight change, abnormal bleeding, enlarged lymph nodes,  angioedema, and breast masses.   Past Medical History  Diagnosis Date  . DM (diabetes mellitus) type 2 09/18/2012  . AAA (abdominal aortic aneurysm)   . COPD (chronic obstructive pulmonary disease)   . Bronchitis   . Hypertension     borderline   . Shortness of breath     with exertion   . Sleep apnea     sleeps with oxygen at 2.5 L/[place]   . Acute sinusitis     10/03/12   . History of kidney stones     many yrs ago  . Cancer     colon cancer s/p surgery 4'14, skin cancer  . Anemia requiring transfusions 11-16-12    last 10-18-12 s/p Partial colectomy   Past Surgical History  Procedure Laterality Date  . Scrotum exploration    . Removal of abcess  2006    pt. states abcess was located behind his tonsils  . Laparoscopic partial colectomy N/A 10/12/2012    Procedure: LAPAROSCOPIC PARTIAL COLECTOMY;  Surgeon: Adolph Pollack, MD;  Location: [facility];  Service: General;  Laterality: N/A;  . Colon surgery    . Abscess removed from throat    . Cataract extraction, bilateral    . Endobronchial ultrasound Bilateral 11/20/2012    Procedure: ENDOBRONCHIAL ULTRASOUND;  Surgeon: Leslye Peer, MD;  Location: [facility];  Service: Cardiopulmonary;  Laterality: Bilateral;   Social History:  reports that he quit smoking about 11 years ago. His smoking use included Cigarettes. He has a 90 pack-year smoking history. He quit smokeless tobacco use about 11 years ago. His smokeless tobacco use included Chew. He reports that he does not drink alcohol or use illicit drugs.   Allergies  Allergen Reactions  .  Sulfa Antibiotics Anaphylaxis  . Xyzal (Levocetirizine) Shortness Of Breath  . Pseudoephedrine     stops pt from urinating  . Amoxicillin Rash    Family History  Problem Relation Age of Onset  . Heart disease Father   . Heart disease Mother      Prior to Admission medications   Medication Sig Start Date End Date Taking? Authorizing Provider  albuterol (PROVENTIL HFA;VENTOLIN HFA) 108 (90  BASE) MCG/ACT inhaler Inhale 2 puffs into the lungs every 6 (six) hours as needed for wheezing.   Yes Historical Provider, MD  albuterol (PROVENTIL) (2.5 MG/3ML) 0.083% nebulizer solution Take 2.5 mg by nebulization every 6 (six) hours as needed for wheezing or shortness of breath.  08/04/12  Yes Historical Provider, MD  clobetasol ointment (TEMOVATE) 0.05 % Apply 1 application topically 2 (two) times daily. Apply to arms   Yes Historical Provider, MD  fluticasone (FLONASE) 50 MCG/ACT nasal spray Place 2 sprays into the nose daily.  08/28/12  Yes Historical Provider, MD  ketoconazole (NIZORAL) 2 % cream Apply 1 application topically daily. Apply to face   Yes Historical Provider, MD  lisinopril (PRINIVIL,ZESTRIL) 5 MG tablet Take 5 mg by mouth every morning.  09/02/12  Yes Historical Provider, MD  loratadine (CLARITIN) 10 MG tablet Take 10 mg by mouth daily as needed for allergies.    Yes Historical Provider, MD  metFORMIN (GLUCOPHAGE) 500 MG tablet Take 500 mg by mouth 2 (two) times daily with a meal.  09/11/12  Yes Historical Provider, MD  SPIRIVA HANDIHALER 18 MCG inhalation capsule Place 18 mcg into inhaler and inhale daily.  08/22/12  Yes Historical Provider, MD  SYMBICORT 160-4.5 MCG/ACT inhaler Inhale 2 puffs into the lungs 2 (two) times daily.  08/28/12  Yes Historical Provider, MD  tamsulosin (FLOMAX) 0.4 MG CAPS Take 0.4 mg by mouth daily.  08/22/12  Yes Historical Provider, MD   Physical Exam: Filed Vitals:   12/26/12 1926  BP: 120/57  Pulse: 116  Temp: 98.5 F (36.9 C)  Resp: 40  SpO2: 100%   BP 120/57  Pulse 116  Temp(Src) 98.5 F (36.9 C)  Resp 40  SpO2 100%  General Appearance:    Alert, cooperative, no distress, appears stated age  Head:    Normocephalic, without obvious abnormality, atraumatic           Throat:   Lips, mucosa, and tongue dry   Neck:   Supple, symmetrical, trachea midline, no adenopathy;       thyroid:  No enlargement/tenderness/nodules; no carotid    bruit or JVD  Back:     Symmetric, no curvature, ROM normal, no CVA tenderness  Lungs:     poor movement with wheezing on expiratory phase bilaterally   Chest wall:    No tenderness or deformity  Heart:    Regular rate and rhythm, S1 and S2 normal, no murmur, rub   or gallop  Abdomen:     Soft, non-tender, bowel sounds active all four quadrants,    no masses, no organomegaly        Extremities:   3+ edema left greater than right. No erythema or tender to touch.   Pulses:   2+ and symmetric all extremities  Skin:   Skin color, texture, turgor normal, no rashes or lesions  Lymph nodes:   Cervical, supraclavicular, and axillary nodes normal  Neurologic:   CNII-XII intact. Normal strength, sensation and reflexes      throughout    Labs on  Admission:  Basic Metabolic Panel:  Recent Labs Lab 12/26/12 1945  NA 133*  K 4.1  CL 95*  CO2 28  GLUCOSE 120*  BUN 8  CREATININE 0.44*  CALCIUM 8.9   Liver Function Tests:  Recent Labs Lab 12/26/12 1945  AST 22  ALT 10  ALKPHOS 70  BILITOT 0.3  PROT 7.1  ALBUMIN 2.3*   No results found for this basename: LIPASE, AMYLASE,  in the last 168 hours No results found for this basename: AMMONIA,  in the last 168 hours CBC:  Recent Labs Lab 12/26/12 1945  WBC 5.1  NEUTROABS 4.1  HGB 9.3*  HCT 28.4*  MCV 86.3  PLT 345   Cardiac Enzymes: No results found for this basename: CKTOTAL, CKMB, CKMBINDEX, TROPONINI,  in the last 168 hours  BNP (last 3 results)  Recent Labs  12/26/12 1945  PROBNP 215.4   CBG: No results found for this basename: GLUCAP,  in the last 168 hours  Radiological Exams on Admission: Dg Chest Portable 1 View  12/26/2012   *RADIOLOGY REPORT*  Clinical Data: Shortness of breath. History of metastatic disease.  PORTABLE CHEST - 1 VIEW  Comparison: PET CT 12/05/2012 and chest radiograph 11/24/2012  Findings: Single view of the chest was obtained.  There appears to be increased densities in the right hilum.   New densities at the right lung base may represent pleural fluid and atelectasis.  Heart size is stable.  Prominent lung markings, particularly in the right lower lung.  IMPRESSION: Increased densities in the right hilum and right lung base. Findings could represent progression of nodal and metastatic disease.  Blunting at the right costophrenic angle may represent pleural fluid.  Prominent lung markings in the right lower lung could represent an infectious or inflammatory process.   Original Report Authenticated By: Richarda Overlie, M.D.    EKG: Independently reviewed. Sinus tachycardia  Assessment/Plan  Acute-on-chronic respiratory failure: - Patient respiration has improved after several treatments of albuterol. I will go ahead and admit him to telemetry, etiology of his acute respiratory failure is most likely multifactorial he does have a COPD exacerbation as he has expiratory wheezing. I will go ahead and get a d-dimer, if d-dimer positive we'll get a lower extremity Doppler and a CT angiogram chest, he is low probability vitals criteria. 2-D echo as he also has lower extremity edema to rule out any cor pulmonale.  Acute exacerbation of chronic obstructive pulmonary disease: - Start him on IV steroids, antibiotics and inhaler - We'll continue him on high flow oxygen. And ambulate him and get a PTSD consult to  Lower extremity edema: - Check a d-dimer and 2-D echo. He's also hypoalbuminemic which could be contributing to his lower extremity edema.   DM (diabetes mellitus) type 2: - Go ahead and DC his metformin start him on millimeter 5 and sliding scale insulin.  HTN (hypertension): - Continue current home medications.  Lung cancer, main bronchus: - Follow up with oncology as an outpatient.  Moderate protein-calorie malnutrition: - This probably secondary to his malignancy, he relates ongoing weight loss and ensure 3 times a day.  Code Status: full Family Communication: wife and  daughter Disposition Plan: inpatient 2-3 days  Time spent: 35 minutes  Marinda Elk [facility] Pager [phone]  If 7PM-7AM, please contact night-coverage [email or web address] Password [email or web address] 12/26/2012, 11:01 PM

## 2012-12-26 NOTE — ED Notes (Signed)
Floor Unit RN still unavailable to take report on pt at this time.

## 2012-12-26 NOTE — ED Notes (Signed)
Floor Unit RN unavailable to take report on pt at this time. 

## 2012-12-26 NOTE — ED Notes (Signed)
Pt c/o increasing shortness of breath since Thurs; discharged 5/20 from hospital after being on ventilator after an outpatient bronchoscopy; increased swelling lower legs; resp distress noted--speaking in phrases; uses home oxygen at 2l/min turned up to 2.5l/min per [place] en route;

## 2012-12-26 NOTE — ED Provider Notes (Signed)
History     CSN: [id number]  Arrival date & time 12/26/12  1478   First MD Initiated Contact with Patient 12/26/12 1926      Chief Complaint  Patient presents with  . Shortness of Breath    (Consider location/radiation/quality/duration/timing/severity/associated sxs/prior treatment) Patient is a 79 y.o. male presenting with shortness of breath. The history is provided by the patient.  Shortness of Breath  patient here complaining of shortness of breath x6 days that became much worse today. Patient notes nonproductive cough without associated fever. Notes orthopnea and dyspnea exertion. Also notes bilateral lower pedal edema without history of CHF. Has used his oxygen at home with some relief. Does have a history of lung cancer as well as COPD. No vomiting or diarrhea. No anginal type chest pain. Symptoms have been progressively worse and no recent changes in his medications  Past Medical History  Diagnosis Date  . DM (diabetes mellitus) type 2 09/18/2012  . AAA (abdominal aortic aneurysm)   . COPD (chronic obstructive pulmonary disease)   . Bronchitis   . Hypertension     borderline   . Shortness of breath     with exertion   . Sleep apnea     sleeps with oxygen at 2.5 L/[place]   . Acute sinusitis     10/03/12   . History of kidney stones     many yrs ago  . Cancer     colon cancer s/p surgery 4'14, skin cancer  . Anemia requiring transfusions 11-16-12    last 10-18-12 s/p Partial colectomy    Past Surgical History  Procedure Laterality Date  . Scrotum exploration    . Removal of abcess  2006    pt. states abcess was located behind his tonsils  . Laparoscopic partial colectomy N/A 10/12/2012    Procedure: LAPAROSCOPIC PARTIAL COLECTOMY;  Surgeon: Adolph Pollack, MD;  Location: [facility];  Service: General;  Laterality: N/A;  . Colon surgery    . Abscess removed from throat    . Cataract extraction, bilateral    . Endobronchial ultrasound Bilateral 11/20/2012    Procedure:  ENDOBRONCHIAL ULTRASOUND;  Surgeon: Leslye Peer, MD;  Location: [facility];  Service: Cardiopulmonary;  Laterality: Bilateral;    Family History  Problem Relation Age of Onset  . Heart disease Father   . Heart disease Mother     History  Substance Use Topics  . Smoking status: Former Smoker -- 1.50 packs/day for 60 years    Types: Cigarettes    Quit date: 07/12/2001  . Smokeless tobacco: Former Neurosurgeon    Types: Chew    Quit date: 11/16/2001  . Alcohol Use: No      Review of Systems  Respiratory: Positive for shortness of breath.   All other systems reviewed and are negative.    Allergies  Sulfa antibiotics; Xyzal; Pseudoephedrine; and Amoxicillin  Home Medications   Current Outpatient Rx  Name  Route  Sig  Dispense  Refill  . albuterol (PROVENTIL HFA;VENTOLIN HFA) 108 (90 BASE) MCG/ACT inhaler   Inhalation   Inhale 2 puffs into the lungs every 6 (six) hours as needed for wheezing.         Marland Kitchen albuterol (PROVENTIL) (2.5 MG/3ML) 0.083% nebulizer solution   Nebulization   Take 2.5 mg by nebulization every 6 (six) hours as needed for wheezing or shortness of breath.          . clobetasol ointment (TEMOVATE) 0.05 %   Topical  Apply 1 application topically 2 (two) times daily. Apply to arms         . fluticasone (FLONASE) 50 MCG/ACT nasal spray   Nasal   Place 2 sprays into the nose daily.          Marland Kitchen ketoconazole (NIZORAL) 2 % cream   Topical   Apply 1 application topically daily. Apply to face         . lisinopril (PRINIVIL,ZESTRIL) 5 MG tablet   Oral   Take 5 mg by mouth every morning.          . loratadine (CLARITIN) 10 MG tablet   Oral   Take 10 mg by mouth daily as needed for allergies.          . metFORMIN (GLUCOPHAGE) 500 MG tablet   Oral   Take 500 mg by mouth 2 (two) times daily with a meal.          . SPIRIVA HANDIHALER 18 MCG inhalation capsule   Inhalation   Place 18 mcg into inhaler and inhale daily.          .  SYMBICORT 160-4.5 MCG/ACT inhaler   Inhalation   Inhale 2 puffs into the lungs 2 (two) times daily.          . tamsulosin (FLOMAX) 0.4 MG CAPS   Oral   Take 0.4 mg by mouth daily.            BP 120/57  Pulse 116  Temp(Src) 98.5 F (36.9 C)  Resp 40  SpO2 100%  Physical Exam  Nursing note and vitals reviewed. Constitutional: He is oriented to person, place, and time. He appears well-developed and well-nourished.  Non-toxic appearance. No distress.  HENT:  Head: Normocephalic and atraumatic.  Eyes: Conjunctivae, EOM and lids are normal. Pupils are equal, round, and reactive to light.  Neck: Normal range of motion. Neck supple. No tracheal deviation present. No mass present.  Cardiovascular: Regular rhythm and normal heart sounds.  Tachycardia present.  Exam reveals no gallop.   No murmur heard. Pulmonary/Chest: Effort normal. No stridor. No respiratory distress. He has decreased breath sounds. He has wheezes. He has no rhonchi. He has no rales.  Abdominal: Soft. Normal appearance and bowel sounds are normal. He exhibits no distension. There is no tenderness. There is no rebound and no CVA tenderness.  Musculoskeletal: Normal range of motion. He exhibits no edema and no tenderness.  3 plus bilateral pedal edema  Neurological: He is alert and oriented to person, place, and time. He has normal strength. No cranial nerve deficit or sensory deficit. GCS eye subscore is 4. GCS verbal subscore is 5. GCS motor subscore is 6.  Skin: Skin is warm and dry. No abrasion and no rash noted.  Psychiatric: He has a normal mood and affect. His speech is normal and behavior is normal.    ED Course  Procedures (including critical care time)  Labs Reviewed  CBC WITH DIFFERENTIAL  COMPREHENSIVE METABOLIC PANEL  PRO B NATRIURETIC PEPTIDE   No results found.   No diagnosis found.    MDM   Date: 12/26/2012  Rate: 115  Rhythm: sinus tachycardia  QRS Axis: normal  Intervals: normal   ST/T Wave abnormalities: nonspecific ST changes  Conduction Disutrbances:none  Narrative Interpretation:   Old EKG Reviewed: none available  10:30 PM Patient given albuterol treatment and breathing has improved. X-ray with possibly pneumonia. Patient reassessed multiple times her breathing has improved. No urgent need for intubation at  this time. We'll check blood gas and patient will be admitted to step down via [facility]  CRITICAL CARE Performed by: Toy Baker Total critical care time: 45 Critical care time was exclusive of separately billable procedures and treating other patients. Critical care was necessary to treat or prevent imminent or life-threatening deterioration. Critical care was time spent personally by me on the following activities: development of treatment plan with patient and/or surrogate as well as nursing, discussions with consultants, evaluation of patient's response to treatment, examination of patient, obtaining history from patient or surrogate, ordering and performing treatments and interventions, ordering and review of laboratory studies, ordering and review of radiographic studies, pulse oximetry and re-evaluation of patient's condition.         Toy Baker, MD 12/26/12 2231

## 2012-12-27 ENCOUNTER — Encounter (HOSPITAL_COMMUNITY): Payer: Self-pay | Admitting: Radiology

## 2012-12-27 ENCOUNTER — Ambulatory Visit: Payer: Medicare Other

## 2012-12-27 ENCOUNTER — Ambulatory Visit (HOSPITAL_COMMUNITY): Payer: Medicare Other

## 2012-12-27 DIAGNOSIS — J189 Pneumonia, unspecified organism: Secondary | ICD-10-CM | POA: Diagnosis present

## 2012-12-27 DIAGNOSIS — C189 Malignant neoplasm of colon, unspecified: Secondary | ICD-10-CM

## 2012-12-27 DIAGNOSIS — I517 Cardiomegaly: Secondary | ICD-10-CM

## 2012-12-27 DIAGNOSIS — J9600 Acute respiratory failure, unspecified whether with hypoxia or hypercapnia: Secondary | ICD-10-CM

## 2012-12-27 LAB — GLUCOSE, CAPILLARY
Glucose-Capillary: 147 mg/dL — ABNORMAL HIGH (ref 70–99)
Glucose-Capillary: 237 mg/dL — ABNORMAL HIGH (ref 70–99)
Glucose-Capillary: 194 mg/dL — ABNORMAL HIGH (ref 70–99)
Glucose-Capillary: 262 mg/dL — ABNORMAL HIGH (ref 70–99)
Glucose-Capillary: 242 mg/dL — ABNORMAL HIGH (ref 70–99)

## 2012-12-27 LAB — COMPREHENSIVE METABOLIC PANEL
ALT: 8 U/L (ref 0–53)
Albumin: 2.3 g/dL — ABNORMAL LOW (ref 3.5–5.2)
Alkaline Phosphatase: 67 U/L (ref 39–117)
BUN: 8 mg/dL (ref 6–23)
Chloride: 95 mEq/L — ABNORMAL LOW (ref 96–112)
GFR calc Af Amer: >90 mL/min (ref >90)
Glucose, Bld: 144 mg/dL — ABNORMAL HIGH (ref 70–99)
Potassium: 3.7 mEq/L (ref 3.5–5.1)
Sodium: 133 mEq/L — ABNORMAL LOW (ref 135–145)
Total Bilirubin: 0.4 mg/dL (ref 0.3–1.2)

## 2012-12-27 LAB — HEMOGLOBIN A1C: Hgb A1c MFr Bld: 6.6 % — ABNORMAL HIGH (ref <5.7)

## 2012-12-27 LAB — CBC
HCT: 26.7 % — ABNORMAL LOW (ref 39.0–52.0)
Hemoglobin: 8.7 g/dL — ABNORMAL LOW (ref 13.0–17.0)
WBC: 5.1 10*3/uL (ref 4.0–10.5)

## 2012-12-27 MED ORDER — TIOTROPIUM BROMIDE MONOHYDRATE 18 MCG IN CAPS
18.0000 ug | ORAL_CAPSULE | Freq: Every day | RESPIRATORY_TRACT | Status: DC
  Administered 2012-12-27 – 2012-12-28 (×2): 18 ug via RESPIRATORY_TRACT
  Filled 2012-12-27: qty 5

## 2012-12-27 MED ORDER — IOHEXOL 350 MG/ML SOLN
100.0000 mL | Freq: Once | INTRAVENOUS | Status: AC | PRN
  Administered 2012-12-27: 100 mL via INTRAVENOUS

## 2012-12-27 MED ORDER — ONDANSETRON HCL 4 MG/2ML IJ SOLN: 4.0000 mg | Freq: Four times a day (QID) | INTRAMUSCULAR | Status: DC | PRN

## 2012-12-27 MED ORDER — ENSURE PUDDING PO PUDG
1.0000 | Freq: Three times a day (TID) | ORAL | Status: DC
  Administered 2012-12-27 (×2): 1 via ORAL
  Filled 2012-12-27 (×3): qty 1

## 2012-12-27 MED ORDER — METHYLPREDNISOLONE SODIUM SUCC 40 MG IJ SOLR
40.0000 mg | Freq: Two times a day (BID) | INTRAMUSCULAR | Status: DC
  Administered 2012-12-27 – 2012-12-28 (×3): 40 mg via INTRAVENOUS
  Filled 2012-12-27 (×4): qty 1

## 2012-12-27 MED ORDER — GLUCERNA SHAKE PO LIQD
237.0000 mL | ORAL | Status: DC
  Administered 2012-12-27: 237 mL via ORAL
  Filled 2012-12-27 (×2): qty 237

## 2012-12-27 MED ORDER — INSULIN ASPART 100 UNIT/ML ~~LOC~~ SOLN
0.0000 [IU] | Freq: Three times a day (TID) | SUBCUTANEOUS | Status: DC
  Administered 2012-12-27: 8 [IU] via SUBCUTANEOUS
  Administered 2012-12-27: 3 [IU] via SUBCUTANEOUS
  Administered 2012-12-27: 5 [IU] via SUBCUTANEOUS
  Administered 2012-12-28 (×2): 3 [IU] via SUBCUTANEOUS

## 2012-12-27 MED ORDER — ALBUTEROL SULFATE HFA 108 (90 BASE) MCG/ACT IN AERS
2.0000 | INHALATION_SPRAY | Freq: Four times a day (QID) | RESPIRATORY_TRACT | Status: DC | PRN
  Administered 2012-12-27: 2 via RESPIRATORY_TRACT
  Filled 2012-12-27: qty 6.7

## 2012-12-27 MED ORDER — LEVOFLOXACIN IN D5W 500 MG/100ML IV SOLN: 500.0000 mg | INTRAVENOUS | Status: DC

## 2012-12-27 MED ORDER — BUDESONIDE-FORMOTEROL FUMARATE 160-4.5 MCG/ACT IN AERO
2.0000 | INHALATION_SPRAY | Freq: Two times a day (BID) | RESPIRATORY_TRACT | Status: DC
  Administered 2012-12-27 – 2012-12-28 (×3): 2 via RESPIRATORY_TRACT
  Filled 2012-12-27: qty 6

## 2012-12-27 MED ORDER — LORATADINE 10 MG PO TABS
10.0000 mg | ORAL_TABLET | Freq: Every day | ORAL | Status: DC | PRN
  Filled 2012-12-27: qty 1

## 2012-12-27 MED ORDER — TAMSULOSIN HCL 0.4 MG PO CAPS
0.4000 mg | ORAL_CAPSULE | Freq: Every day | ORAL | Status: DC
  Administered 2012-12-27 – 2012-12-28 (×2): 0.4 mg via ORAL
  Filled 2012-12-27 (×2): qty 1

## 2012-12-27 MED ORDER — LISINOPRIL 5 MG PO TABS
5.0000 mg | ORAL_TABLET | Freq: Every morning | ORAL | Status: DC
  Administered 2012-12-27 – 2012-12-28 (×2): 5 mg via ORAL
  Filled 2012-12-27 (×2): qty 1

## 2012-12-27 MED ORDER — INSULIN ASPART 100 UNIT/ML ~~LOC~~ SOLN
0.0000 [IU] | Freq: Every day | SUBCUTANEOUS | Status: DC
  Administered 2012-12-27: 2 [IU] via SUBCUTANEOUS

## 2012-12-27 MED ORDER — POLYETHYLENE GLYCOL 3350 17 G PO PACK
17.0000 g | PACK | Freq: Every day | ORAL | Status: DC | PRN
  Filled 2012-12-27: qty 1

## 2012-12-27 MED ORDER — INSULIN ASPART 100 UNIT/ML ~~LOC~~ SOLN
3.0000 [IU] | Freq: Three times a day (TID) | SUBCUTANEOUS | Status: DC
  Administered 2012-12-27 – 2012-12-28 (×5): 3 [IU] via SUBCUTANEOUS

## 2012-12-27 MED ORDER — ENSURE PUDDING PO PUDG
1.0000 | Freq: Two times a day (BID) | ORAL | Status: DC
  Administered 2012-12-28: 1 via ORAL
  Filled 2012-12-27 (×2): qty 1

## 2012-12-27 MED ORDER — SODIUM CHLORIDE 0.9 % IV SOLN: INTRAVENOUS | Status: DC

## 2012-12-27 MED ORDER — SODIUM CHLORIDE 0.9 % IV SOLN
INTRAVENOUS | Status: AC
  Administered 2012-12-27: 06:00:00 via INTRAVENOUS

## 2012-12-27 MED ORDER — HEPARIN SODIUM (PORCINE) 5000 UNIT/ML IJ SOLN
5000.0000 [IU] | Freq: Three times a day (TID) | INTRAMUSCULAR | Status: DC
  Administered 2012-12-27 – 2012-12-28 (×5): 5000 [IU] via SUBCUTANEOUS
  Filled 2012-12-27 (×8): qty 1

## 2012-12-27 MED ORDER — ONDANSETRON HCL 4 MG PO TABS: 4.0000 mg | ORAL_TABLET | Freq: Four times a day (QID) | ORAL | Status: DC | PRN

## 2012-12-27 MED ORDER — INSULIN DETEMIR 100 UNIT/ML ~~LOC~~ SOLN
5.0000 [IU] | Freq: Every day | SUBCUTANEOUS | Status: DC
  Administered 2012-12-27 (×2): 5 [IU] via SUBCUTANEOUS
  Filled 2012-12-27 (×3): qty 0.05

## 2012-12-27 NOTE — Evaluation (Addendum)
Physical Therapy Evaluation Patient Details Name: Alan [name] MRN: [id number] DOB: 1931-10-22 Today's Date: 12/27/2012 Time: 6644-0347 PT Time Calculation (min): 36 min (saw 1009-1030 for eval and TA, then 1152-1207 for ambulation/stair training)  PT Assessment / Plan / Recommendation Clinical Impression  Pt presents with acute on chronic respiratory failure with history of COPD, nonsmall cell lung cancer/colong cancer and AAA repair.  Tolerated OOB and ambulation in hallway well at mostly supervision level, however pt does have some issues with energy conservation and needs cues for slower gait speed.  Ambulated on 3LO2 with SaO2 at 90% following ambulation and then 88% following stair training.  RN made aware.  Pt will benefit from skilled PT in acute care to address deficits.  PT recommends outpatient PT at D/C to assist with energy conservation techniques and supervised O2 monitoring.      PT Assessment  Patient needs continued PT services    Follow Up Recommendations  Outpatient PT    Does the patient have the potential to tolerate intense rehabilitation      Barriers to Discharge None      Equipment Recommendations  None recommended by PT    Recommendations for Other Services     Frequency Min 3X/week    Precautions / Restrictions Precautions Precautions: Fall Precaution Comments: monitor sats Restrictions Weight Bearing Restrictions: No   Pertinent Vitals/Pain No pain      Mobility  Bed Mobility Bed Mobility: Not assessed Transfers Transfers: Sit to Stand;Stand to Sit Sit to Stand: 4: Min guard;From chair/3-in-1;With upper extremity assist Stand to Sit: 4: Min guard;With upper extremity assist;To chair/3-in-1 Details for Transfer Assistance: Min/guard for safety.  Ambulation/Gait Ambulation/Gait Assistance: 4: Min guard;5: Supervision Ambulation Distance (Feet): 275 Feet Assistive device: None Ambulation/Gait Assistance Details: Min/guard initially for  safety then supervision with cues for continued pursed lip breathing and rest breaks as needed.   Gait Pattern: Step-through pattern;Decreased stride length Stairs: Yes Stairs Assistance: 5: Supervision Stairs Assistance Details (indicate cue type and reason): cues for taking stairs slower for energy conservation Stair Management Technique: One rail Left Number of Stairs: 10 Wheelchair Mobility Wheelchair Mobility: No    Exercises     PT Diagnosis: Difficulty walking  PT Problem List: Decreased activity tolerance;Decreased balance;Decreased mobility PT Treatment Interventions: DME instruction;Gait training;Functional mobility training;Therapeutic activities;Therapeutic exercise;Balance training;Patient/family education   PT Goals Acute Rehab PT Goals PT Goal Formulation: With patient/family Time For Goal Achievement: 01/03/13 Potential to Achieve Goals: Good Pt will go Supine/Side to Sit: with modified independence PT Goal: Supine/Side to Sit - Progress: Goal set today Pt will go Sit to Supine/Side: with modified independence PT Goal: Sit to Supine/Side - Progress: Goal set today Pt will go Sit to Stand: with modified independence PT Goal: Sit to Stand - Progress: Goal set today Pt will go Stand to Sit: with modified independence PT Goal: Stand to Sit - Progress: Goal set today Pt will Ambulate: >150 feet;with modified independence PT Goal: Ambulate - Progress: Goal set today Pt will Perform Home Exercise Program: with supervision, verbal cues required/provided PT Goal: Perform Home Exercise Program - Progress: Goal set today  Visit Information  Last PT Received On: 12/27/12 Assistance Needed: +1    Subjective Data  Subjective: I'll go walking when I get my Symbicort.  Patient Stated Goal: to return home.    Prior Functioning  Home Living Lives With: Spouse Available Help at Discharge: Family;Available 24 hours/day Type of Home: House Home Access: Stairs to  enter [employer]  of Steps: 4 Entrance Stairs-Rails: Left Home Layout: One level Bathroom Shower/Tub: Engineer, manufacturing systems: Standard Home Adaptive Equipment: None Prior Function Able to Take Stairs?: Yes Vocation: Retired Musician: No difficulties    Copywriter, advertising Arousal/Alertness: Awake/alert Behavior During Therapy: WFL for tasks assessed/performed Overall Cognitive Status: Within Functional Limits for tasks assessed    Extremity/Trunk Assessment Right Upper Extremity Assessment RUE ROM/Strength/Tone: [facility] for tasks assessed Left Upper Extremity Assessment LUE ROM/Strength/Tone: WFL for tasks assessed Right Lower Extremity Assessment RLE ROM/Strength/Tone: WFL for tasks assessed RLE Sensation: WFL - Light Touch Left Lower Extremity Assessment LLE ROM/Strength/Tone: WFL for tasks assessed LLE Sensation: WFL - Light Touch Trunk Assessment Trunk Assessment: Normal   Balance Balance Balance Assessed: Yes Dynamic Standing Balance Dynamic Standing - Level of Assistance: 4: Min assist (min guard assist to pull up underwear)  End of Session PT - End of Session Equipment Utilized During Treatment: Oxygen Activity Tolerance: Patient limited by fatigue Patient left: in chair;with call bell/phone within reach;with family/visitor present Nurse Communication: Mobility status  GP     Vista Deck 12/27/2012, 1:00 PM

## 2012-12-27 NOTE — Progress Notes (Signed)
  Echocardiogram 2D Echocardiogram has been performed.  Jorje Guild 12/27/2012, 8:27 AM

## 2012-12-27 NOTE — Evaluation (Signed)
Occupational Therapy Evaluation Patient Details Name: Alan [name] MRN: [id number] DOB: 1931/11/16 Today's Date: 12/27/2012 Time: 1009-1030 OT Time Calculation (min): 21 min  OT Assessment / Plan / Recommendation Clinical Impression  Pt admitted with SOB and currently displays some decreased activity tolerance and independence with ADL. Will benfit from skilled OT servies to further educate on energy conservation and improve ADL independence.     OT Assessment  Patient needs continued OT Services    Follow Up Recommendations  No OT follow up;Supervision/Assistance - 24 hour (likely no OT follow)    Barriers to Discharge      Equipment Recommendations  3 in 1 bedside comode (will further assess. may benefit; tubseat if pt agreeable)    Recommendations for Other Services    Frequency  Min 2X/week    Precautions / Restrictions Precautions Precaution Comments: monitor sats Restrictions Weight Bearing Restrictions: No        ADL  Eating/Feeding: Simulated;Independent Where Assessed - Eating/Feeding: Chair Grooming: Simulated;Wash/dry hands;Set up Where Assessed - Grooming: Supported sitting Upper Body Bathing: Performed;Chest;Right arm;Left arm;Abdomen;Set up;Supervision/safety Where Assessed - Upper Body Bathing: Supported sitting Lower Body Bathing: Simulated;Min guard Where Assessed - Lower Body Bathing: Supported sit to stand Upper Body Dressing: Simulated;Minimal assistance (only for lines/IV) Where Assessed - Upper Body Dressing: Supported sitting Lower Body Dressing: Simulated;Min guard Where Assessed - Lower Body Dressing: Supported sit to Pharmacist, hospital: Performed;Min Pension scheme manager Method: Stand pivot (BSC to chair) Acupuncturist: Bedside commode Toileting - Clothing Manipulation and Hygiene: Performed;Min guard Where Assessed - Engineer, mining and Hygiene: Sit to stand from 3-in-1 or toilet ADL Comments: Wife  present for session. Pt wanting breathing treatment before he walks so agreeable only to go from [facility] to chair for this session. Nursing aware of pt request. Pt becoming SOB with bath on BSC so encouraged several rest breaks and PLB. Pt may benefit from a tubseat for energy conservation. Will further discuss with pt as able.     OT Diagnosis: Generalized weakness  OT Problem List: Decreased strength;Decreased activity tolerance;Decreased knowledge of use of DME or AE OT Treatment Interventions: Self-care/ADL training;Therapeutic activities;Patient/family education;DME and/or AE instruction;Energy conservation   OT Goals Acute Rehab OT Goals OT Goal Formulation: With patient/family Time For Goal Achievement: 01/10/13 Potential to Achieve Goals: Good ADL Goals Pt Will Perform Grooming: with supervision;Standing at sink (2 tasks) ADL Goal: Grooming - Progress: Goal set today Pt Will Transfer to Toilet: with supervision;Ambulation;3-in-1 ADL Goal: Toilet Transfer - Progress: Goal set today Pt Will Perform Toileting - Clothing Manipulation: with supervision;Standing ADL Goal: Toileting - Clothing Manipulation - Progress: Goal set today Pt Will Perform Tub/Shower Transfer: Tub transfer;with supervision;with DME ADL Goal: Tub/Shower Transfer - Progress: Goal set today Additional ADL Goal #1: Pt will verbalize 3 energy conservation techniques independently for ADL. ADL Goal: Additional Goal #1 - Progress: Goal set today Additional ADL Goal #2: Pt will gather all items for B/D and perform at supervision level. ADL Goal: Additional Goal #2 - Progress: Goal set today  Visit Information  Last OT Received On: 12/27/12 Assistance Needed: +1 PT/OT Co-Evaluation/Treatment: Yes    Subjective Data  Subjective: I need my breathing treatment Patient Stated Goal: none stated. agreeable to wash up while on commode seat   Prior Functioning     Home Living Lives With: Spouse Available Help at  Discharge: Family;Available 24 hours/day Type of Home: House Home Access: Stairs to enter [employer] of Steps: 4 Entrance Stairs-Rails: Left  Home Layout: One level Bathroom Shower/Tub: Network engineer: None Prior Function Able to Take Stairs?: Yes Vocation: Retired Musician: No difficulties         Vision/Perception     Copywriter, advertising Arousal/Alertness: Awake/alert Behavior During Therapy: WFL for tasks assessed/performed Overall Cognitive Status: Within Functional Limits for tasks assessed    Extremity/Trunk Assessment Right Upper Extremity Assessment RUE ROM/Strength/Tone: [facility] for tasks assessed Left Upper Extremity Assessment LUE ROM/Strength/Tone: WFL for tasks assessed     Mobility Transfers Transfers: Sit to Stand;Stand to Sit Sit to Stand: 4: Min guard;From chair/3-in-1;With upper extremity assist Stand to Sit: 4: Min guard;With upper extremity assist;To chair/3-in-1     Exercise     Balance Balance Balance Assessed: Yes Dynamic Standing Balance Dynamic Standing - Level of Assistance: 4: Min assist (min guard assist to pull up underwear)   End of Session OT - End of Session Activity Tolerance: Other (comment) (some shortness of breath. awaiting breathing treatment) Patient left: in chair;with call bell/phone within reach;with family/visitor present  GO     Lennox Laity [phone] 12/27/2012, 12:07 PM

## 2012-12-27 NOTE — Progress Notes (Addendum)
[facility] PROGRESS NOTE  Alan [name] YNW:[id number] DOB: [birth year] DOA: 12/26/2012 PCP: Redmond Baseman, MD  Brief narrative: 79 y.o. male with multiple medical comorbidities including but not limited to COPD on home oxygen, non-small cell lung cancer of the left bronchus status post palliative radiation to the chest, stage I colon cancer in April 2014, abdominal aortic aneurysm status post percutaneous graft, diabetes who presented to [facility] ED 12/26/2012 with worsening shortness of breath for past 2 weeks prior to this admission. Patient reports that supplemental oxygen did not provide any symptomatic relief. In addition, patient reported nonproductive cough but no associated fevers. In ED, blood pressure 85/47 which has improved to 123/55 with IV fluids given in ED. Patient remains afebrile with Tmax of 98.5 F and oxygen saturation of 97% on 2 L nasal cannula. CT chest angio was negative for pulmonary embolism. Chest x-ray was significant for increased densities in the right hilum and right lung base as well as likely progression of metastatic disease. CBC was significant for hemoglobin of 9.3. BMP revealed hyponatremia of 133.  Assessment and plan:  Principal Problem:   Acute-on-chronic respiratory failure with hypoxia - Likely non-small lung cancer as well as COPD as well as postobstructive pneumonia all contributory - Respiratory status is currently stable - Continue Levaquin for possible pneumonia. - Solu-Medrol 40 mg IV twice a day - Continue oxygen support as needed with nasal cannula to keep oxygen saturation above 90% - Albuterol inhaler every 6 hours as needed; continue Spiriva Active Problems:   DM (diabetes mellitus) type 2 - Appreciate diabetic coordinator consultation - Continue NovoLog 3 units with meals and Levemir 5 units at bedtime - Continue sliding scale insulin   HTN (hypertension) - Continue lisinopril 5 mg daily   Acute exacerbation of chronic  obstructive pulmonary disease - Management as above with Levaquin, steroids and Spiriva - Albuterol inhaler as needed   Lung cancer, main bronchus - Management per oncology - Status post palliative radiation to the chest   Moderate protein-calorie malnutrition - Appreciate nutrition consultation  Consultants:  None   Procedures/Studies: Ct Angio Chest Pe W/cm &/or Wo Cm 12/27/2012     IMPRESSION: No evidence of acute pulmonary thromboembolism.  There are confluent pulmonary parenchymal opacities associated with interstitial lung disease primarily in the right lung.  These findings may reflect radiation pneumonitis however metastatic disease is not excluded.  Small nodules are subjectively larger.  Small right pleural effusion.  This can be associated with radiation pneumonitis.    Dg Chest Portable 1 View 12/26/2012    IMPRESSION: Increased densities in the right hilum and right lung base. Findings could represent progression of nodal and metastatic disease.  Blunting at the right costophrenic angle may represent pleural fluid.  Prominent lung markings in the right lower lung could represent an infectious or inflammatory process.     Antibiotics:  Levaquin 12/26/2012 -->  Code Status: Full Family Communication: Pt at bedside Disposition Plan: Home when medically stable  HPI/Subjective: No events overnight.   Objective: Filed Vitals:   12/27/12 0723 12/27/12 1025 12/27/12 1039 12/27/12 1436  BP: 108/51   111/49  Pulse:    104  Temp:    98.5 F (36.9 C)  TempSrc:    Oral  Resp:    20  Height:      Weight:      SpO2: 98% 92% 99% 96%    Intake/Output Summary (Last 24 hours) at 12/27/12 1733 Last data filed at 12/27/12 1437  Gross  per 24 hour  Intake    600 ml  Output    500 ml  Net    100 ml    Exam:   General:  Pt is not in acute distress  Cardiovascular: Regular rate and rhythm, S1/S2 appreciated  Respiratory: Some wheezing and upper lung lobes, no  crackles  Abdomen: Soft, non tender, non distended, bowel sounds present, no guarding  Extremities: (+3) LE edema, pulses DP and PT palpable bilaterally  Neuro: Grossly nonfocal  Data Reviewed: Basic Metabolic Panel:  Recent Labs Lab 12/26/12 1945 12/27/12 0100  NA 133* 133*  K 4.1 3.7  CL 95* 95*  CO2 28 30  GLUCOSE 120* 144*  BUN 8 8  CREATININE 0.44* 0.45*  CALCIUM 8.9 9.0   Liver Function Tests:  Recent Labs Lab 12/26/12 1945 12/27/12 0100  AST 22 11  ALT 10 8  ALKPHOS 70 67  BILITOT 0.3 0.4  PROT 7.1 6.9  ALBUMIN 2.3* 2.3*   No results found for this basename: LIPASE, AMYLASE,  in the last 168 hours No results found for this basename: AMMONIA,  in the last 168 hours CBC:  Recent Labs Lab 12/26/12 1945 12/27/12 0100  WBC 5.1 5.1  NEUTROABS 4.1  --   HGB 9.3* 8.7*  HCT 28.4* 26.7*  MCV 86.3 87.0  PLT 345 338   Cardiac Enzymes: No results found for this basename: CKTOTAL, CKMB, CKMBINDEX, TROPONINI,  in the last 168 hours BNP: No components found with this basename: POCBNP,  CBG:  Recent Labs Lab 12/27/12 0106 12/27/12 0743 12/27/12 1138 12/27/12 1704  GLUCAP 147* 237* 194* 262*    No results found for this or any previous visit (from the past 240 hour(s)).   Scheduled Meds: . budesonide-formoterol  2 puff Inhalation BID  . heparin  5,000 Units Subcutaneous Q8H  . insulin aspart  0-15 Units Subcutaneous TID WC  . insulin aspart  0-5 Units Subcutaneous QHS  . insulin aspart  3 Units Subcutaneous TID WC  . insulin detemir  5 Units Subcutaneous QHS  . levofloxacin  750 mg Intravenous Q24H  . lisinopril  5 mg Oral q morning - 10a  . methylPREDNISolone   40 mg Intravenous BID  . tamsulosin  0.4 mg Oral Daily  . tiotropium  18 mcg Inhalation Daily    Debbora Presto, MD  [facility] Pager [phone]  If 7PM-7AM, please contact night-coverage [email or web address] Password [facility] 12/27/2012, 5:33 PM   LOS: 1 day

## 2012-12-27 NOTE — Progress Notes (Signed)
Inpatient Diabetes Program Recommendations  AACE/ADA: New Consensus Statement on Inpatient Glycemic Control (2013)  Target Ranges:  Prepandial:   less than 140 mg/dL      Peak postprandial:   less than 180 mg/dL (1-2 hours)      Critically ill patients:  140 - 180 mg/dL     Patient admitted with SOB.  Has history of DM.  Takes Metformin 500 mg bid at home.  A1c 6.6% (12/27/12).  **Noted Levemir and Novolog started today  MD- Please change diet to Carbohydrate Modified diet (patient currently on Regular diet with no carbohydrate restrictions)  Will follow. Ambrose Finland RN, MSN, CDE Diabetes Coordinator Inpatient Diabetes Program [phone]

## 2012-12-27 NOTE — Progress Notes (Signed)
INITIAL NUTRITION ASSESSMENT  DOCUMENTATION CODES Per approved criteria  -Not Applicable   INTERVENTION: Continue Ensure Pudding BID Provide Glucerna once daily Provided "High Calorie, High Protein Recipes" handout, "25 Healthy snacks" handout, and "Tips for increasing Protein and Calories" handout from the Academy of Nutrition and Dietetics Encouraged PO intake  NUTRITION DIAGNOSIS: Inadequate oral intake related to decreased appetite as evidenced by 8% wt loss in less than 2 months.   Goal: Pt to meet >/= 90% of their estimated nutrition needs  Monitor:  PO intake Weight Labs  Reason for Assessment: Malnutrition screening Tool, score of 3  79 y.o. male  Admitting Dx: Acute-on-chronic respiratory failure  ASSESSMENT: 79 y.o. male with past medical history of COPD O2 dependent, requiring intubation after bronchoscopy on 11/20/2012, non-small cell lung cancer obstructing the left bronchus, history of stage I colon cancer in April 2014, abdominal aortic aneurysm status post percutaneous graft on 5/23 2014, diabetes that comes in for shortness of breath that started 2 weeks prior to admission progressively getting worse to the point where he can't even go to the bathroom without getting short of breath.  Spoke with pt and pt's wife present in room. Pt reports that his appetite has been poor for the past week and her has been eating very little. Pt was trying to drink Glucerna 1-2 times daily PTA but, it was possibly causing constipation. Pt reports that he has been eating better since admission and has had two bowel movements today. Encouraged protein-rich foods and provided list of high-protein foods. Encouraged pt to eat more frequently due to eating small amounts. Gave pt and wife handouts with protein-rich snacks and recipes; both very appreciative and stated that they think this will help pt improve PO intake and help pt maintain weight.   Height: Ht Readings from Last 1  Encounters:  12/27/12 5\' 5"  (1.651 m)    Weight: Wt Readings from Last 1 Encounters:  12/27/12 150 lb 2.1 oz (68.1 kg)    Ideal Body Weight: 136 lbs  % Ideal Body Weight: 110%  Wt Readings from Last 10 Encounters:  12/27/12 150 lb 2.1 oz (68.1 kg)  12/18/12 153 lb 11.2 oz (69.718 kg)  12/13/12 154 lb (69.854 kg)  12/01/12 152 lb 14.4 oz (69.355 kg)  11/24/12 154 lb 12.2 oz (70.2 kg)  11/24/12 154 lb 12.2 oz (70.2 kg)  11/15/12 162 lb 9.6 oz (73.755 kg)  11/10/12 164 lb 3.2 oz (74.481 kg)  11/07/12 164 lb 6.4 oz (74.571 kg)  11/01/12 163 lb 12.8 oz (74.299 kg)    Usual Body Weight: 160 lbs  % Usual Body Weight: 94%  BMI:  Body mass index is 24.98 kg/(m^2).  Estimated Nutritional Needs: Kcal: 1545-1800 Protein: 102-115 grams Fluid: 2 L  Skin: intact; +2 RLE edema, +3 LLE edema  Diet Order: General  EDUCATION NEEDS: -Education needs addressed   Intake/Output Summary (Last 24 hours) at 12/27/12 1702 Last data filed at 12/27/12 1437  Gross per 24 hour  Intake    600 ml  Output    500 ml  Net    100 ml    Last BM: 6/18 per pt report  Labs:   Recent Labs Lab 12/26/12 1945 12/27/12 0100  NA 133* 133*  K 4.1 3.7  CL 95* 95*  CO2 28 30  BUN 8 8  CREATININE 0.44* 0.45*  CALCIUM 8.9 9.0  GLUCOSE 120* 144*   Lab Results  Component Value Date   HGBA1C 6.6* 12/27/2012  CBG (last 3)   Recent Labs  12/27/12 0106 12/27/12 0743 12/27/12 1138  GLUCAP 147* 237* 194*    Scheduled Meds: . sodium chloride   Intravenous STAT  . budesonide-formoterol  2 puff Inhalation BID  . feeding supplement  1 Container Oral TID BM  . heparin  5,000 Units Subcutaneous Q8H  . insulin aspart  0-15 Units Subcutaneous TID WC  . insulin aspart  0-5 Units Subcutaneous QHS  . insulin aspart  3 Units Subcutaneous TID WC  . insulin detemir  5 Units Subcutaneous QHS  . levofloxacin  750 mg Intravenous Q24H  . lisinopril  5 mg Oral q morning - 10a  . methylPREDNISolone  (SOLU-MEDROL) injection  40 mg Intravenous BID  . tamsulosin  0.4 mg Oral Daily  . tiotropium  18 mcg Inhalation Daily    Continuous Infusions:   Past Medical History  Diagnosis Date  . DM (diabetes mellitus) type 2 09/18/2012  . AAA (abdominal aortic aneurysm)   . COPD (chronic obstructive pulmonary disease)   . Bronchitis   . Hypertension     borderline   . Shortness of breath     with exertion   . Sleep apnea     sleeps with oxygen at 2.5 L/[place]   . Acute sinusitis     10/03/12   . History of kidney stones     many yrs ago  . Anemia requiring transfusions 11-16-12    last 10-18-12 s/p Partial colectomy  . colon ca dx'd 10/2012    colon cancer s/p surgery 4'14, skin cancer  . 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 Scrotum exploration    . Removal of abcess  2006    pt. states abcess was located behind his tonsils  . Laparoscopic partial colectomy N/A 10/12/2012    Procedure: LAPAROSCOPIC PARTIAL COLECTOMY;  Surgeon: Adolph Pollack, MD;  Location: [facility] ORS;  Service: General;  Laterality: N/A;  . Colon surgery    . Abscess removed from throat    . Cataract extraction, bilateral    . Endobronchial ultrasound Bilateral 11/20/2012    Procedure: ENDOBRONCHIAL ULTRASOUND;  Surgeon: Leslye Peer, MD;  Location: [facility] ENDOSCOPY;  Service: Cardiopulmonary;  Laterality: Bilateral;    Ian Malkin RD, LDN Inpatient Clinical Dietitian Pager: [phone] After Hours Pager: [phone]

## 2012-12-28 ENCOUNTER — Ambulatory Visit: Payer: Medicare Other

## 2012-12-28 DIAGNOSIS — M7989 Other specified soft tissue disorders: Secondary | ICD-10-CM

## 2012-12-28 DIAGNOSIS — I714 Abdominal aortic aneurysm, without rupture: Secondary | ICD-10-CM

## 2012-12-28 DIAGNOSIS — M79609 Pain in unspecified limb: Secondary | ICD-10-CM

## 2012-12-28 LAB — SODIUM, URINE, RANDOM: Sodium, Ur: 18 mEq/L

## 2012-12-28 LAB — CBC
Hemoglobin: 8 g/dL — ABNORMAL LOW (ref 13.0–17.0)
MCH: 27.2 pg (ref 26.0–34.0)
MCHC: 31.3 g/dL (ref 30.0–36.0)
Platelets: 404 10*3/uL — ABNORMAL HIGH (ref 150–400)

## 2012-12-28 LAB — BASIC METABOLIC PANEL
Calcium: 8.9 mg/dL (ref 8.4–10.5)
GFR calc Af Amer: >90 mL/min (ref >90)
GFR calc non Af Amer: >90 mL/min (ref >90)
Glucose, Bld: 190 mg/dL — ABNORMAL HIGH (ref 70–99)
Potassium: 3.7 mEq/L (ref 3.5–5.1)
Sodium: 138 mEq/L (ref 135–145)

## 2012-12-28 LAB — GLUCOSE, CAPILLARY
Glucose-Capillary: 165 mg/dL — ABNORMAL HIGH (ref 70–99)
Glucose-Capillary: 232 mg/dL — ABNORMAL HIGH (ref 70–99)

## 2012-12-28 LAB — CREATININE, URINE, RANDOM: Creatinine, Urine: 81.1 mg/dL

## 2012-12-28 MED ORDER — LEVOFLOXACIN 500 MG PO TABS: 500.0000 mg | ORAL_TABLET | Freq: Every day | ORAL | Status: DC

## 2012-12-28 MED ORDER — PREDNISONE 50 MG PO TABS: ORAL_TABLET | ORAL | Status: DC

## 2012-12-28 NOTE — Progress Notes (Signed)
Patient discharge to home wife at bedside. D/c instructions  And follow up appointments done and was given to the wife. PIV removed no s/s of infiltration or swelling in insertion site.  Patient is on 2L [place] at 100% Sats,  And is on 2L [place] at home.  Wife stated that their own small O2 tank is already empty since its liquified oxygen it leaked. Patient's wife doesn't want to go home  To get O2 she said its 72 mls away. Wife Wants to borrow our O2 tank and will just return it tomorrow, was explained to them that it will not be allowed by the hospital . Patient has HH agency [facility]) but wife said we cannot wait for them to come here. CN was  Made aware made all the necessary  Coordinations, which she was able to talk with the [facility] agency. Wife said they cannot wait and they need to go home even without the oxygen. I told them that I am not really comfortable for the patient to be discharged without O2 but the wife was insistent/persistent. CN made aware, they were told that whatever happens to the patient on their way home  The hospital will not be responsible anymore. Wife still insisted of going home even if  patient without O2. Patient discharge via wheelechair, no s/s of distress.

## 2012-12-28 NOTE — Progress Notes (Signed)
Received referral from [facility] program for [facility] Care Management services. Went to bedside to speak with patient. However, patient was just discharged. Nursing reports wife was anxious to leave. Will follow up telephonically to see if patient interested in [facility] Care Management services. Raiford Noble, MSN- Ed, RN,BSN- [facility] [phone]

## 2012-12-28 NOTE — Progress Notes (Signed)
Occupational Therapy Treatment Patient Details Name: Alan [name] MRN: [id number] DOB: Feb 13, 1932 Today's Date: 12/28/2012 Time: 4098-1191 OT Time Calculation (min): 28 min  OT Assessment / Plan / Recommendation Comments on Treatment Session Further educated pt and spouse on energy conservation strategies and discussed a tubseat for showering. Pt states he will obtain on his own. Pt doing well today and states he feels much better.     Follow Up Recommendations  No OT follow up;Supervision/Assistance - 24 hour    Barriers to Discharge       Equipment Recommendations  None recommended by OT (pt states he will obtain tubseat on his own. No 3in1 needed)    Recommendations for Other Services    Frequency Min 2X/week   Plan Discharge plan remains appropriate    Precautions / Restrictions Precautions Precaution Comments: monitor sats Restrictions Weight Bearing Restrictions: No   Pertinent Vitals/Pain No c/o    ADL  Toilet Transfer: Performed;Supervision/safety Toilet Transfer Method: Other (comment) (no device) Toilet Transfer Equipment: Comfort height toilet (states similar toilet height at home. no grab bar use) Toileting - Clothing Manipulation and Hygiene: Simulated;Supervision/safety Where Assessed - Toileting Clothing Manipulation and Hygiene: Sit to stand from 3-in-1 or toilet Tub/Shower Transfer: Simulated;Supervision/safety Tub/Shower Transfer Method:  (step over tub hold to wall of door simulate hold to shower ) ADL Comments: Pt doing well today. Wife present. Pt states he feels much better today. Educated on energy conservation techniques and issued handout and reviewed with pt/spouse. Discussed a tubseat and pt verbalizes that this would be helpful and that he will obtain one one his own instead of ordering here. Encouraged other energy conservation techniques from handout including purse lip breathing, rest breaks as needed, luke warm water, and others. Pt and wife  found information to be helpful. Pt does move alittle quickly and stepped over simulated tub alittle fast so encouraged him to slow down for safety. Pt does not need a 3in1.     OT Diagnosis:    OT Problem List:   OT Treatment Interventions:     OT Goals Acute Rehab OT Goals OT Goal Formulation: With patient/family Time For Goal Achievement: 01/10/13 Potential to Achieve Goals: Good ADL Goals Pt Will Perform Grooming: with supervision;Standing at sink (2 tasks) Pt Will Transfer to Toilet: with supervision;Ambulation;3-in-1 (to comfort toilet similiar to home) ADL Goal: Toilet Transfer - Progress: Met Pt Will Perform Toileting - Clothing Manipulation: with supervision;Standing ADL Goal: Toileting - Clothing Manipulation - Progress: Met Pt Will Perform Tub/Shower Transfer: Tub transfer;with supervision;with DME ADL Goal: Tub/Shower Transfer - Progress: Met Additional ADL Goal #1: Pt will verbalize 3 energy conservation techniques independently for ADL. ADL Goal: Additional Goal #1 - Progress: Progressing toward goals Additional ADL Goal #2: Pt will gather all items for B/D and perform at supervision level.  Visit Information  Last OT Received On: 12/28/12 Assistance Needed: +1    Subjective Data  Subjective: I am feeling super today Patient Stated Goal: wants to do what he can for himself   Prior Functioning       Cognition  Cognition Arousal/Alertness: Awake/alert Behavior During Therapy: WFL for tasks assessed/performed Overall Cognitive Status: Within Functional Limits for tasks assessed    Mobility  Transfers Transfers: Sit to Stand;Stand to Sit Sit to Stand: 5: Supervision;From chair/3-in-1 Stand to Sit: 5: Supervision;To chair/3-in-1    Exercises      Balance Balance Balance Assessed: Yes Dynamic Standing Balance Dynamic Standing - Level of Assistance: 5: Stand by assistance  End of Session OT - End of Session Activity Tolerance: Patient tolerated treatment  well Patient left: in chair;with call bell/phone within reach  GO     Lennox Laity [phone] 12/28/2012, 9:33 AM

## 2012-12-28 NOTE — Progress Notes (Signed)
*  Preliminary Results* Left lower extremity venous duplex completed. Left lower extremity is negative for deep vein thrombosis. There is no evidence of left Baker's cyst.  Preliminary results discussed with Delorise Shiner, RN.  12/28/2012 12:17 PM Gertie Fey, RVT, RDCS, RDMS

## 2012-12-28 NOTE — Discharge Summary (Signed)
Physician Discharge Summary  EIN RIJO OZH:[id number] DOB: Sep 13, 1931 DOA: 12/26/2012  PCP: Redmond Baseman, MD  Admit date: 12/26/2012 Discharge date: 12/28/2012  Recommendations for Outpatient Follow-up:  1. Pt will need to follow up with PCP in 2-3 weeks post discharge 2. Please obtain BMP to evaluate electrolytes and kidney function 3. Please also check CBC to evaluate Hg and Hct levels 4. Please note that patient was discharged on Levaquin to complete the therapy for 5 more days post discharge 5. PT was also discharged on Prednisone taper pack over the 5 days period, pt advised to stay on continuous oxygen at home until seen by PCP  Discharge Diagnoses: Acute on chronic respiratory failure secondary to radiation pneumonitis and COPD exacerbation  Principal Problem:   Acute-on-chronic respiratory failure Active Problems:   DM (diabetes mellitus) type 2   HTN (hypertension)   Acute exacerbation of chronic obstructive pulmonary disease   Lung cancer, main bronchus   Lower extremity edema   Moderate protein-calorie malnutrition   Pneumonia  Discharge Condition: Stable  Diet recommendation: Heart healthy diet discussed in details   History of present illness:  79 y.o. male with multiple medical comorbidities including but not limited to COPD on home oxygen, non-small cell lung cancer of the left bronchus status post palliative radiation to the chest, stage I colon cancer in April 2014, abdominal aortic aneurysm status post percutaneous graft, diabetes who presented to [facility] ED 12/26/2012 with worsening shortness of breath for past 2 weeks prior to this admission. Patient reports that supplemental oxygen did not provide any symptomatic relief. In addition, patient reported nonproductive cough but no associated fevers.   In ED, blood pressure 85/47 which has improved to 123/55 with IV fluids given in ED. Patient remains afebrile with Tmax of 98.5 F and oxygen saturation of 97%  on 2 L nasal cannula. CT chest angio was negative for pulmonary embolism. Chest x-ray was significant for increased densities in the right hilum and right lung base as well as likely progression of metastatic disease. CBC was significant for hemoglobin of 9.3. BMP revealed hyponatremia of 133.   Assessment and plan:  Principal Problem:  Acute-on-chronic respiratory failure with hypoxia  - Likely radiation pneumonitis as well as COPD exacerbation, CT angio negative for PE, venous doppler negative for DVT  - Respiratory status is currently stable and pt is no longer wheezing on exam - Continue Levaquin for 5 more days post discharge along with Prednisone taper pack, oxygen continuous flow  - Albuterol inhaler every 6 hours as needed; continue Spiriva  - pt advised to follow up with PCP, oncologist and pulmonologist  Active Problems:  DM (diabetes mellitus) type 2  - Appreciate diabetic coordinator consultation  - Continued NovoLog 3 units with meals and Levemir 5 units at bedtime while inpatient  - pt advised to continue Metformin upon discharge  HTN (hypertension)  - Continue lisinopril 5 mg daily  Acute exacerbation of chronic obstructive pulmonary disease  - Management as above with Levaquin, steroids and Spiriva  - Albuterol inhaler as needed  Lung cancer, main bronchus  - Management per oncology  - Status post palliative radiation to the chest  Moderate protein-calorie malnutrition  - Appreciate nutrition consultation - nutritional supplementation discussed and pt verbalized understanding   Procedures/Studies: Ct Angio Chest Pe W/cm &/or Wo Cm 12/27/2012    No evidence of acute pulmonary thromboembolism.  There are confluent pulmonary parenchymal opacities associated with interstitial lung disease primarily in the right lung.  These  findings may reflect radiation pneumonitis however metastatic disease is not excluded.  Small nodules are subjectively larger.  Small right pleural  effusion.  This can be associated with radiation pneumonitis.     Nm Pet Image Initial (pi) Skull Base To Thigh 12/05/2012   1. Hypermetabolic metastatic disease involving nodal stations in the neck bilaterally, the mediastinum, and the retroperitoneum. Lytic hypermetabolic bony lesions in the right mandible and right proximal femoral diaphysis (this latter lesion is only partially included).  2.  6.1 cm infrarenal fusiform abdominal aortic aneurysm.  3.  There are several tiny lung nodules in the upper lobes and right middle lobe, technically too small to characterize by PET CT.     Dg Chest Portable 1 View 12/26/2012    Increased densities in the right hilum and right lung base. Findings could represent progression of nodal and metastatic disease.  Blunting at the right costophrenic angle may represent pleural fluid.  Prominent lung markings in the right lower lung could represent an infectious or inflammatory process.   Consultations:  None   Antibiotics:  Levaquin 12/26/2012 --> to complete therapy for 5 more days post discharge   Discharge Exam: Filed Vitals:   12/28/12 0500  BP: 136/64  Pulse: 105  Temp: 97.5 F (36.4 C)  Resp: 20   Filed Vitals:   12/27/12 2140 12/28/12 0500 12/28/12 0816 12/28/12 0910  BP: 119/51 136/64    Pulse: 103 105    Temp: 97.6 F (36.4 C) 97.5 F (36.4 C)    TempSrc: Oral Oral    Resp: 18 20    Height:      Weight:  68.3 kg (150 lb 9.2 oz)    SpO2: 96% 100% 95% 96%    General: Pt is alert, follows commands appropriately, not in acute distress, frail  Cardiovascular: Regular rate and rhythm, S1/S2 +, no murmurs, no rubs, no gallops Respiratory: Clear to auscultation bilaterally, no wheezing, decreased breath sounds at bases, no crackles  Abdominal: Soft, non tender, non distended, bowel sounds +, no guarding Extremities: +2 bilateral pitting LE edema, no cyanosis, pulses palpable bilaterally DP and PT Neuro: Grossly nonfocal  Discharge  Instructions  Discharge Orders   Future Appointments Provider Department Dept Phone   01/11/2013 12:00 PM Ladene Artist, MD [facility] MEDICAL ONCOLOGY [phone]   01/16/2013 10:20 AM Ileana Ladd, MD [facility] FAMILY MEDICINE [phone]   Future Orders Complete By Expires     Diet - low sodium heart healthy  As directed     Increase activity slowly  As directed         Medication List    TAKE these medications       albuterol (2.5 MG/3ML) 0.083% nebulizer solution  Commonly known as:  PROVENTIL  Take 2.5 mg by nebulization every 6 (six) hours as needed for wheezing or shortness of breath.     albuterol 108 (90 BASE) MCG/ACT inhaler  Commonly known as:  PROVENTIL HFA;VENTOLIN HFA  Inhale 2 puffs into the lungs every 6 (six) hours as needed for wheezing.     clobetasol ointment 0.05 %  Commonly known as:  TEMOVATE  Apply 1 application topically 2 (two) times daily. Apply to arms     fluticasone 50 MCG/ACT nasal spray  Commonly known as:  FLONASE  Place 2 sprays into the nose daily.     ketoconazole 2 % cream  Commonly known as:  NIZORAL  Apply 1 application topically daily. Apply to face  levofloxacin 500 MG tablet  Commonly known as:  LEVAQUIN  Take 1 tablet (500 mg total) by mouth daily.     lisinopril 5 MG tablet  Commonly known as:  PRINIVIL,ZESTRIL  Take 5 mg by mouth every morning.     loratadine 10 MG tablet  Commonly known as:  CLARITIN  Take 10 mg by mouth daily as needed for allergies.     metFORMIN 500 MG tablet  Commonly known as:  GLUCOPHAGE  Take 500 mg by mouth 2 (two) times daily with a meal.     predniSONE 50 MG tablet  Commonly known as:  DELTASONE  Take 50 tablet today, taper down by 10 mg tablet daily until completed     SPIRIVA HANDIHALER 18 MCG inhalation capsule  Generic drug:  tiotropium  Place 18 mcg into inhaler and inhale daily.     SYMBICORT 160-4.5 MCG/ACT inhaler  Generic drug:   budesonide-formoterol  Inhale 2 puffs into the lungs 2 (two) times daily.     tamsulosin 0.4 MG Caps  Commonly known as:  FLOMAX  Take 0.4 mg by mouth daily.           Follow-up Information   Follow up with Redmond Baseman, MD In 2 weeks.   Contact information:   [address] [place] [address] [phone]        The results of significant diagnostics from this hospitalization (including imaging, microbiology, ancillary and laboratory) are listed below for reference.     Microbiology: No results found for this or any previous visit (from the past 240 hour(s)).   Labs: Basic Metabolic Panel:  Recent Labs Lab 12/26/12 1945 12/27/12 0100 12/28/12 0530  NA 133* 133* 138  K 4.1 3.7 3.7  CL 95* 95* 99  CO2 28 30 32  GLUCOSE 120* 144* 190*  BUN 8 8 11   CREATININE 0.44* 0.45* 0.44*  CALCIUM 8.9 9.0 8.9   Liver Function Tests:  Recent Labs Lab 12/26/12 1945 12/27/12 0100  AST 22 11  ALT 10 8  ALKPHOS 70 67  BILITOT 0.3 0.4  PROT 7.1 6.9  ALBUMIN 2.3* 2.3*   CBC:  Recent Labs Lab 12/26/12 1945 12/27/12 0100 12/28/12 0530  WBC 5.1 5.1 7.8  NEUTROABS 4.1  --   --   HGB 9.3* 8.7* 8.0*  HCT 28.4* 26.7* 25.6*  MCV 86.3 87.0 87.1  PLT 345 338 404*   BNP: BNP (last 3 results)  Recent Labs  12/26/12 1945  PROBNP 215.4   CBG:  Recent Labs Lab 12/27/12 1138 12/27/12 1704 12/27/12 2212 12/28/12 0739 12/28/12 1159  GLUCAP 194* 262* 242* 165* 232*   SIGNED: Time coordinating discharge: Over 30 minutes  Debbora Presto, MD  [facility] 12/28/2012, 12:15 PM Pager [phone]  If 7PM-7AM, please contact night-coverage [email or web address] Password [email or web address]

## 2012-12-29 ENCOUNTER — Ambulatory Visit: Payer: Medicare Other

## 2012-12-29 NOTE — Progress Notes (Signed)
Discharge summary sent to payer through [facility]  

## 2013-01-01 ENCOUNTER — Ambulatory Visit: Payer: Medicare Other

## 2013-01-02 ENCOUNTER — Ambulatory Visit: Payer: Medicare Other

## 2013-01-03 ENCOUNTER — Ambulatory Visit: Payer: Medicare Other

## 2013-01-04 ENCOUNTER — Ambulatory Visit: Payer: Medicare Other

## 2013-01-05 ENCOUNTER — Ambulatory Visit: Payer: Medicare Other

## 2013-01-08 ENCOUNTER — Ambulatory Visit: Payer: Medicare Other

## 2013-01-09 ENCOUNTER — Ambulatory Visit: Payer: Medicare Other

## 2013-01-10 ENCOUNTER — Ambulatory Visit: Payer: Medicare Other

## 2013-01-11 ENCOUNTER — Ambulatory Visit (HOSPITAL_BASED_OUTPATIENT_CLINIC_OR_DEPARTMENT_OTHER): Admitting: Oncology

## 2013-01-11 ENCOUNTER — Ambulatory Visit (HOSPITAL_BASED_OUTPATIENT_CLINIC_OR_DEPARTMENT_OTHER): Payer: Medicare Other | Admitting: Lab

## 2013-01-11 ENCOUNTER — Ambulatory Visit: Payer: Medicare Other

## 2013-01-11 ENCOUNTER — Telehealth: Payer: Self-pay | Admitting: *Deleted

## 2013-01-11 VITALS — BP 116/55 | HR 113 | Temp 97.5°F | Resp 18 | Ht 65.0 in | Wt 149.9 lb

## 2013-01-11 DIAGNOSIS — D649 Anemia, unspecified: Secondary | ICD-10-CM

## 2013-01-11 LAB — CBC WITH DIFFERENTIAL/PLATELET
BASO%: 0.1 % (ref 0.0–2.0)
Eosinophils Absolute: 0.1 10*3/uL (ref 0.0–0.5)
MCHC: 32.9 g/dL (ref 32.0–36.0)
MONO#: 0.5 10*3/uL (ref 0.1–0.9)
NEUT#: 5.8 10*3/uL (ref 1.5–6.5)
RBC: 3.37 10*6/uL — ABNORMAL LOW (ref 4.20–5.82)
RDW: 19.5 % — ABNORMAL HIGH (ref 11.0–14.6)
WBC: 7.3 10*3/uL (ref 4.0–10.3)
lymph#: 0.8 10*3/uL — ABNORMAL LOW (ref 0.9–3.3)

## 2013-01-11 LAB — HOLD TUBE, BLOOD BANK

## 2013-01-11 NOTE — Telephone Encounter (Signed)
appts made and printed...td 

## 2013-01-11 NOTE — Progress Notes (Signed)
   [facility]    OFFICE PROGRESS NOTE   INTERVAL HISTORY:   He returns as scheduled. Mr. Malenfant has enrolled in the hospice program. No pain. He was admitted on 12/26/2012 with a COPD exacerbation. His symptoms improved with medical therapy. A CT the chest on 12/27/2012 revealed no evidence of pulmonary embolism. Confluent opacities in the right lung felt to potentially represent radiation pneumonitis or metastatic disease. A small right pleural effusion was noted.  He reports the symptoms improved with medical therapy. He remains on home oxygen. He completed an outpatient prednisone taper.  Objective:  Vital signs in last 24 hours:  Blood pressure 116/55, pulse 113, temperature 97.5 F (36.4 C), temperature source Oral, resp. rate 18, height 5\' 5"  (1.651 m), weight 149 lb 14.4 oz (67.994 kg), SpO2 97.00%.    HEENT: No thrush Resp: Distant breath sounds, decreased breath sounds at the right compared to the left chest, no respiratory distress Cardio: Regular rate and rhythm GI: No hepatomegaly, nontender Vascular: Trace pitting edema at the left greater than right ankle   Lab Results:  Lab Results  Component Value Date   WBC 7.3 01/11/2013   HGB 9.7* 01/11/2013   HCT 29.4* 01/11/2013   MCV 87.3 01/11/2013   PLT 340 01/11/2013      Medications: I have reviewed the patient's current medications.  Assessment/Plan: 1.Non-small cell lung cancer-obstructing left endobronchial lesion with chest lymphadenopathy, status post a bronchoscopy/EBUS 11/20/2012 with the pathology confirming adenocarcinoma of lung primary , ALK negative  -Staging PET scan 12/05/2012 consistent with metastatic neck/chest adenopathy, retroperitoneal adenopathy, and bone metastases  -Status post palliative radiation to the left chest completed 12/15/2012  2. Respiratory failure following the bronchoscopy procedure 11/20/2012, now maintained on supplemental oxygen and bronchodilator therapy -admitted  with acute respiratory failure 12/26/2012-improved following a steroid taper 3. Stage II colon cancer April 2014  4. Abdominal aortic aneurysm-planned percutaneous graft repair has been placed on hold  5. Diabetes  6. Anemia  7. Markedly elevated CEA-most likely secondary to the non-small cell lung cancer    Disposition:  Mr. Steenson was admitted with increased dyspnea on 12/26/2012. His symptoms improved with medical therapy. The dyspnea was likely related to radiation pneumonitis versus a COPD flare. However increased dyspnea could be related to progressive tumor in the lungs. The plan is to continue a comfort care approach. He will continue followup with the hospice nurses. He will return for an office visit in approximately 5 weeks. Mr. Montz will contact us in the interim as needed.   Thornton Papas, MD  01/11/2013  1:30 PM

## 2013-01-12 NOTE — Progress Notes (Signed)
  Radiation Oncology         [phone] ________________________________  Name: Alan [name] MRN: [id number]  Date: 11/30/2012  DOB: [birth year]  End of Treatment Note  Diagnosis:   Lung cancer     Indication for treatment:  Curative pending further workup       Radiation treatment dates:   11/24/2012 through 11/30/2012  Site/dose:   The patient was treated to the lung using a three-field 3-D conformal technique. The patient's treatment focused on the left main stem bronchus region and surrounding involved lung/mediastinum. He received 9 gray in 5 fractions at 1.8 gray per fraction. This was his total treatment as the patient made a decision to continue his treatment at an outside facility.  Narrative: The patient tolerated radiation treatment relatively well.   He did not have substantial difficulties with his treatment. No acute toxicity.  Plan: The patient will continue to receive his care in [place].  ________________________________  Radene Gunning, M.D., Ph.D.

## 2013-01-15 ENCOUNTER — Telehealth: Payer: Self-pay | Admitting: Dietician

## 2013-01-15 ENCOUNTER — Ambulatory Visit: Payer: Medicare Other

## 2013-01-16 ENCOUNTER — Ambulatory Visit: Payer: Medicare Other

## 2013-01-16 ENCOUNTER — Encounter (INDEPENDENT_AMBULATORY_CARE_PROVIDER_SITE_OTHER): Payer: Medicare Other | Admitting: Family Medicine

## 2013-01-18 NOTE — Progress Notes (Signed)
Patient ID: Alan [name], male   DOB: [birth year], 79 y.o.   MRN: [id number] No seen Alan [name], M.D.

## 2013-02-05 ENCOUNTER — Other Ambulatory Visit: Payer: Self-pay

## 2013-02-05 MED ORDER — LISINOPRIL 5 MG PO TABS: 5.0000 mg | ORAL_TABLET | Freq: Every morning | ORAL | Status: DC

## 2013-02-05 MED ORDER — METFORMIN HCL 500 MG PO TABS: 500.0000 mg | ORAL_TABLET | Freq: Two times a day (BID) | ORAL | Status: DC

## 2013-02-05 MED ORDER — TAMSULOSIN HCL 0.4 MG PO CAPS: 0.4000 mg | ORAL_CAPSULE | Freq: Every day | ORAL | Status: DC

## 2013-02-05 NOTE — Telephone Encounter (Signed)
x

## 2013-02-14 ENCOUNTER — Other Ambulatory Visit: Payer: Self-pay | Admitting: *Deleted

## 2013-02-14 DIAGNOSIS — C189 Malignant neoplasm of colon, unspecified: Secondary | ICD-10-CM

## 2013-02-15 ENCOUNTER — Other Ambulatory Visit (HOSPITAL_BASED_OUTPATIENT_CLINIC_OR_DEPARTMENT_OTHER): Payer: Medicare Other | Admitting: Lab

## 2013-02-15 ENCOUNTER — Telehealth: Payer: Self-pay | Admitting: *Deleted

## 2013-02-15 ENCOUNTER — Ambulatory Visit (HOSPITAL_BASED_OUTPATIENT_CLINIC_OR_DEPARTMENT_OTHER): Payer: Medicare Other | Admitting: Oncology

## 2013-02-15 VITALS — BP 123/69 | HR 101 | Temp 97.5°F | Resp 18 | Ht 65.0 in | Wt 152.7 lb

## 2013-02-15 DIAGNOSIS — C7951 Secondary malignant neoplasm of bone: Secondary | ICD-10-CM

## 2013-02-15 DIAGNOSIS — C189 Malignant neoplasm of colon, unspecified: Secondary | ICD-10-CM

## 2013-02-15 DIAGNOSIS — C3400 Malignant neoplasm of unspecified main bronchus: Secondary | ICD-10-CM

## 2013-02-15 LAB — CBC WITH DIFFERENTIAL/PLATELET
BASO%: 0.4 % (ref 0.0–2.0)
Basophils Absolute: 0 10*3/uL (ref 0.0–0.1)
EOS%: 2.9 % (ref 0.0–7.0)
HCT: 32 % — ABNORMAL LOW (ref 38.4–49.9)
HGB: 10.4 g/dL — ABNORMAL LOW (ref 13.0–17.1)
MONO#: 0.6 10*3/uL (ref 0.1–0.9)
NEUT%: 72.8 % (ref 39.0–75.0)
RDW: 15.9 % — ABNORMAL HIGH (ref 11.0–14.6)
WBC: 7.2 10*3/uL (ref 4.0–10.3)
lymph#: 1.1 10*3/uL (ref 0.9–3.3)

## 2013-02-15 MED ORDER — HYDROCODONE-ACETAMINOPHEN 5-325 MG PO TABS: 1.0000 | ORAL_TABLET | Freq: Four times a day (QID) | ORAL | Status: DC | PRN

## 2013-02-15 NOTE — Telephone Encounter (Signed)
Pt is aware that i emailed [facility] for a time slot...td

## 2013-02-15 NOTE — Progress Notes (Signed)
   [facility]    OFFICE PROGRESS NOTE   INTERVAL HISTORY:   He returns as scheduled. He has noted improvement in dyspnea. He is not wearing oxygen consistently. He recently developed increased pain at the right jaw with associated numbness. He reports 4 to extractions at the right lower gumline, but the pain has persisted. He is not taking pain medication. Good appetite.  Objective:  Vital signs in last 24 hours:  Blood pressure 123/69, pulse 101, temperature 97.5 F (36.4 C), temperature source Oral, resp. rate 18, height 5\' 5"  (1.651 m), weight 152 lb 11.2 oz (69.264 kg), SpO2 96.00%.    HEENT: Status post right lower tooth extractions. There is a mass at the inferior aspect of the right parotid/mandible. No fluctuance at the right cheek. There is erythema overlying the lower aspect of the right cheek (he reports this is chronic). Lymphatics: Firm left supraclavicular and right posterior cervical node Resp: Distant breath sounds, good air movement bilaterally, no respiratory distress Cardio: Regular rate and rhythm GI: No hepatomegaly Vascular: No leg edema   Medications: I have reviewed the patient's current medications.  Assessment/Plan: 1.Non-small cell lung cancer-obstructing left endobronchial lesion with chest lymphadenopathy, status post a bronchoscopy/EBUS 11/20/2012 with the pathology confirming adenocarcinoma of lung primary , ALK negative  -Staging PET scan 12/05/2012 consistent with metastatic neck/chest adenopathy, retroperitoneal adenopathy, and bone metastases  -Status post palliative radiation to the left chest completed 12/15/2012  2. Respiratory failure following the bronchoscopy procedure 11/20/2012, now maintained on supplemental oxygen and bronchodilator therapy -admitted with acute respiratory failure 12/26/2012-improved following a steroid taper  3. Stage II colon cancer April 2014  4. Abdominal aortic aneurysm-planned percutaneous graft  repair has been placed on hold  5. Diabetes  6. Anemia  7. Markedly elevated CEA-most likely secondary to the non-small cell lung cancer  8. Pain/mass at the right mandible-I reviewed the 12/05/2012 PET scan with Mr. Leider and his family. There is a destructive mass at the posterior aspect of the right mandible. This likely explains his pain in the right facial numbness.   Disposition:  His overall performance status appears improved. He has a destructive mass of the right mandible causing pain. He does not wish to pursue palliative radiation at present. We prescribed hydrocodone to use as needed for pain. He will contact us if the pain worsens. Mr. Rickett continues followup with the [facility] program. He will return for an office visit in one month.   Thornton Papas, MD  02/15/2013  1:49 PM

## 2013-02-16 ENCOUNTER — Telehealth: Payer: Self-pay | Admitting: *Deleted

## 2013-02-16 NOTE — Telephone Encounter (Signed)
Lm gv appt d/t for 03/16/13@ 11am. i also made the pt aware that i would mail a letter/avs as well...td

## 2013-02-19 ENCOUNTER — Other Ambulatory Visit: Payer: Self-pay | Admitting: *Deleted

## 2013-02-19 ENCOUNTER — Telehealth: Payer: Self-pay | Admitting: *Deleted

## 2013-02-19 DIAGNOSIS — C3400 Malignant neoplasm of unspecified main bronchus: Secondary | ICD-10-CM

## 2013-02-19 NOTE — Telephone Encounter (Signed)
Patient has decided he wants to have palliative radiation to his right mandible. Referral ordered per Dr. Truett Perna. Daughter made aware that referral has been made.

## 2013-02-20 ENCOUNTER — Other Ambulatory Visit: Payer: Self-pay | Admitting: *Deleted

## 2013-02-20 DIAGNOSIS — C189 Malignant neoplasm of colon, unspecified: Secondary | ICD-10-CM

## 2013-02-20 DIAGNOSIS — C3400 Malignant neoplasm of unspecified main bronchus: Secondary | ICD-10-CM

## 2013-02-21 ENCOUNTER — Encounter: Payer: Self-pay | Admitting: Oncology

## 2013-02-21 NOTE — Progress Notes (Signed)
Histology and Location of Primary Cancer: non-small cell lung cancer with mass on right mandable  Sites of Visceral and Bony Metastatic Disease:  metastatic neck/chest adenopathy, retroperitoneal adenopathy, and bone metastases, mass at posterior aspect of the right mandible  Location(s) of Symptomatic Metastases: posterior aspect of right mandable  Past/Anticipated chemotherapy by medical oncology, if any: none. Saw Dr. Truett Perna 02/15/2013  Pain on a scale of 0-10 is:   Ambulatory status? Walker? Wheelchair?:   SAFETY ISSUES:  Prior radiation? 11/24/2012-11/30/2012 9 gray to left lung/mediastinum  Pacemaker/ICD? no  Possible current pregnancy? no  Is the patient on methotrexate? no  Current Complaints / other details:

## 2013-02-22 ENCOUNTER — Encounter: Payer: Self-pay | Admitting: Radiation Oncology

## 2013-02-22 ENCOUNTER — Ambulatory Visit
Admission: RE | Admit: 2013-02-22 | Discharge: 2013-02-22 | Disposition: A | Discharge status: Home / Self Care | Source: Ambulatory Visit | Attending: Radiation Oncology | Admitting: Radiation Oncology

## 2013-02-22 VITALS — BP 128/77 | HR 103 | Temp 98.1°F | Resp 22 | Ht 65.0 in | Wt 157.0 lb

## 2013-02-22 DIAGNOSIS — M899 Disorder of bone, unspecified: Secondary | ICD-10-CM | POA: Insufficient documentation

## 2013-02-22 DIAGNOSIS — C3402 Malignant neoplasm of left main bronchus: Secondary | ICD-10-CM

## 2013-02-22 DIAGNOSIS — M949 Disorder of cartilage, unspecified: Secondary | ICD-10-CM | POA: Insufficient documentation

## 2013-02-22 DIAGNOSIS — C3400 Malignant neoplasm of unspecified main bronchus: Secondary | ICD-10-CM

## 2013-02-22 DIAGNOSIS — R6884 Jaw pain: Secondary | ICD-10-CM | POA: Insufficient documentation

## 2013-02-22 DIAGNOSIS — C3490 Malignant neoplasm of unspecified part of unspecified bronchus or lung: Secondary | ICD-10-CM | POA: Insufficient documentation

## 2013-02-22 DIAGNOSIS — C7951 Secondary malignant neoplasm of bone: Secondary | ICD-10-CM | POA: Insufficient documentation

## 2013-02-22 DIAGNOSIS — C801 Malignant (primary) neoplasm, unspecified: Secondary | ICD-10-CM | POA: Insufficient documentation

## 2013-02-22 MED ORDER — BIAFINE EX EMUL
Freq: Every day | CUTANEOUS | Status: DC
Start: 2013-02-22 — End: 2013-02-23
  Administered 2013-02-22: 10:00:00 via TOPICAL

## 2013-02-22 NOTE — Progress Notes (Signed)
Dry red itchy skin noted along right mandible. Provided patient with biafine cream and encouraged to use in this area bid. Patient verbalized understanding.

## 2013-02-22 NOTE — Progress Notes (Signed)
Dr. Truett Perna offered chemotherapy but, patient refused. Patient goes on to explain that he desire to maintain quality of life. Patient reports right mandable constant pain 3 on a scale of 0-10. Patient ambulatory. Patient reports that his right lower lip is numb. Reports the frequency of his cough has greatly improved since radiation therapy to his chest however, occasionally he does have a productive cough with clear sputum. Denies dry mouth. Reports thrust has resolved. Reports weight gain since completely radiation therapy. Denies ulceration or sores instead his mouth. Denies difficulty swallowing or chewing

## 2013-02-22 NOTE — Progress Notes (Addendum)
Radiation Oncology         [phone] ________________________________  Name: Alan [name] MRN: [id number]  Date: 02/22/2013  DOB: Aug 17, 1931  Re-evaluation Note  CC: Redmond Baseman, MD  Ladene Artist, MD  Diagnosis:   Metastatic lung fields  Interval Since Last Radiation:  Approximately 2 months   Narrative:  The patient returns today for follow-up.  The patient initially receive some palliative radiotherapy to the chest here 20 was seen as an inpatient with respiratory difficulties. He then transferred the rest of his palliative radiotherapy to [facility] and he also received concurrent palliative radiotherapy to the right femur. These were the dominant areas of concern at that time. The patient had multiple areas of hypermetabolic activity on PET scan and he did have an area in the right posterior jaw which was seen on his initial workup. The patient has noticed some increasing pain in this area. He therefore is seen today for consideration of palliative radiotherapy for this issue. He really denies any other significant areas of pain. He has had some of his treatment here in [place] and also at [facility]. He currently has been seeing Dr. Truett Perna but has decided not to proceed with any chemotherapy as he is very focused on quality of life issues.                              ALLERGIES:  is allergic to sulfa antibiotics; xyzal; pseudoephedrine; and amoxicillin.  Meds: Current Outpatient Prescriptions  Medication Sig Dispense Refill  . albuterol (PROVENTIL HFA;VENTOLIN HFA) 108 (90 BASE) MCG/ACT inhaler Inhale 2 puffs into the lungs every 6 (six) hours as needed for wheezing.      Marland Kitchen albuterol (PROVENTIL) (2.5 MG/3ML) 0.083% nebulizer solution Take 2.5 mg by nebulization every 6 (six) hours as needed for wheezing or shortness of breath.       . fluticasone (FLONASE) 50 MCG/ACT nasal spray Place 2 sprays into the nose daily.       Marland Kitchen HYDROcodone-acetaminophen  (NORCO/VICODIN) 5-325 MG per tablet Take 1-2 tablets by mouth every 6 (six) hours as needed for pain.  50 tablet  1  . lisinopril (PRINIVIL,ZESTRIL) 5 MG tablet Take 1 tablet (5 mg total) by mouth every morning.  30 tablet  2  . loratadine (CLARITIN) 10 MG tablet Take 10 mg by mouth daily as needed for allergies.       . metFORMIN (GLUCOPHAGE) 500 MG tablet Take 1 tablet (500 mg total) by mouth 2 (two) times daily with a meal.  60 tablet  1  . nystatin (MYCOSTATIN) 100000 UNIT/ML suspension Take 500,000 Units by mouth 3 (three) times daily. Swish & spit      . SPIRIVA HANDIHALER 18 MCG inhalation capsule Place 18 mcg into inhaler and inhale daily.       . SYMBICORT 160-4.5 MCG/ACT inhaler Inhale 2 puffs into the lungs 2 (two) times daily.       . tamsulosin (FLOMAX) 0.4 MG CAPS Take 1 capsule (0.4 mg total) by mouth daily.  30 capsule  2   No current facility-administered medications for this encounter.    Physical Findings: The patient is in no acute distress. Patient is alert and oriented.  height is 5\' 5"  (1.651 m) and weight is 157 lb 0.2 oz (71.219 kg). His oral temperature is 98.1 F (36.7 C). His blood pressure is 128/77 and his pulse is 103. His respiration is 22 and  oxygen saturation is 93%. .   Some swelling present in the right jaw region with a palpable firm mass at the angle of the jaw on the right. No skin ulceration. The patient has some numbness along the right jaw. No lesions within the oral cavity  Lab Findings: Lab Results  Component Value Date   WBC 7.2 02/15/2013   HGB 10.4* 02/15/2013   HCT 32.0* 02/15/2013   MCV 90.1 02/15/2013   PLT 233 02/15/2013     Radiographic Findings: No results found.  Impression:    The patient is a very pleasant 79 year old male with metastatic lung cancer. He has decided not to proceed with chemotherapy but he has been having some significant pain in the right jaw region at the site of a metastasis. The pain has been fairly constant and really  does not change with anything discernible such as eating. No difficulty with swallowing. He is a good candidate for palliative radiotherapy to this area.  Plan:  The patient will undergo a CT simulation soon as this can be arranged which will be early next week. We will treat the right jaw to 30 gray in 10 fractions. I anticipate beginning his treatment next Thursday.  I spent 15 minutes with the patient today, the majority of which was spent counseling the patient on the diagnosis of cancer and coordinating care.   Radene Gunning, M.D., Ph.D.

## 2013-02-22 NOTE — Progress Notes (Signed)
Please see the Nurse Progress Note in the MD Initial Consult Encounter for this patient. 

## 2013-02-22 NOTE — Progress Notes (Signed)
Complete PATIENT MEASURE OF DISTRESS worksheet with a score of 0 submitted to social work.  

## 2013-02-27 ENCOUNTER — Ambulatory Visit
Admission: RE | Admit: 2013-02-27 | Discharge: 2013-02-27 | Disposition: A | Discharge status: Home / Self Care | Source: Ambulatory Visit | Attending: Radiation Oncology | Admitting: Radiation Oncology

## 2013-02-27 DIAGNOSIS — C7951 Secondary malignant neoplasm of bone: Secondary | ICD-10-CM

## 2013-02-27 DIAGNOSIS — Z510 Encounter for antineoplastic radiation therapy: Secondary | ICD-10-CM | POA: Insufficient documentation

## 2013-02-27 DIAGNOSIS — C3490 Malignant neoplasm of unspecified part of unspecified bronchus or lung: Secondary | ICD-10-CM | POA: Insufficient documentation

## 2013-02-27 DIAGNOSIS — C801 Malignant (primary) neoplasm, unspecified: Secondary | ICD-10-CM | POA: Insufficient documentation

## 2013-03-01 ENCOUNTER — Ambulatory Visit
Admission: RE | Admit: 2013-03-01 | Discharge: 2013-03-01 | Disposition: A | Discharge status: Home / Self Care | Source: Ambulatory Visit | Attending: Radiation Oncology | Admitting: Radiation Oncology

## 2013-03-01 NOTE — Progress Notes (Signed)
  Radiation Oncology         [phone]) [phone] ________________________________  Name: Alan [name] MRN: [id number]  Date: 02/27/2013  DOB: Jun 08, 1932  SIMULATION AND TREATMENT PLANNING NOTE  DIAGNOSIS:  Metastatic lung cancer  NARRATIVE:  The patient was brought to the CT Simulation planning suite.  Identity was confirmed.  All relevant records and images related to the planned course of therapy were reviewed.   Written consent to proceed with treatment was confirmed which was freely given after reviewing the details related to the planned course of therapy had been reviewed with the patient.  Then, the patient was set-up in a stable reproducible  supine position for radiation therapy.  CT images were obtained.  Surface markings were placed.  A customized thermoplastic head cast was constructed to help with the patient immobilization. This complex treatment device will be used for each fraction.  The CT images were loaded into the planning software.  Then the target and avoidance structures were contoured.  Treatment planning then occurred.  The radiation prescription was entered and confirmed.  A total of 3 complex treatment devices were fabricated which relate to the designed radiation treatment fields. Each of these customized fields/ complex treatment devices will be used on a daily basis during the radiation course. I have requested : 3D Simulation  I have requested a DVH of the following structures: Gross tumor volume, parotid glands, spinal cord, oral cavity.   PLAN:  The patient will receive 30 Gy in 10 fractions.  ________________________________   Radene Gunning, MD, PhD

## 2013-03-02 ENCOUNTER — Ambulatory Visit (INDEPENDENT_AMBULATORY_CARE_PROVIDER_SITE_OTHER): Payer: Medicare Other | Admitting: Family Medicine

## 2013-03-02 ENCOUNTER — Encounter: Payer: Self-pay | Admitting: Family Medicine

## 2013-03-02 ENCOUNTER — Ambulatory Visit
Admission: RE | Admit: 2013-03-02 | Discharge: 2013-03-02 | Disposition: A | Discharge status: Home / Self Care | Source: Ambulatory Visit | Attending: Radiation Oncology | Admitting: Radiation Oncology

## 2013-03-02 VITALS — BP 120/65 | HR 105 | Temp 97.4°F | Wt 154.6 lb

## 2013-03-02 DIAGNOSIS — I714 Abdominal aortic aneurysm, without rupture, unspecified: Secondary | ICD-10-CM

## 2013-03-02 DIAGNOSIS — C801 Malignant (primary) neoplasm, unspecified: Secondary | ICD-10-CM

## 2013-03-02 DIAGNOSIS — E1110 Type 2 diabetes mellitus with ketoacidosis without coma: Secondary | ICD-10-CM

## 2013-03-02 DIAGNOSIS — I10 Essential (primary) hypertension: Secondary | ICD-10-CM

## 2013-03-02 DIAGNOSIS — J309 Allergic rhinitis, unspecified: Secondary | ICD-10-CM

## 2013-03-02 DIAGNOSIS — C7951 Secondary malignant neoplasm of bone: Secondary | ICD-10-CM

## 2013-03-02 DIAGNOSIS — I6529 Occlusion and stenosis of unspecified carotid artery: Secondary | ICD-10-CM

## 2013-03-02 DIAGNOSIS — C189 Malignant neoplasm of colon, unspecified: Secondary | ICD-10-CM

## 2013-03-02 DIAGNOSIS — J9620 Acute and chronic respiratory failure, unspecified whether with hypoxia or hypercapnia: Secondary | ICD-10-CM

## 2013-03-02 DIAGNOSIS — J302 Other seasonal allergic rhinitis: Secondary | ICD-10-CM

## 2013-03-02 DIAGNOSIS — C3400 Malignant neoplasm of unspecified main bronchus: Secondary | ICD-10-CM

## 2013-03-02 DIAGNOSIS — E1310 Other specified diabetes mellitus with ketoacidosis without coma: Secondary | ICD-10-CM

## 2013-03-02 DIAGNOSIS — D649 Anemia, unspecified: Secondary | ICD-10-CM

## 2013-03-02 DIAGNOSIS — N400 Enlarged prostate without lower urinary tract symptoms: Secondary | ICD-10-CM

## 2013-03-02 DIAGNOSIS — J449 Chronic obstructive pulmonary disease, unspecified: Secondary | ICD-10-CM

## 2013-03-02 DIAGNOSIS — I739 Peripheral vascular disease, unspecified: Secondary | ICD-10-CM

## 2013-03-02 MED ORDER — LISINOPRIL 5 MG PO TABS: 5.0000 mg | ORAL_TABLET | Freq: Every morning | ORAL | Status: DC

## 2013-03-02 MED ORDER — FLUTICASONE PROPIONATE 50 MCG/ACT NA SUSP: 2.0000 | Freq: Every day | NASAL | Status: DC

## 2013-03-02 MED ORDER — METFORMIN HCL 500 MG PO TABS: 500.0000 mg | ORAL_TABLET | Freq: Two times a day (BID) | ORAL | Status: DC

## 2013-03-02 MED ORDER — TAMSULOSIN HCL 0.4 MG PO CAPS: 0.4000 mg | ORAL_CAPSULE | Freq: Every day | ORAL | Status: DC

## 2013-03-02 NOTE — Progress Notes (Signed)
Patient ID: Alan [name], male   DOB: 21-Sep-1931, 79 y.o.   MRN: [id number] SUBJECTIVE: CC: Chief Complaint  Patient presents with  . Follow-up    needs refills  refill flomax    HPI:  Patient came for follow up after a stormy course being treated for cancer. Presently needs a refill on flomax.  Patient is on O2 and is doing well since having bronchoscopy.  Past Medical History  Diagnosis Date  . AAA (abdominal aortic aneurysm)   . Bronchitis   . Shortness of breath     with exertion   . Sleep apnea     sleeps with oxygen at 2.5 L/[place]   . Acute sinusitis     10/03/12   . History of kidney stones     many yrs ago  . History of radiation therapy 11/24/2012-11/30/2012    9 gray to left lung/mediastinum  . Hypertension     borderline   . DM (diabetes mellitus) type 2 09/18/2012  . COPD (chronic obstructive pulmonary disease)   . colon ca dx'd 10/2012    colon cancer s/p surgery 4'14, skin cancer  . 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 Metastasis to bone     right mandible mass  . Anemia requiring transfusions 11-16-12    last 10-18-12 s/p Partial colectomy   Past Surgical History  Procedure Laterality Date  . Scrotum exploration    . Removal of abcess  2006    pt. states abcess was located behind his tonsils  . Laparoscopic partial colectomy N/A 10/12/2012    Procedure: LAPAROSCOPIC PARTIAL COLECTOMY;  Surgeon: Adolph Pollack, MD;  Location: [facility];  Service: General;  Laterality: N/A;  . Colon surgery    . Abscess removed from throat    . Cataract extraction, bilateral    . Endobronchial ultrasound Bilateral 11/20/2012    Procedure: ENDOBRONCHIAL ULTRASOUND;  Surgeon: Leslye Peer, MD;  Location: [facility];  Service: Cardiopulmonary;  Laterality: Bilateral;   History   Social History  . Marital Status: Married    Spouse Name: N/A    Number of Children: 2  . Years of Education: N/A   Occupational History  . retired    Production manager   Social History Main Topics  . Smoking status: Former Smoker -- 1.50 packs/day for 60 years    Types: Cigarettes    Quit date: 07/12/2001  . Smokeless tobacco: Former Neurosurgeon    Types: Chew    Quit date: 11/16/2001  . Alcohol Use: No  . Drug Use: No  . Sexual Activity: No   Other Topics Concern  . Not on file   Social History Narrative   Married to wife, Lanora Manis   Retired from Insurance account manager   Independent in ADL's   Family History  Problem Relation Age of Onset  . Heart disease Father   . Heart disease Mother    Current Outpatient Prescriptions on File Prior to Visit  Medication Sig Dispense Refill  . albuterol (PROVENTIL HFA;VENTOLIN HFA) 108 (90 BASE) MCG/ACT inhaler Inhale 2 puffs into the lungs every 6 (six) hours as needed for wheezing.      Marland Kitchen albuterol (PROVENTIL) (2.5 MG/3ML) 0.083% nebulizer solution Take 2.5 mg by nebulization every 6 (six) hours as needed for wheezing or shortness of breath.       Marland Kitchen HYDROcodone-acetaminophen (NORCO/VICODIN) 5-325 MG per tablet Take 1-2 tablets by  mouth every 6 (six) hours as needed for pain.  50 tablet  1  . loratadine (CLARITIN) 10 MG tablet Take 10 mg by mouth daily as needed for allergies.       Marland Kitchen nystatin (MYCOSTATIN) 100000 UNIT/ML suspension Take 500,000 Units by mouth 3 (three) times daily. Swish & spit      . SPIRIVA HANDIHALER 18 MCG inhalation capsule Place 18 mcg into inhaler and inhale daily.       . SYMBICORT 160-4.5 MCG/ACT inhaler Inhale 2 puffs into the lungs 2 (two) times daily.       Marland Kitchen emollient (BIAFINE) cream Apply topically as needed.       No current facility-administered medications on file prior to visit.   Allergies  Allergen Reactions  . Sulfa Antibiotics Anaphylaxis  . Xyzal [Levocetirizine] Shortness Of Breath  . Pseudoephedrine     stops pt from urinating  . Amoxicillin Rash   Immunization History  Administered Date(s) Administered  . Pneumococcal  Polysaccharide 07/12/2010   Prior to Admission medications   Medication Sig Start Date End Date Taking? Authorizing Provider  albuterol (PROVENTIL HFA;VENTOLIN HFA) 108 (90 BASE) MCG/ACT inhaler Inhale 2 puffs into the lungs every 6 (six) hours as needed for wheezing.   Yes Historical Provider, MD  albuterol (PROVENTIL) (2.5 MG/3ML) 0.083% nebulizer solution Take 2.5 mg by nebulization every 6 (six) hours as needed for wheezing or shortness of breath.  08/04/12  Yes Historical Provider, MD  fluticasone (FLONASE) 50 MCG/ACT nasal spray Place 2 sprays into the nose daily. 03/02/13  Yes Ileana Ladd, MD  HYDROcodone-acetaminophen (NORCO/VICODIN) 5-325 MG per tablet Take 1-2 tablets by mouth every 6 (six) hours as needed for pain. 02/15/13  Yes Ladene Artist, MD  lisinopril (PRINIVIL,ZESTRIL) 5 MG tablet Take 1 tablet (5 mg total) by mouth every morning. 03/02/13  Yes Ileana Ladd, MD  loratadine (CLARITIN) 10 MG tablet Take 10 mg by mouth daily as needed for allergies.    Yes Historical Provider, MD  metFORMIN (GLUCOPHAGE) 500 MG tablet Take 1 tablet (500 mg total) by mouth 2 (two) times daily with a meal. 03/02/13  Yes Ileana Ladd, MD  nystatin (MYCOSTATIN) 100000 UNIT/ML suspension Take 500,000 Units by mouth 3 (three) times daily. Swish & spit   Yes Historical Provider, MD  SPIRIVA HANDIHALER 18 MCG inhalation capsule Place 18 mcg into inhaler and inhale daily.  08/22/12  Yes Historical Provider, MD  SYMBICORT 160-4.5 MCG/ACT inhaler Inhale 2 puffs into the lungs 2 (two) times daily.  08/28/12  Yes Historical Provider, MD  tamsulosin (FLOMAX) 0.4 MG CAPS capsule Take 1 capsule (0.4 mg total) by mouth daily. 03/02/13  Yes Ileana Ladd, MD  emollient (BIAFINE) cream Apply topically as needed.    Historical Provider, MD     ROS: As above in the HPI. All other systems are stable or negative.  OBJECTIVE: APPEARANCE:  Patient in no acute distress.The patient appeared well nourished and normally  developed. Acyanotic. Waist: VITAL SIGNS:BP 120/65  Pulse 105  Temp(Src) 97.4 F (36.3 C) (Oral)  Wt 154 lb 9.6 oz (70.126 kg)  BMI 25.73 kg/m2  SpO2 96%  Elderly WM on O2  SKIN: warm and  Dry without overt rashes, tattoos and scars  HEAD and Neck: without JVD, Head and scalp: normal Eyes:No scleral icterus. Fundi normal, eye movements normal. Ears: Auricle normal, canal normal, Tympanic membranes normal, insufflation normal. Nose: normal Throat: normal Neck & thyroid: normal  CHEST & LUNGS: Chest  wall: normal Lungs: Coarse breath sounds  CVS: Reveals the PMI to be normally located. Regular rhythm, First and Second Heart sounds are normal,  absence of murmurs, rubs or gallops. Peripheral vasculature: Radial pulses: normal Dorsal pedis pulses: normal Posterior pulses: normal  ABDOMEN:  Appearance: soft Benign, no organomegaly, no masses,No Guarding , no rebound. No Bruits. Bowel sounds: normal  RECTAL: N/A GU: N/A  EXTREMETIES: nonedematous.  NEUROLOGIC: oriented to time,place and person; nonfocal.  ASSESSMENT: AAA (abdominal aortic aneurysm) without rupture - Plan: lisinopril (PRINIVIL,ZESTRIL) 5 MG tablet  Colon cancer, right T2N0  Bone metastasis  Anemia requiring transfusions  Acute-on-chronic respiratory failure  colon ca  DM (diabetes mellitus) type 2 - Plan: metFORMIN (GLUCOPHAGE) 500 MG tablet  COPD (chronic obstructive pulmonary disease)  Hypertension - Plan: lisinopril (PRINIVIL,ZESTRIL) 5 MG tablet  Lung cancer, main bronchus, unspecified laterality  Seasonal allergic rhinitis - Plan: fluticasone (FLONASE) 50 MCG/ACT nasal spray  Peripheral vascular disease, unspecified  Occlusion and stenosis of carotid artery without mention of cerebral infarction, unspecified laterality  BPH (benign prostatic hyperplasia) - Plan: tamsulosin (FLOMAX) 0.4 MG CAPS capsule   PLAN:  Meds ordered this encounter  Medications  . tamsulosin (FLOMAX)  0.4 MG CAPS capsule    Sig: Take 1 capsule (0.4 mg total) by mouth daily.    Dispense:  90 capsule    Refill:  5  . metFORMIN (GLUCOPHAGE) 500 MG tablet    Sig: Take 1 tablet (500 mg total) by mouth 2 (two) times daily with a meal.    Dispense:  60 tablet    Refill:  5  . lisinopril (PRINIVIL,ZESTRIL) 5 MG tablet    Sig: Take 1 tablet (5 mg total) by mouth every morning.    Dispense:  30 tablet    Refill:  5  . fluticasone (FLONASE) 50 MCG/ACT nasal spray    Sig: Place 2 sprays into the nose daily.    Dispense:  16 g    Refill:  5   Results for orders placed in visit on 02/15/13  CBC WITH DIFFERENTIAL      Result Value Range   WBC 7.2  4.0 - 10.3 10e3/uL   NEUT# 5.2  1.5 - 6.5 10e3/uL   HGB 10.4 (*) 13.0 - 17.1 g/dL   HCT 81.1 (*) 91.4 - 78.2 %   Platelets 233  140 - 400 10e3/uL   MCV 90.1  79.3 - 98.0 fL   MCH 29.3  27.2 - 33.4 pg   MCHC 32.5  32.0 - 36.0 g/dL   RBC 9.56 (*) 2.13 - 0.86 10e6/uL   RDW 15.9 (*) 11.0 - 14.6 %   lymph# 1.1  0.9 - 3.3 10e3/uL   MONO# 0.6  0.1 - 0.9 10e3/uL   Eosinophils Absolute 0.2  0.0 - 0.5 10e3/uL   Basophils Absolute 0.0  0.0 - 0.1 10e3/uL   NEUT% 72.8  39.0 - 75.0 %   LYMPH% 15.9  14.0 - 49.0 %   MONO% 8.0  0.0 - 14.0 %   EOS% 2.9  0.0 - 7.0 %   BASO% 0.4  0.0 - 2.0 %   nRBC 0  0 - 0 %   Labs reviewed  Treatments reviewed. Continue with present  Treatments.  Return in about 2 months (around 05/02/2013) for Recheck medical problems.  Tamy Accardo P. [place] Charon, M.D.

## 2013-03-05 ENCOUNTER — Ambulatory Visit
Admission: RE | Admit: 2013-03-05 | Discharge: 2013-03-05 | Disposition: A | Discharge status: Home / Self Care | Source: Ambulatory Visit | Attending: Radiation Oncology | Admitting: Radiation Oncology

## 2013-03-06 ENCOUNTER — Ambulatory Visit
Admission: RE | Admit: 2013-03-06 | Discharge: 2013-03-06 | Disposition: A | Discharge status: Home / Self Care | Source: Ambulatory Visit | Attending: Radiation Oncology | Admitting: Radiation Oncology

## 2013-03-07 ENCOUNTER — Ambulatory Visit
Admission: RE | Admit: 2013-03-07 | Discharge: 2013-03-07 | Disposition: A | Discharge status: Home / Self Care | Source: Ambulatory Visit | Attending: Radiation Oncology | Admitting: Radiation Oncology

## 2013-03-07 VITALS — BP 113/78 | HR 103 | Temp 98.0°F | Ht 65.0 in | Wt 154.1 lb

## 2013-03-07 DIAGNOSIS — C7951 Secondary malignant neoplasm of bone: Secondary | ICD-10-CM

## 2013-03-07 NOTE — Progress Notes (Signed)
   Department of Radiation Oncology  Phone:  [phone] Fax:        [phone]  Weekly Treatment Note    Name: Alan [name] Date: 03/07/2013 MRN: [id number] DOB: 26-Oct-1931   Current dose: 15 Gy  Current fraction: 5   MEDICATIONS: Current Outpatient Prescriptions  Medication Sig Dispense Refill  . albuterol (PROVENTIL HFA;VENTOLIN HFA) 108 (90 BASE) MCG/ACT inhaler Inhale 2 puffs into the lungs every 6 (six) hours as needed for wheezing.      Marland Kitchen albuterol (PROVENTIL) (2.5 MG/3ML) 0.083% nebulizer solution Take 2.5 mg by nebulization every 6 (six) hours as needed for wheezing or shortness of breath.       . emollient (BIAFINE) cream Apply topically as needed.      . fluticasone (FLONASE) 50 MCG/ACT nasal spray Place 2 sprays into the nose daily.  16 g  5  . lisinopril (PRINIVIL,ZESTRIL) 5 MG tablet Take 1 tablet (5 mg total) by mouth every morning.  30 tablet  5  . loratadine (CLARITIN) 10 MG tablet Take 10 mg by mouth daily as needed for allergies.       . metFORMIN (GLUCOPHAGE) 500 MG tablet Take 1 tablet (500 mg total) by mouth 2 (two) times daily with a meal.  60 tablet  5  . SPIRIVA HANDIHALER 18 MCG inhalation capsule Place 18 mcg into inhaler and inhale daily.       . SYMBICORT 160-4.5 MCG/ACT inhaler Inhale 2 puffs into the lungs 2 (two) times daily.       . tamsulosin (FLOMAX) 0.4 MG CAPS capsule Take 1 capsule (0.4 mg total) by mouth daily.  90 capsule  5  . HYDROcodone-acetaminophen (NORCO/VICODIN) 5-325 MG per tablet Take 1-2 tablets by mouth every 6 (six) hours as needed for pain.  50 tablet  1  . nystatin (MYCOSTATIN) 100000 UNIT/ML suspension Take 500,000 Units by mouth 3 (three) times daily. Swish & spit       No current facility-administered medications for this encounter.     ALLERGIES: Sulfa antibiotics; Xyzal; Pseudoephedrine; and Amoxicillin   LABORATORY DATA:  Lab Results  Component Value Date   WBC 7.2 02/15/2013   HGB 10.4* 02/15/2013   HCT  32.0* 02/15/2013   MCV 90.1 02/15/2013   PLT 233 02/15/2013   Lab Results  Component Value Date   NA 138 12/28/2012   K 3.7 12/28/2012   CL 99 12/28/2012   CO2 32 12/28/2012   Lab Results  Component Value Date   ALT 8 12/27/2012   AST 11 12/27/2012   ALKPHOS 67 12/27/2012   BILITOT 0.4 12/27/2012     NARRATIVE: Alan [name] was seen today for weekly treatment management. The chart was checked and the patient's films were reviewed. The patient is doing very well. He states that his pain is markedly reduced. He has been given skin cream. The patient does have some fatigue.  PHYSICAL EXAMINATION: height is 5\' 5"  (1.651 m) and weight is 154 lb 1.6 oz (69.899 kg). His temperature is 98 F (36.7 C). His blood pressure is 113/78 and his pulse is 103.      some erythema in the treatment area with some dryness.  ASSESSMENT: The patient is doing satisfactorily with treatment.  PLAN: We will continue with the patient's radiation treatment as planned.  I am very pleased with the patient's response.

## 2013-03-07 NOTE — Progress Notes (Signed)
Alan [name] here for weekly under treat visit.  He has had 5 fractions to his right jaw.  He denies pain.  He does have fatigue.  The skin on his right jaw and neck is red and dry.  He has been applying biafine twice a day.  He was given the radiation therapy and you book and discussed the potential side effects including fatigue, hair loss, mouth changes and throat changes.  He was advised to notify nursing with any questions or concerns.

## 2013-03-08 ENCOUNTER — Ambulatory Visit
Admission: RE | Admit: 2013-03-08 | Discharge: 2013-03-08 | Disposition: A | Discharge status: Home / Self Care | Source: Ambulatory Visit | Attending: Radiation Oncology | Admitting: Radiation Oncology

## 2013-03-09 ENCOUNTER — Ambulatory Visit
Admission: RE | Admit: 2013-03-09 | Discharge: 2013-03-09 | Disposition: A | Discharge status: Home / Self Care | Source: Ambulatory Visit | Attending: Radiation Oncology | Admitting: Radiation Oncology

## 2013-03-13 ENCOUNTER — Ambulatory Visit
Admission: RE | Admit: 2013-03-13 | Discharge: 2013-03-13 | Disposition: A | Discharge status: Home / Self Care | Source: Ambulatory Visit | Attending: Radiation Oncology | Admitting: Radiation Oncology

## 2013-03-14 ENCOUNTER — Ambulatory Visit
Admission: RE | Admit: 2013-03-14 | Discharge: 2013-03-14 | Disposition: A | Discharge status: Home / Self Care | Source: Ambulatory Visit | Attending: Radiation Oncology | Admitting: Radiation Oncology

## 2013-03-14 ENCOUNTER — Other Ambulatory Visit: Payer: Self-pay | Admitting: Radiation Oncology

## 2013-03-14 VITALS — BP 104/72 | HR 99 | Temp 98.1°F | Ht 65.0 in | Wt 152.6 lb

## 2013-03-14 DIAGNOSIS — C7951 Secondary malignant neoplasm of bone: Secondary | ICD-10-CM

## 2013-03-14 MED ORDER — MAGIC MOUTHWASH W/LIDOCAINE: 5.0000 mL | Freq: Four times a day (QID) | ORAL | Status: DC | PRN

## 2013-03-14 NOTE — Progress Notes (Signed)
Alan [name] here for weekly under treat visit.  He has had 9 fractions to his rt jaw.  He has pain/burning in his mouth that he is rating at a 5/10.  On inspection he has some red sores on the inside of his right check.  He is having trouble eating due to the pain.  His check is also swollen and he can't get his dentures in.  He denies a dry mouth.  He has lost 2 lbs since 03/07/2013.  He states that hospice can send a dietician to talk to him. The skin on his right jaw is red and peeling.  He is using biafine cream twice a day.  If he needs a prescription today, he is requesting that it be filled through hospice of [place].  He has occasional fatigue.

## 2013-03-14 NOTE — Progress Notes (Signed)
Department of Radiation Oncology  Phone:  [phone] Fax:        [phone]  Weekly Treatment Note    Name: Alan [name] Date: 03/14/2013 MRN: [id number] DOB: 1931-10-13   Current dose: 27 Gy  Current fraction: 9   MEDICATIONS: Current Outpatient Prescriptions  Medication Sig Dispense Refill  . albuterol (PROVENTIL HFA;VENTOLIN HFA) 108 (90 BASE) MCG/ACT inhaler Inhale 2 puffs into the lungs every 6 (six) hours as needed for wheezing.      Marland Kitchen albuterol (PROVENTIL) (2.5 MG/3ML) 0.083% nebulizer solution Take 2.5 mg by nebulization every 6 (six) hours as needed for wheezing or shortness of breath.       . emollient (BIAFINE) cream Apply topically as needed.      . fluticasone (FLONASE) 50 MCG/ACT nasal spray Place 2 sprays into the nose daily.  16 g  5  . HYDROcodone-acetaminophen (NORCO/VICODIN) 5-325 MG per tablet Take 1-2 tablets by mouth every 6 (six) hours as needed for pain.  50 tablet  1  . lisinopril (PRINIVIL,ZESTRIL) 5 MG tablet Take 1 tablet (5 mg total) by mouth every morning.  30 tablet  5  . loratadine (CLARITIN) 10 MG tablet Take 10 mg by mouth daily as needed for allergies.       . metFORMIN (GLUCOPHAGE) 500 MG tablet Take 1 tablet (500 mg total) by mouth 2 (two) times daily with a meal.  60 tablet  5  . SPIRIVA HANDIHALER 18 MCG inhalation capsule Place 18 mcg into inhaler and inhale daily.       . SYMBICORT 160-4.5 MCG/ACT inhaler Inhale 2 puffs into the lungs 2 (two) times daily.       . tamsulosin (FLOMAX) 0.4 MG CAPS capsule Take 1 capsule (0.4 mg total) by mouth daily.  90 capsule  5  . Alum & Mag Hydroxide-Simeth (MAGIC MOUTHWASH W/LIDOCAINE) SOLN Take 5 mLs by mouth 4 (four) times daily as needed.  250 mL  1  . nystatin (MYCOSTATIN) 100000 UNIT/ML suspension Take 500,000 Units by mouth 3 (three) times daily. Swish & spit       No current facility-administered medications for this encounter.     ALLERGIES: Sulfa antibiotics; Xyzal; Pseudoephedrine;  and Amoxicillin   LABORATORY DATA:  Lab Results  Component Value Date   WBC 7.2 02/15/2013   HGB 10.4* 02/15/2013   HCT 32.0* 02/15/2013   MCV 90.1 02/15/2013   PLT 233 02/15/2013   Lab Results  Component Value Date   NA 138 12/28/2012   K 3.7 12/28/2012   CL 99 12/28/2012   CO2 32 12/28/2012   Lab Results  Component Value Date   ALT 8 12/27/2012   AST 11 12/27/2012   ALKPHOS 67 12/27/2012   BILITOT 0.4 12/27/2012     NARRATIVE: Alan [name] was seen today for weekly treatment management. The chart was checked and the patient's films were reviewed. The patient states that he is doing well overall. No real significant discrete pain in the right jaw. However, the patient is having some mucositis on the right which is making it more difficult to swallow. He has one more fraction remaining.  PHYSICAL EXAMINATION: height is 5\' 5"  (1.651 m) and weight is 152 lb 9.6 oz (69.219 kg). His temperature is 98.1 F (36.7 C). His blood pressure is 104/72 and his pulse is 99.      some redness and residual swelling in the right jaw. No moist desquamation. Some mucositis on the right within the oral cavity.  ASSESSMENT: The patient is doing satisfactorily with treatment.  PLAN: We will continue with the patient's radiation treatment as planned. I have given the patient prescription for Magic mouthwash with lidocaine which will hopefully help with his mucositis. D. also has some pain medicine although he is not like to use this in the past. We discussed this and possibly one half tablet would be helpful for him. At least taking something for going to bed is also something that we discussed. He is to let us know if he requires pain medicine and is not able to tolerate what we discussed. We did change his pain medicine or give something like an anti-emetic which would help with some of the nausea which she describes with his pain medicine. I would expect his body to tolerate this better over time as we  discussed.

## 2013-03-15 ENCOUNTER — Encounter: Payer: Self-pay | Admitting: Radiation Oncology

## 2013-03-15 ENCOUNTER — Ambulatory Visit
Admission: RE | Admit: 2013-03-15 | Discharge: 2013-03-15 | Disposition: A | Discharge status: Home / Self Care | Source: Ambulatory Visit | Attending: Radiation Oncology | Admitting: Radiation Oncology

## 2013-03-15 NOTE — Progress Notes (Signed)
Called in prescription for Magic Mouthwash to [facility] as ordered by Dr. Dorothy Puffer: Time 3:00pm. See script in the medication section

## 2013-03-16 ENCOUNTER — Ambulatory Visit (HOSPITAL_BASED_OUTPATIENT_CLINIC_OR_DEPARTMENT_OTHER): Payer: Medicare Other | Admitting: Oncology

## 2013-03-16 ENCOUNTER — Telehealth: Payer: Self-pay | Admitting: Oncology

## 2013-03-16 VITALS — BP 101/63 | HR 109 | Temp 97.7°F | Resp 19 | Ht 65.0 in | Wt 150.1 lb

## 2013-03-16 DIAGNOSIS — C3400 Malignant neoplasm of unspecified main bronchus: Secondary | ICD-10-CM

## 2013-03-16 DIAGNOSIS — E119 Type 2 diabetes mellitus without complications: Secondary | ICD-10-CM

## 2013-03-16 DIAGNOSIS — C189 Malignant neoplasm of colon, unspecified: Secondary | ICD-10-CM

## 2013-03-16 DIAGNOSIS — M899 Disorder of bone, unspecified: Secondary | ICD-10-CM

## 2013-03-16 DIAGNOSIS — D649 Anemia, unspecified: Secondary | ICD-10-CM

## 2013-03-16 DIAGNOSIS — K121 Other forms of stomatitis: Secondary | ICD-10-CM

## 2013-03-16 NOTE — Telephone Encounter (Signed)
gv adn printed appt sched and avs for pt fot OCT

## 2013-03-16 NOTE — Progress Notes (Signed)
   [facility]    OFFICE PROGRESS NOTE   INTERVAL HISTORY:   He returns as scheduled. He completed palliative radiation to the right mandible mass on 03/15/2013. He has noted improvement in pain. He has developed soreness in the mouth. He is not able to wear his dentures. He is losing weight secondary to having no teeth.  Objective:  Vital signs in last 24 hours:  Blood pressure 101/63, pulse 109, temperature 97.7 F (36.5 C), temperature source Oral, resp. rate 19, height 5\' 5"  (1.651 m), weight 150 lb 1.6 oz (68.085 kg), SpO2 98.00%.    HEENT: Mass at the right lower parotid/mandible, erythema/ulcerations at the right buccal mucosa and right side of the tongue. Lymphatics: Firm nodes in the low neck/supraclavicular fossa bilaterally Resp: Good air movement bilaterally, no respiratory distress Cardio: Regular rate and rhythm GI: No hepatomegaly Vascular: No leg edema  Skin: Erythema at the right side of the face     Medications: I have reviewed the patient's current medications.  Assessment/Plan: 1.Non-small cell lung cancer-obstructing left endobronchial lesion with chest lymphadenopathy, status post a bronchoscopy/EBUS 11/20/2012 with the pathology confirming adenocarcinoma of lung primary , ALK negative  -Staging PET scan 12/05/2012 consistent with metastatic neck/chest adenopathy, retroperitoneal adenopathy, and bone metastases  -Status post palliative radiation to the left chest completed 12/15/2012  2. Respiratory failure following the bronchoscopy procedure 11/20/2012, now maintained on supplemental oxygen and bronchodilator therapy -admitted with acute respiratory failure 12/26/2012-improved following a steroid taper  3. Stage II colon cancer April 2014  4. Abdominal aortic aneurysm-planned percutaneous graft repair has been placed on hold  5. Diabetes  6. Anemia  7. Markedly elevated CEA-most likely secondary to the non-small cell lung cancer  8.  Pain/mass at the right mandible-status post palliative radiation completed 03/15/2013 9. Oral Mucositis secondary to radiation    Disposition:  The right mandible pain has improved with radiation. He has developed mucositis. Hopefully this will improve over the next few weeks. Mr. Rueth will return for an office visit in one month. He continues followup with the [facility] hospice program.   Thornton Papas, MD  03/16/2013  2:24 PM

## 2013-03-19 ENCOUNTER — Telehealth: Payer: Self-pay | Admitting: Dietician

## 2013-03-24 NOTE — Progress Notes (Signed)
  Radiation Oncology         [phone] ________________________________  Name: Alan [name] MRN: [id number]  Date: 03/15/2013  DOB: 03/21/1932  End of Treatment Note  Diagnosis:   Metastatic cancer with a metastasis to the right mandibular region     Indication for treatment:  Palliative       Radiation treatment dates:   03/01/2013 through 03/15/2013  Site/dose:   The patient was treated to an area of metastasis within the right posterior mandibular region. He was treated to a dose of gray in 10 fractions. This consisted of a 3 field 3-D conformal technique. Daily image guidance was utilized.  Narrative: The patient tolerated radiation treatment relatively well.   The patient's pain markedly improved during the course of his treatment. Some mucositis on the right at the end of treatment.  Plan: The patient has completed radiation treatment. The patient will return to radiation oncology clinic for routine followup in one month. I advised the patient to call or return sooner if they have any questions or concerns related to their recovery or treatment. ________________________________  Radene Gunning, M.D., Ph.D.

## 2013-03-27 ENCOUNTER — Telehealth: Payer: Self-pay | Admitting: Dietician

## 2013-03-27 NOTE — Telephone Encounter (Signed)
Brief Outpatient Oncology Nutrition Note  Patient has been identified to be at risk on malnutrition screen.  Wt Readings from Last 10 Encounters:  03/16/13 150 lb 1.6 oz (68.085 kg)  03/14/13 152 lb 9.6 oz (69.219 kg)  03/07/13 154 lb 1.6 oz (69.899 kg)  03/02/13 154 lb 9.6 oz (70.126 kg)  02/22/13 157 lb 0.2 oz (71.219 kg)  02/15/13 152 lb 11.2 oz (69.264 kg)  01/11/13 149 lb 14.4 oz (67.994 kg)  12/28/12 150 lb 9.2 oz (68.3 kg)  12/18/12 153 lb 11.2 oz (69.718 kg)  12/13/12 154 lb (69.854 kg)    Patient with metastatic lung cancer to mandible.  Patient with variable weight.  Patient reported that mouth is much better.  Still is not wearing dentures.  Is taking liquids and Glucerna and Ensure Pudding.  Encouraged improved intake to maintain weight.  Encouraged to call for any questions.  Patient reported no needs at this time.    Oran Rein, RD, LDN

## 2013-04-11 ENCOUNTER — Encounter: Payer: Self-pay | Admitting: Radiation Oncology

## 2013-04-12 ENCOUNTER — Telehealth: Payer: Self-pay | Admitting: Oncology

## 2013-04-12 ENCOUNTER — Ambulatory Visit
Admission: RE | Admit: 2013-04-12 | Discharge: 2013-04-12 | Disposition: A | Discharge status: Home / Self Care | Source: Ambulatory Visit | Attending: Radiation Oncology | Admitting: Radiation Oncology

## 2013-04-12 ENCOUNTER — Ambulatory Visit (HOSPITAL_BASED_OUTPATIENT_CLINIC_OR_DEPARTMENT_OTHER): Payer: Medicare Other | Admitting: Oncology

## 2013-04-12 ENCOUNTER — Encounter: Payer: Self-pay | Admitting: Radiation Oncology

## 2013-04-12 VITALS — BP 110/67 | HR 103 | Temp 97.2°F | Resp 18 | Ht 65.0 in | Wt 153.8 lb

## 2013-04-12 VITALS — BP 110/63 | HR 102 | Temp 97.6°F | Resp 20 | Ht 65.0 in | Wt 154.2 lb

## 2013-04-12 DIAGNOSIS — C3400 Malignant neoplasm of unspecified main bronchus: Secondary | ICD-10-CM

## 2013-04-12 DIAGNOSIS — D649 Anemia, unspecified: Secondary | ICD-10-CM

## 2013-04-12 DIAGNOSIS — C182 Malignant neoplasm of ascending colon: Secondary | ICD-10-CM

## 2013-04-12 DIAGNOSIS — C7951 Secondary malignant neoplasm of bone: Secondary | ICD-10-CM

## 2013-04-12 NOTE — Progress Notes (Signed)
   [facility]    OFFICE PROGRESS NOTE   INTERVAL HISTORY:   He returns as scheduled. The mouth soreness has resolved. No pain. Stable exertional dyspnea. He wears oxygen at night. He is followed by the hospice nurse on a weekly schedule.  Objective:  Vital signs in last 24 hours:  Blood pressure 110/67, pulse 103, temperature 97.2 F (36.2 C), temperature source Oral, resp. rate 18, height 5\' 5"  (1.651 m), weight 153 lb 12.8 oz (69.763 kg).    HEENT: Firm mass at the angle of the right jaw and lower parotid area. Oropharynx without thrush or ulcers Lymphatics: Firm matted nodes in the right greater than left lower neck. No axillary nodes. Resp: Distant breath sounds, moves air bilaterally, no respiratory distress Cardio: Regular rate and rhythm GI: No hepatomegaly Vascular: No leg edema   Medications: I have reviewed the patient's current medications.  Assessment/Plan: 1.Non-small cell lung cancer-obstructing left endobronchial lesion with chest lymphadenopathy, status post a bronchoscopy/EBUS 11/20/2012 with the pathology confirming adenocarcinoma of lung primary , ALK negative  -Staging PET scan 12/05/2012 consistent with metastatic neck/chest adenopathy, retroperitoneal adenopathy, and bone metastases  -Status post palliative radiation to the left chest completed 12/15/2012  2. Respiratory failure following the bronchoscopy procedure 11/20/2012, now maintained on supplemental oxygen and bronchodilator therapy -admitted with acute respiratory failure 12/26/2012-improved following a steroid taper  3. Stage II colon cancer April 2014  4. Abdominal aortic aneurysm-planned percutaneous graft repair has been placed on hold  5. Diabetes  6. Anemia  7. Markedly elevated CEA-most likely secondary to the non-small cell lung cancer  8. Pain/mass at the right mandible-status post palliative radiation completed 03/15/2013  9. Oral Mucositis secondary to radiation  -resolved  Disposition:  Alan [name] appears stable. He will continue weekly followup with the [facility] program. Mr. Duplantis will return for an office visit in one month. I recommended the influenza vaccine. He will discuss this further with his family.   Thornton Papas, MD  04/12/2013  1:37 PM

## 2013-04-12 NOTE — Telephone Encounter (Signed)
gv and printed appt sched and avs for pt for OCT. °

## 2013-04-12 NOTE — Progress Notes (Signed)
Alan [name] here for assessment s/p radiation for Mets to his mandible.  He denies any pain in his mouth or throat  He reports that he is eating without any difficulty.  He has gained 4 lbs since 9/5 14.

## 2013-04-13 NOTE — Progress Notes (Signed)
Radiation Oncology         [phone] ________________________________  Name: Alan [name] MRN: [id number]  Date: 04/12/2013  DOB: December 17, 1931  Follow-Up Visit Note  CC: Redmond Baseman, MD  Ladene Artist, MD  Diagnosis:   Metastatic lung cancer  Interval Since Last Radiation:  One month   Narrative:  The patient returns today for routine follow-up.  The patient completed palliative radiotherapy to the right mandibular region on 03/15/2013. The patient had a nice clinical response. He denies any significant pain in this area currently. The swelling has markedly decreased and the irritation within the mouth has resolved.                              ALLERGIES:  is allergic to sulfa antibiotics; xyzal; pseudoephedrine; and amoxicillin.  Meds: Current Outpatient Prescriptions  Medication Sig Dispense Refill  . albuterol (PROVENTIL HFA;VENTOLIN HFA) 108 (90 BASE) MCG/ACT inhaler Inhale 2 puffs into the lungs every 6 (six) hours as needed for wheezing.      Marland Kitchen albuterol (PROVENTIL) (2.5 MG/3ML) 0.083% nebulizer solution Take 2.5 mg by nebulization every 6 (six) hours as needed for wheezing or shortness of breath.       . Alum & Mag Hydroxide-Simeth (MAGIC MOUTHWASH W/LIDOCAINE) SOLN Take 5 mLs by mouth 4 (four) times daily as needed.  250 mL  1  . emollient (BIAFINE) cream Apply topically as needed.      . fluticasone (FLONASE) 50 MCG/ACT nasal spray Place 2 sprays into the nose daily.  16 g  5  . HYDROcodone-acetaminophen (NORCO/VICODIN) 5-325 MG per tablet Take 1-2 tablets by mouth every 6 (six) hours as needed for pain.  50 tablet  1  . lisinopril (PRINIVIL,ZESTRIL) 5 MG tablet Take 1 tablet (5 mg total) by mouth every morning.  30 tablet  5  . loratadine (CLARITIN) 10 MG tablet Take 10 mg by mouth daily as needed for allergies.       . metFORMIN (GLUCOPHAGE) 500 MG tablet Take 1 tablet (500 mg total) by mouth 2 (two) times daily with a meal.  60 tablet  5  . SPIRIVA  HANDIHALER 18 MCG inhalation capsule Place 18 mcg into inhaler and inhale daily.       . SYMBICORT 160-4.5 MCG/ACT inhaler Inhale 2 puffs into the lungs 2 (two) times daily.       . tamsulosin (FLOMAX) 0.4 MG CAPS capsule Take 1 capsule (0.4 mg total) by mouth daily.  90 capsule  5   No current facility-administered medications for this encounter.    Physical Findings: The patient is in no acute distress. Patient is alert and oriented.  height is 5\' 5"  (1.651 m) and weight is 154 lb 3.2 oz (69.945 kg). His temperature is 97.6 F (36.4 C). His blood pressure is 110/63 and his pulse is 102. His respiration is 20 and oxygen saturation is 96%. .   Some residual fullness in the right mandibular region. Slight radiation change/erythema in this area. The skin has healed well. Mucositis has resolved.  Lab Findings: Lab Results  Component Value Date   WBC 7.2 02/15/2013   HGB 10.4* 02/15/2013   HCT 32.0* 02/15/2013   MCV 90.1 02/15/2013   PLT 233 02/15/2013     Radiographic Findings: No results found.  Impression:    The patient did very well with his palliative course of radiotherapy. He is followed by hospice on a weekly basis.  He saw Dr. Truett Perna and medical oncology earlier today with no current plans for additional systemic treatment.  Plan:  The patient will followup in our clinic on a when necessary basis. I would be more than happy to see the patient in the future if I can be of any further assistance.   Radene Gunning, M.D., Ph.D.

## 2013-04-16 ENCOUNTER — Ambulatory Visit (INDEPENDENT_AMBULATORY_CARE_PROVIDER_SITE_OTHER): Payer: Medicare Other

## 2013-04-16 DIAGNOSIS — Z23 Encounter for immunization: Secondary | ICD-10-CM

## 2013-05-10 ENCOUNTER — Telehealth: Payer: Self-pay | Admitting: Oncology

## 2013-05-10 ENCOUNTER — Ambulatory Visit: Payer: Medicare Other | Admitting: Family Medicine

## 2013-05-10 ENCOUNTER — Ambulatory Visit (HOSPITAL_BASED_OUTPATIENT_CLINIC_OR_DEPARTMENT_OTHER): Payer: Medicare Other | Admitting: Oncology

## 2013-05-10 VITALS — BP 126/66 | HR 104 | Temp 97.1°F | Resp 18 | Ht 65.0 in | Wt 158.9 lb

## 2013-05-10 DIAGNOSIS — R970 Elevated carcinoembryonic antigen [CEA]: Secondary | ICD-10-CM

## 2013-05-10 DIAGNOSIS — C3400 Malignant neoplasm of unspecified main bronchus: Secondary | ICD-10-CM

## 2013-05-10 DIAGNOSIS — C7951 Secondary malignant neoplasm of bone: Secondary | ICD-10-CM

## 2013-05-10 DIAGNOSIS — D649 Anemia, unspecified: Secondary | ICD-10-CM

## 2013-05-10 DIAGNOSIS — C182 Malignant neoplasm of ascending colon: Secondary | ICD-10-CM

## 2013-05-10 NOTE — Telephone Encounter (Signed)
gv and printed appt sched and avs for pt for DEc °

## 2013-05-10 NOTE — Progress Notes (Signed)
   [facility]    OFFICE PROGRESS NOTE   INTERVAL HISTORY:   He returns for a scheduled visit. Mr. Chiasson reports stable exertional dyspnea. He has a cough productive of a clear sputum. He relates this to sinus drainage. No pain. Good appetite. He reports being able to go to sleep, but he wakes up during the night. The hospice nurse is visiting weekly.  Objective:  Vital signs in last 24 hours:  Blood pressure 126/66, pulse 104, temperature 97.1 F (36.2 C), temperature source Oral, resp. rate 18, height 5\' 5"  (1.651 m), weight 158 lb 14.4 oz (72.077 kg), SpO2 96.00%.    HEENT: No thrush, fullness at the right lower parotid/mandible without a discrete mass. Lymphatics: Large matted lymph nodes in the bilateral lower neck Resp: Distant breath sounds, no respiratory distress Cardio: Regular rate and rhythm GI: No hepatomegaly Vascular: No leg edema    Medications: I have reviewed the patient's current medications.  Assessment/Plan: 1.Non-small cell lung cancer-obstructing left endobronchial lesion with chest lymphadenopathy, status post a bronchoscopy/EBUS 11/20/2012 with the pathology confirming adenocarcinoma of lung primary , ALK negative , EGFR mutation negative -Staging PET scan 12/05/2012 consistent with metastatic neck/chest adenopathy, retroperitoneal adenopathy, and bone metastases  -Status post palliative radiation to the left chest completed 12/15/2012  2. Respiratory failure following the bronchoscopy procedure 11/20/2012, now maintained on supplemental oxygen and bronchodilator therapy -admitted with acute respiratory failure 12/26/2012-improved following a steroid taper  3. Stage II colon cancer April 2014  4. Abdominal aortic aneurysm-planned percutaneous graft repair has been placed on hold  5. Diabetes  6. Anemia  7. Markedly elevated CEA-most likely secondary to the non-small cell lung cancer  8. Pain/mass at the right mandible-status post  palliative radiation completed 03/15/2013 , improved 9. Oral Mucositis secondary to radiation -resolved    Disposition:  He appears unchanged. He will continue weekly followup with the [facility] program. Mr. Seelbach will see his dentist to consider extraction of the single remaining lower tooth so he can get lower dentures. He will return for an office visit in approximately one month.   Thornton Papas, MD  05/10/2013  11:37 AM

## 2013-06-05 ENCOUNTER — Ambulatory Visit (INDEPENDENT_AMBULATORY_CARE_PROVIDER_SITE_OTHER): Payer: Medicare Other | Admitting: Family Medicine

## 2013-06-05 ENCOUNTER — Encounter: Payer: Self-pay | Admitting: Family Medicine

## 2013-06-05 VITALS — BP 114/76 | HR 112 | Temp 97.6°F | Ht 65.0 in | Wt 158.8 lb

## 2013-06-05 DIAGNOSIS — E1110 Type 2 diabetes mellitus with ketoacidosis without coma: Secondary | ICD-10-CM

## 2013-06-05 DIAGNOSIS — C189 Malignant neoplasm of colon, unspecified: Secondary | ICD-10-CM

## 2013-06-05 DIAGNOSIS — C7951 Secondary malignant neoplasm of bone: Secondary | ICD-10-CM

## 2013-06-05 DIAGNOSIS — E1310 Other specified diabetes mellitus with ketoacidosis without coma: Secondary | ICD-10-CM

## 2013-06-05 DIAGNOSIS — C3400 Malignant neoplasm of unspecified main bronchus: Secondary | ICD-10-CM

## 2013-06-05 DIAGNOSIS — C801 Malignant (primary) neoplasm, unspecified: Secondary | ICD-10-CM

## 2013-06-05 DIAGNOSIS — I10 Essential (primary) hypertension: Secondary | ICD-10-CM

## 2013-06-05 DIAGNOSIS — J449 Chronic obstructive pulmonary disease, unspecified: Secondary | ICD-10-CM

## 2013-06-05 LAB — POCT GLYCOSYLATED HEMOGLOBIN (HGB A1C): Hemoglobin A1C: 5.5

## 2013-06-05 NOTE — Progress Notes (Addendum)
Patient ID: Alan [name], male   DOB: 10-Aug-1931, 79 y.o.   MRN: [id number] SUBJECTIVE: CC: Chief Complaint  Patient presents with  . Follow-up    2 month follow up chronic problems     HPI: Doing fairly well considering that he has gone through lung and colon cancer. He has boney mets and now right occipital and cervical node involvement. No pain. Came for follow up. He and his wife has been grateful for all the care he has received. He is doing well. ennd of life issues and documentation has been done through the cancer center.  Past Medical History  Diagnosis Date  . AAA (abdominal aortic aneurysm)   . Bronchitis   . Shortness of breath     with exertion   . Sleep apnea     sleeps with oxygen at 2.5 L/[place]   . Acute sinusitis     10/03/12   . History of kidney stones     many yrs ago  . History of radiation therapy 11/24/2012-11/30/2012    9 gray to left lung/mediastinum  . Hypertension     borderline   . DM (diabetes mellitus) type 2 09/18/2012  . COPD (chronic obstructive pulmonary disease)   . colon ca dx'd 10/2012    colon cancer s/p surgery 4'14, skin cancer  . 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 Metastasis to bone     right mandible mass  . Anemia requiring transfusions 11-16-12    last 10-18-12 s/p Partial colectomy  . History of radiation therapy 03/01/13-03/15/13    right posterior mandibular   Past Surgical History  Procedure Laterality Date  . Scrotum exploration    . Removal of abcess  2006    pt. states abcess was located behind his tonsils  . Laparoscopic partial colectomy N/A 10/12/2012    Procedure: LAPAROSCOPIC PARTIAL COLECTOMY;  Surgeon: Adolph Pollack, MD;  Location: [facility] ORS;  Service: General;  Laterality: N/A;  . Colon surgery    . Abscess removed from throat    . Cataract extraction, bilateral    . Endobronchial ultrasound Bilateral 11/20/2012    Procedure: ENDOBRONCHIAL ULTRASOUND;  Surgeon: Leslye Peer,  MD;  Location: [facility] ENDOSCOPY;  Service: Cardiopulmonary;  Laterality: Bilateral;   History   Social History  . Marital Status: Married    Spouse Name: N/A    Number of Children: 2  . Years of Education: N/A   Occupational History  . retired     Production manager   Social History Main Topics  . Smoking status: Former Smoker -- 1.50 packs/day for 60 years    Types: Cigarettes    Quit date: 07/12/2001  . Smokeless tobacco: Former Neurosurgeon    Types: Chew    Quit date: 11/16/2001  . Alcohol Use: No  . Drug Use: No  . Sexual Activity: No   Other Topics Concern  . Not on file   Social History Narrative   Married to wife, Lanora Manis   Retired from Insurance account manager   Independent in ADL's   Family History  Problem Relation Age of Onset  . Heart disease Father   . Heart disease Mother    Current Outpatient Prescriptions on File Prior to Visit  Medication Sig Dispense Refill  . albuterol (PROVENTIL HFA;VENTOLIN HFA) 108 (90 BASE) MCG/ACT inhaler Inhale 2 puffs into the lungs every 6 (six) hours as needed for wheezing.      Marland Kitchen  albuterol (PROVENTIL) (2.5 MG/3ML) 0.083% nebulizer solution Take 2.5 mg by nebulization every 6 (six) hours as needed for wheezing or shortness of breath.       . emollient (BIAFINE) cream Apply topically as needed.      . fluticasone (FLONASE) 50 MCG/ACT nasal spray Place 2 sprays into the nose daily.  16 g  5  . HYDROcodone-acetaminophen (NORCO/VICODIN) 5-325 MG per tablet Take 1-2 tablets by mouth every 6 (six) hours as needed for pain.  50 tablet  1  . lisinopril (PRINIVIL,ZESTRIL) 5 MG tablet Take 1 tablet (5 mg total) by mouth every morning.  30 tablet  5  . loratadine (CLARITIN) 10 MG tablet Take 10 mg by mouth daily as needed for allergies.       Marland Kitchen LORazepam (ATIVAN PO) Take by mouth as needed. Per Hospice      . metFORMIN (GLUCOPHAGE) 500 MG tablet Take 1 tablet (500 mg total) by mouth 2 (two) times daily with a meal.  60 tablet   5  . SPIRIVA HANDIHALER 18 MCG inhalation capsule Place 18 mcg into inhaler and inhale daily.       . SYMBICORT 160-4.5 MCG/ACT inhaler Inhale 2 puffs into the lungs 2 (two) times daily.       . tamsulosin (FLOMAX) 0.4 MG CAPS capsule Take 1 capsule (0.4 mg total) by mouth daily.  90 capsule  5   No current facility-administered medications on file prior to visit.   Allergies  Allergen Reactions  . Sulfa Antibiotics Anaphylaxis  . Xyzal [Levocetirizine] Shortness Of Breath  . Pseudoephedrine     stops pt from urinating  . Amoxicillin Rash   Immunization History  Administered Date(s) Administered  . Influenza,inj,Quad PF,36+ Mos 04/16/2013  . Pneumococcal Polysaccharide-23 07/12/2010   Prior to Admission medications   Medication Sig Start Date End Date Taking? Authorizing Provider  albuterol (PROVENTIL HFA;VENTOLIN HFA) 108 (90 BASE) MCG/ACT inhaler Inhale 2 puffs into the lungs every 6 (six) hours as needed for wheezing.   Yes Historical Provider, MD  albuterol (PROVENTIL) (2.5 MG/3ML) 0.083% nebulizer solution Take 2.5 mg by nebulization every 6 (six) hours as needed for wheezing or shortness of breath.  08/04/12  Yes Historical Provider, MD  emollient (BIAFINE) cream Apply topically as needed.   Yes Historical Provider, MD  fluticasone (FLONASE) 50 MCG/ACT nasal spray Place 2 sprays into the nose daily. 03/02/13  Yes Ileana Ladd, MD  HYDROcodone-acetaminophen (NORCO/VICODIN) 5-325 MG per tablet Take 1-2 tablets by mouth every 6 (six) hours as needed for pain. 02/15/13  Yes Ladene Artist, MD  lisinopril (PRINIVIL,ZESTRIL) 5 MG tablet Take 1 tablet (5 mg total) by mouth every morning. 03/02/13  Yes Ileana Ladd, MD  loratadine (CLARITIN) 10 MG tablet Take 10 mg by mouth daily as needed for allergies.    Yes Historical Provider, MD  LORazepam (ATIVAN PO) Take by mouth as needed. Per Hospice   Yes Historical Provider, MD  metFORMIN (GLUCOPHAGE) 500 MG tablet Take 1 tablet (500 mg total)  by mouth 2 (two) times daily with a meal. 03/02/13  Yes Ileana Ladd, MD  SPIRIVA HANDIHALER 18 MCG inhalation capsule Place 18 mcg into inhaler and inhale daily.  08/22/12  Yes Historical Provider, MD  SYMBICORT 160-4.5 MCG/ACT inhaler Inhale 2 puffs into the lungs 2 (two) times daily.  08/28/12  Yes Historical Provider, MD  tamsulosin (FLOMAX) 0.4 MG CAPS capsule Take 1 capsule (0.4 mg total) by mouth daily. 03/02/13  Yes Thelma Barge  Casimiro Needle, MD     ROS: As above in the HPI. All other systems are stable or negative.  OBJECTIVE: APPEARANCE:  Patient in no acute distress.The patient appeared well nourished and normally developed. Acyanotic. Waist: VITAL SIGNS:BP 114/76  Pulse 112  Temp(Src) 97.6 F (36.4 C) (Oral)  Ht 5\' 5"  (1.651 m)  Wt 158 lb 12.8 oz (72.031 kg)  BMI 26.43 kg/m2 WM  SKIN: warm and  Dry without overt rashes, tattoos and scars  HEAD and Neck: without JVD, Head and scalp: normal Eyes:No scleral icterus. Fundi normal, eye movements normal. Ears: Auricle normal, canal normal, Tympanic membranes normal, insufflation normal. Nose: normal Throat: normal Neck & thyroid: right side of neck posteriorly has enlarged hard nodes.  CHEST & LUNGS: Chest wall: normal Lungs: Coarse bs.  CVS: Reveals the PMI to be normally located. Regular rhythm, First and Second Heart sounds are normal,  absence of  rubs or gallops. 2/6 systolic murmur. Peripheral vasculature: Radial pulses: normal Dorsal pedis pulses: normal Posterior pulses: normal  ABDOMEN:  Appearance: normal Benign, no organomegaly, no masses, no Abdominal Aortic enlargement. No Guarding , no rebound. No Bruits. Bowel sounds: normal  RECTAL: N/A GU: N/A  EXTREMETIES: nonedematous.  MUSCULOSKELETAL:  Spine: normal Joints: intact  NEUROLOGIC: oriented to time,place and person; nonfocal. Strength is normal Sensory is normal Reflexes are normal Cranial Nerves are normal.  Results for orders placed in  visit on 06/05/13  POCT GLYCOSYLATED HEMOGLOBIN (HGB A1C)      Result Value Range   Hemoglobin A1C 5.5%      ASSESSMENT:  Colon cancer, right T2N0 - Plan: CMP14+EGFR  DM (diabetes mellitus) type 2 - Plan: POCT glycosylated hemoglobin (Hb A1C), CMP14+EGFR  Hypertension - Plan: CMP14+EGFR  Lung cancer, main bronchus, unspecified laterality - Plan: CMP14+EGFR  HTN (hypertension)  COPD (chronic obstructive pulmonary disease)  Bone metastasis  PLAN:  Orders Placed This Encounter  Procedures  . CMP14+EGFR  . POCT glycosylated hemoglobin (Hb A1C)   Continue same medications and follow up with specialists. Agree that he may need palliative Rad therapy to the neck.   No orders of the defined types were placed in this encounter.   There are no discontinued medications. Return in about 2 months (around 08/05/2013) for Recheck medical problems.  Leanard Dimaio P. [place] Charon, M.D.

## 2013-06-06 LAB — CMP14+EGFR
ALT: 6 IU/L (ref 0–44)
AST: 11 IU/L (ref 0–40)
Albumin/Globulin Ratio: 1.1 (ref 1.1–2.5)
Albumin: 3.8 g/dL (ref 3.5–4.8)
Alkaline Phosphatase: 61 IU/L (ref 39–117)
BUN/Creatinine Ratio: 20 (ref 10–22)
BUN: 12 mg/dL (ref 8–27)
CO2: 27 mmol/L (ref 18–29)
Calcium: 9.2 mg/dL (ref 8.6–10.2)
Chloride: 97 mmol/L (ref 97–108)
Creatinine, Ser: 0.61 mg/dL — ABNORMAL LOW (ref 0.76–1.27)
GFR calc Af Amer: 110 mL/min/{1.73_m2} (ref >59)
GFR calc non Af Amer: 95 mL/min/{1.73_m2} (ref >59)
Globulin, Total: 3.4 g/dL (ref 1.5–4.5)
Glucose: 100 mg/dL — ABNORMAL HIGH (ref 65–99)
Potassium: 4.8 mmol/L (ref 3.5–5.2)
Sodium: 137 mmol/L (ref 134–144)
Total Bilirubin: 0.4 mg/dL (ref 0.0–1.2)
Total Protein: 7.2 g/dL (ref 6.0–8.5)

## 2013-06-08 ENCOUNTER — Other Ambulatory Visit: Payer: Self-pay | Admitting: *Deleted

## 2013-06-08 ENCOUNTER — Telehealth: Payer: Self-pay | Admitting: *Deleted

## 2013-06-08 DIAGNOSIS — C7951 Secondary malignant neoplasm of bone: Secondary | ICD-10-CM

## 2013-06-08 DIAGNOSIS — C189 Malignant neoplasm of colon, unspecified: Secondary | ICD-10-CM

## 2013-06-08 DIAGNOSIS — C3400 Malignant neoplasm of unspecified main bronchus: Secondary | ICD-10-CM

## 2013-06-08 NOTE — Telephone Encounter (Signed)
Reports lymph nodes in neck are larger and beginning to "bother" him. Has not interfered with ability to swallow or his breathing. Wanted MD aware in case he could arrange for patient to see Dr. Mitzi Hansen on 12/4 when they are here seeing Dr. Truett Perna.

## 2013-06-08 NOTE — Telephone Encounter (Signed)
Per Dr. Truett Perna : OK to set up appointment to see Dr. Mitzi Hansen on 06/14/13 if possible to discuss potential palliative radiation to neck nodes. Orders placed in EPIC>

## 2013-06-11 ENCOUNTER — Other Ambulatory Visit: Payer: Self-pay | Admitting: *Deleted

## 2013-06-11 ENCOUNTER — Telehealth: Payer: Self-pay | Admitting: Oncology

## 2013-06-11 DIAGNOSIS — C3400 Malignant neoplasm of unspecified main bronchus: Secondary | ICD-10-CM

## 2013-06-11 NOTE — Telephone Encounter (Signed)
S/W Alan [name] FROM RAD ONC AND SHE IS AWARE OF THE REFERRAL IN THE SYSTEM FOR THE PT'S APPT WITH DR MOODY. PER Alan [name] SHE WILL SCHEDULE THE APPT

## 2013-06-13 ENCOUNTER — Encounter: Payer: Self-pay | Admitting: Radiation Oncology

## 2013-06-13 NOTE — Progress Notes (Signed)
Histology and Location of Primary Cancer: non-small cell lung cancer with enlarged hard nodes on right side of neck posteriorly  Sites of Visceral and Bony Metastatic Disease: metastatic neck/chest adenopathy, retroperitoneal adenopathy, and bone metastases, mass at posterior aspect of the right mandible . Currently w/enlarged hard nodes on right side of neck posteriorly.  Location(s) of Symptomatic Metastases: right side of neck posteriorly.  Past/Anticipated chemotherapy by medical oncology, if any: none. Saw Dr. [place] Charon on 06/05/13  Pain on a scale of 0-10 is: 2 right side neck and generalized aching  Ambulatory status? Walker? Wheelchair?: ambulatory  SAFETY ISSUES:  Prior radiation? 11/24/2012-11/30/2012 9 gray to left lung/mediastinum , 03/01/13 - 03/15/13 palliative treatment to right posterior mandibular region, 10 fractions Pacemaker/ICD? no   Is the patient on methotrexate? No  Current Complaints / other details: under Hospice care Right neck swelling, hurts at times stated

## 2013-06-14 ENCOUNTER — Ambulatory Visit (HOSPITAL_BASED_OUTPATIENT_CLINIC_OR_DEPARTMENT_OTHER): Payer: Medicare Other | Admitting: Oncology

## 2013-06-14 ENCOUNTER — Telehealth: Payer: Self-pay | Admitting: Oncology

## 2013-06-14 ENCOUNTER — Encounter: Payer: Self-pay | Admitting: Radiation Oncology

## 2013-06-14 ENCOUNTER — Ambulatory Visit
Admission: RE | Admit: 2013-06-14 | Discharge: 2013-06-14 | Disposition: A | Discharge status: Home / Self Care | Source: Ambulatory Visit | Attending: Radiation Oncology | Admitting: Radiation Oncology

## 2013-06-14 VITALS — BP 136/53 | HR 100 | Temp 97.8°F | Resp 20 | Wt 155.4 lb

## 2013-06-14 VITALS — BP 111/54 | HR 111 | Temp 98.0°F | Resp 20 | Ht 65.0 in | Wt 156.1 lb

## 2013-06-14 DIAGNOSIS — C3400 Malignant neoplasm of unspecified main bronchus: Secondary | ICD-10-CM

## 2013-06-14 DIAGNOSIS — C770 Secondary and unspecified malignant neoplasm of lymph nodes of head, face and neck: Secondary | ICD-10-CM

## 2013-06-14 DIAGNOSIS — R599 Enlarged lymph nodes, unspecified: Secondary | ICD-10-CM

## 2013-06-14 DIAGNOSIS — C3490 Malignant neoplasm of unspecified part of unspecified bronchus or lung: Secondary | ICD-10-CM | POA: Insufficient documentation

## 2013-06-14 DIAGNOSIS — C801 Malignant (primary) neoplasm, unspecified: Secondary | ICD-10-CM

## 2013-06-14 DIAGNOSIS — R970 Elevated carcinoembryonic antigen [CEA]: Secondary | ICD-10-CM

## 2013-06-14 DIAGNOSIS — C50919 Malignant neoplasm of unspecified site of unspecified female breast: Secondary | ICD-10-CM

## 2013-06-14 DIAGNOSIS — C189 Malignant neoplasm of colon, unspecified: Secondary | ICD-10-CM

## 2013-06-14 DIAGNOSIS — D649 Anemia, unspecified: Secondary | ICD-10-CM

## 2013-06-14 DIAGNOSIS — C773 Secondary and unspecified malignant neoplasm of axilla and upper limb lymph nodes: Secondary | ICD-10-CM

## 2013-06-14 HISTORY — DX: Personal history of irradiation: Z92.3

## 2013-06-14 HISTORY — DX: Malignant neoplasm of unspecified part of unspecified bronchus or lung: C34.90

## 2013-06-14 NOTE — Telephone Encounter (Signed)
Gave pt appt for MD [facility] 2015

## 2013-06-14 NOTE — Progress Notes (Signed)
Please see the Nurse Progress Note in the MD Initial Consult Encounter for this patient. 

## 2013-06-14 NOTE — Progress Notes (Signed)
   [facility]    OFFICE PROGRESS NOTE   INTERVAL HISTORY:   He returns for scheduled followup of metastatic non-small cell lung cancer. The hospice nurse continues to visit weekly. He has noted increased bilateral neck adenopathy. There is mild associated pain. He saw Dr. Mitzi Hansen earlier today and is being scheduled for palliative radiation. No change in dyspnea. His family reports his appetite has diminished.  Objective:  Vital signs in last 24 hours:  Blood pressure 111/54, pulse 111, temperature 98 F (36.7 C), temperature source Oral, resp. rate 20, height 5\' 5"  (1.651 m), weight 156 lb 1.6 oz (70.806 kg).    HEENT: Oral cavity without visible mass. Mild fullness at the right cheek without a discrete mass. Lymphatics: Firm matted nodes in the low neck bilaterally. No axillary nodes. Resp: Distant breath sounds, decreased breath sounds over the left chest, no respiratory distress Cardio: Regular rate and rhythm GI: No hepatomegaly Vascular: The right lower leg is slightly larger than left side with trace edema at the ankle    Medications: I have reviewed the patient's current medications.  Assessment/Plan: 1.Non-small cell lung cancer-obstructing left endobronchial lesion with chest lymphadenopathy, status post a bronchoscopy/EBUS 11/20/2012 with the pathology confirming adenocarcinoma of lung primary , ALK negative , EGFR mutation negative  -Staging PET scan 12/05/2012 consistent with metastatic neck/chest adenopathy, retroperitoneal adenopathy, and bone metastases  -Status post palliative radiation to the left chest completed 12/15/2012  2. Respiratory failure following the bronchoscopy procedure 11/20/2012, now maintained on supplemental oxygen and bronchodilator therapy -admitted with acute respiratory failure 12/26/2012-improved following a steroid taper  3. Stage II colon cancer April 2014  4. Abdominal aortic aneurysm-planned percutaneous graft repair has  been placed on hold  5. Diabetes  6. Anemia  7. Markedly elevated CEA-most likely secondary to the non-small cell lung cancer  8. Pain/mass at the right mandible-status post palliative radiation completed 03/15/2013 , improved  9. Oral Mucositis secondary to radiation -resolved 10. Progressive bilateral neck lymphadenopathy-now painful. He will begin a course of palliative radiation   Disposition:  The neck lymph nodes have increased in size and are now bothering him. He saw Dr. Mitzi Hansen earlier today and reports she will be scheduled for palliative radiation. Mr. Muniz will return for an office visit 07/20/2012. He will continue followup with the hospice program. He continues to qualify for hospice services.   Thornton Papas, MD  06/14/2013  1:49 PM

## 2013-06-15 ENCOUNTER — Telehealth: Payer: Self-pay | Admitting: *Deleted

## 2013-06-15 DIAGNOSIS — C770 Secondary and unspecified malignant neoplasm of lymph nodes of head, face and neck: Secondary | ICD-10-CM | POA: Insufficient documentation

## 2013-06-15 NOTE — Progress Notes (Signed)
Radiation Oncology         (336) [phone] ________________________________  Name: Alan [name] MRN: [id number]  Date: 06/14/2013  DOB: 1931/08/21  Follow-Up Visit Note  CC: Redmond Baseman, MD  Ladene Artist, MD  Diagnosis:   Metastatic lung cancer  Narrative:  The patient returns today for reevaluation today. He previously was treated to the jaw for a metastasis which was painful. He had a nice response. The patient has been found to have enlarging cervical lymph nodes posteriorly. He has begun to experience some pain associated with this on the right where the lymph nodes are larger than on the contralateral side. I therefore been asked to see the patient today for consideration of palliative radiation treatment to the neck. Otherwise, the patient feels that he is doing quite well.                            ALLERGIES:  is allergic to sulfa antibiotics; xyzal; pseudoephedrine; and amoxicillin.  Meds: Current Outpatient Prescriptions  Medication Sig Dispense Refill  . albuterol (PROVENTIL HFA;VENTOLIN HFA) 108 (90 BASE) MCG/ACT inhaler Inhale 2 puffs into the lungs every 6 (six) hours as needed for wheezing.      Marland Kitchen albuterol (PROVENTIL) (2.5 MG/3ML) 0.083% nebulizer solution Take 2.5 mg by nebulization every 6 (six) hours as needed for wheezing or shortness of breath.       . fluticasone (FLONASE) 50 MCG/ACT nasal spray Place 2 sprays into the nose daily.  16 g  5  . lisinopril (PRINIVIL,ZESTRIL) 5 MG tablet Take 1 tablet (5 mg total) by mouth every morning.  30 tablet  5  . loratadine (CLARITIN) 10 MG tablet Take 10 mg by mouth daily as needed for allergies.       . metFORMIN (GLUCOPHAGE) 500 MG tablet Take 500 mg by mouth daily with breakfast.      . SPIRIVA HANDIHALER 18 MCG inhalation capsule Place 18 mcg into inhaler and inhale daily.       . SYMBICORT 160-4.5 MCG/ACT inhaler Inhale 2 puffs into the lungs 2 (two) times daily.       . tamsulosin (FLOMAX) 0.4 MG CAPS  capsule Take 1 capsule (0.4 mg total) by mouth daily.  90 capsule  5  . emollient (BIAFINE) cream Apply topically as needed.      Marland Kitchen HYDROcodone-acetaminophen (NORCO/VICODIN) 5-325 MG per tablet Take 1-2 tablets by mouth every 6 (six) hours as needed for pain.  50 tablet  1  . LORazepam (ATIVAN PO) Take by mouth as needed. Per Hospice       No current facility-administered medications for this encounter.    Physical Findings: The patient is in no acute distress. Patient is alert and oriented.  weight is 155 lb 6.4 oz (70.489 kg). His oral temperature is 97.8 F (36.6 C). His blood pressure is 136/53 and his pulse is 100. His respiration is 20 and oxygen saturation is 99%. .   The patient has firm fairly bulky lymph nodes bilaterally greater on the right. These were largely nontender. Extension into the supraclavicular region inferiorly.  Lab Findings: Lab Results  Component Value Date   WBC 7.2 02/15/2013   HGB 10.4* 02/15/2013   HCT 32.0* 02/15/2013   MCV 90.1 02/15/2013   PLT 233 02/15/2013     Radiographic Findings: No results found.  Impression:    The patient is an appropriate candidate for palliative radiotherapy I believe to the lymphadenopathy  within the neck. I would treat the right neck for symptom relief but would also treat the left neck simultaneously as these lymph nodes also appear quite enlarged. I'm afraid if we don't go ahead and treat them that he will begin experiencing some discomfort on this side as well in the near future.  I discussed with the patient therefore and potential two-week course of treatment. I do not believe that his prior treatment will limit [place] but we will evaluate this further during the treatment planning process. I discussed the potential benefit of such a treatment as well as the possible side effects and risks. All of his questions were answered.   Plan:  The patient will proceed with a simulation as soon as this can be scheduled for treatment  planning.    Radene Gunning, M.D., Ph.D.

## 2013-06-15 NOTE — Telephone Encounter (Signed)
Message from [place] nurse with [facility]. She stated pt's palliative radiation will not be covered under hospice. It is considered "aggressive treatment." They had pt revoke hospice so his insurance will cover radiation. They will need a new order for hospice after pt completes treatment.

## 2013-06-15 NOTE — Addendum Note (Signed)
Encounter addended by: Jonna Coup, MD on: 06/15/2013  4:39 PM<BR>     Documentation filed: Notes Section, Problem List, Follow-up Section, LOS Section

## 2013-06-18 ENCOUNTER — Ambulatory Visit
Admission: RE | Admit: 2013-06-18 | Discharge: 2013-06-18 | Disposition: A | Discharge status: Home / Self Care | Payer: Medicare Other | Source: Ambulatory Visit | Attending: Radiation Oncology | Admitting: Radiation Oncology

## 2013-06-18 DIAGNOSIS — R05 Cough: Secondary | ICD-10-CM | POA: Insufficient documentation

## 2013-06-18 DIAGNOSIS — L539 Erythematous condition, unspecified: Secondary | ICD-10-CM | POA: Insufficient documentation

## 2013-06-18 DIAGNOSIS — Z510 Encounter for antineoplastic radiation therapy: Secondary | ICD-10-CM | POA: Insufficient documentation

## 2013-06-18 DIAGNOSIS — C3490 Malignant neoplasm of unspecified part of unspecified bronchus or lung: Secondary | ICD-10-CM | POA: Insufficient documentation

## 2013-06-18 DIAGNOSIS — C770 Secondary and unspecified malignant neoplasm of lymph nodes of head, face and neck: Secondary | ICD-10-CM

## 2013-06-18 DIAGNOSIS — R059 Cough, unspecified: Secondary | ICD-10-CM | POA: Insufficient documentation

## 2013-06-18 DIAGNOSIS — R6889 Other general symptoms and signs: Secondary | ICD-10-CM | POA: Insufficient documentation

## 2013-06-18 DIAGNOSIS — R220 Localized swelling, mass and lump, head: Secondary | ICD-10-CM | POA: Insufficient documentation

## 2013-06-19 ENCOUNTER — Telehealth: Payer: Self-pay | Admitting: Family Medicine

## 2013-06-19 MED ORDER — TIOTROPIUM BROMIDE MONOHYDRATE 18 MCG IN CAPS: 18.0000 ug | ORAL_CAPSULE | Freq: Every day | RESPIRATORY_TRACT | Status: DC

## 2013-06-19 NOTE — Telephone Encounter (Signed)
Wife aware samples of spiriva at front desk

## 2013-06-21 ENCOUNTER — Ambulatory Visit
Admission: RE | Admit: 2013-06-21 | Discharge: 2013-06-21 | Disposition: A | Discharge status: Home / Self Care | Payer: Medicare Other | Source: Ambulatory Visit | Attending: Radiation Oncology | Admitting: Radiation Oncology

## 2013-06-21 DIAGNOSIS — C770 Secondary and unspecified malignant neoplasm of lymph nodes of head, face and neck: Secondary | ICD-10-CM

## 2013-06-22 ENCOUNTER — Ambulatory Visit
Admission: RE | Admit: 2013-06-22 | Discharge: 2013-06-22 | Disposition: A | Discharge status: Home / Self Care | Payer: Medicare Other | Source: Ambulatory Visit | Attending: Radiation Oncology | Admitting: Radiation Oncology

## 2013-06-22 ENCOUNTER — Encounter: Payer: Self-pay | Admitting: Radiation Oncology

## 2013-06-22 VITALS — BP 123/75 | HR 105 | Temp 98.2°F | Resp 22 | Wt 159.7 lb

## 2013-06-22 DIAGNOSIS — C770 Secondary and unspecified malignant neoplasm of lymph nodes of head, face and neck: Secondary | ICD-10-CM

## 2013-06-22 MED ORDER — BIAFINE EX EMUL
CUTANEOUS | Status: DC | PRN
  Administered 2013-06-22: 16:00:00 via TOPICAL

## 2013-06-22 NOTE — Progress Notes (Signed)
  Radiation Oncology         (336) [phone] ________________________________  Name: Alan [name] MRN: [id number]  Date: 06/18/2013  DOB: September 25, 1931  SIMULATION AND TREATMENT PLANNING NOTE  DIAGNOSIS:  Metastatic lung cancer  NARRATIVE:  The patient was brought to the CT Simulation planning suite.  Identity was confirmed.  All relevant records and images related to the planned course of therapy were reviewed.   Written consent to proceed with treatment was confirmed which was freely given after reviewing the details related to the planned course of therapy had been reviewed with the patient.  Then, the patient was set-up in a stable reproducible  supine position for radiation therapy.  CT images were obtained.  Surface markings were placed.  A customized thermoplastic head chest was constructed and this complex treatment device will be used on a daily basis.   The CT images were loaded into the planning software.  Then the target and avoidance structures were contoured.  Treatment planning then occurred.  The radiation prescription was entered and confirmed.  A total of to  complex treatment devices were fabricated which relate to the designed radiation treatment fields. Each of these customized fields/ complex treatment devices will be used on a daily basis during the radiation course. I have requested : Isodose Plan.   PLAN:  The patient will receive 30  Gy in 10  Fractions.  Special treatment procedure The current treatment represents a course of irradiation. The target area overlaps with a prior course of radiation treatment to the right mandible, and the inferior aspect of the treatment area currently overlaps with prior radiation treatment to the chest. These plans will be carefully reviewed to combine each of these treatments into a keloid of plan.  ________________________________   Radene Gunning, MD, PhD

## 2013-06-22 NOTE — Progress Notes (Signed)
Weekly rad tx right neck, 2/10 done, slight erythema, swelling, patient on 2 liters n/c oxygen, runny nose, coughing more, drinks glucerna 2 daily, ate sausage biscuit thei am,occasional cramping feeling in neck stated, patient education reviewed, biafine give, discusses symptoms to report and side effects, teach bacj 4:16 PM

## 2013-06-22 NOTE — Progress Notes (Signed)
  Radiation Oncology         [phone] ________________________________  Name: Alan [name] MRN: [id number]  Date: 06/21/2013  DOB: [birth year]  Simulation Verification Note   NARRATIVE: The patient was brought to the treatment unit and placed in the planned treatment position. The clinical setup was verified. Then port films were obtained and uploaded to the radiation oncology medical record software.  The treatment beams were carefully compared against the planned radiation fields. The position, location, and shape of the radiation fields was reviewed. The targeted volume of tissue appears to be appropriately covered by the radiation beams. Based on my personal review, I approved the simulation verification. The patient's treatment will proceed as planned.  ________________________________   Radene Gunning, MD, PhD

## 2013-06-22 NOTE — Progress Notes (Signed)
Department of Radiation Oncology  Phone:  [phone] Fax:        [phone]  Weekly Treatment Note    Name: Alan [name] Date: 06/22/2013 MRN: [id number] DOB: 1932/06/16   Current dose: 6 Gy  Current fraction: 2   MEDICATIONS: Current Outpatient Prescriptions  Medication Sig Dispense Refill  . albuterol (PROVENTIL HFA;VENTOLIN HFA) 108 (90 BASE) MCG/ACT inhaler Inhale 2 puffs into the lungs every 6 (six) hours as needed for wheezing.      Marland Kitchen albuterol (PROVENTIL) (2.5 MG/3ML) 0.083% nebulizer solution Take 2.5 mg by nebulization every 6 (six) hours as needed for wheezing or shortness of breath.       . emollient (BIAFINE) cream Apply topically as needed.      . fluticasone (FLONASE) 50 MCG/ACT nasal spray Place 2 sprays into the nose daily.  16 g  5  . HYDROcodone-acetaminophen (NORCO/VICODIN) 5-325 MG per tablet Take 1-2 tablets by mouth every 6 (six) hours as needed for pain.  50 tablet  1  . lisinopril (PRINIVIL,ZESTRIL) 5 MG tablet Take 1 tablet (5 mg total) by mouth every morning.  30 tablet  5  . loratadine (CLARITIN) 10 MG tablet Take 10 mg by mouth daily as needed for allergies.       Marland Kitchen LORazepam (ATIVAN PO) Take by mouth as needed. Per Hospice      . metFORMIN (GLUCOPHAGE) 500 MG tablet Take 500 mg by mouth daily with breakfast.      . SYMBICORT 160-4.5 MCG/ACT inhaler Inhale 2 puffs into the lungs 2 (two) times daily.       . tamsulosin (FLOMAX) 0.4 MG CAPS capsule Take 1 capsule (0.4 mg total) by mouth daily.  90 capsule  5  . tiotropium (SPIRIVA HANDIHALER) 18 MCG inhalation capsule Place 1 capsule (18 mcg total) into inhaler and inhale daily.  30 capsule  0   Current Facility-Administered Medications  Medication Dose Route Frequency Provider Last Rate Last Dose  . topical emolient (BIAFINE) emulsion   Topical PRN Jonna Coup, MD         ALLERGIES: Sulfa antibiotics; Xyzal; Pseudoephedrine; and Amoxicillin   LABORATORY DATA:  Lab Results    Component Value Date   WBC 7.2 02/15/2013   HGB 10.4* 02/15/2013   HCT 32.0* 02/15/2013   MCV 90.1 02/15/2013   PLT 233 02/15/2013   Lab Results  Component Value Date   NA 137 06/05/2013   K 4.8 06/05/2013   CL 97 06/05/2013   CO2 27 06/05/2013   Lab Results  Component Value Date   ALT 6 06/05/2013   AST 11 06/05/2013   ALKPHOS 61 06/05/2013   BILITOT 0.4 06/05/2013     NARRATIVE: Alan [name] was seen today for weekly treatment management. The chart was checked and the patient's films were reviewed. The patient states that he is doing fine with treatment. He has had a runny nose and a cough over the last couple of days. No fever.  PHYSICAL EXAMINATION: weight is 159 lb 11.2 oz (72.439 kg). His oral temperature is 98.2 F (36.8 C). His blood pressure is 123/75 and his pulse is 105. His respiration is 22 and oxygen saturation is 99%.      clear to auscultation bilaterally  ASSESSMENT: The patient is doing satisfactorily with treatment.  PLAN: We will continue with the patient's radiation treatment as planned. The patient will take some over-the-counter cold medicine as needed. He is to let us know if his symptoms worsen.  Dry side

## 2013-06-25 ENCOUNTER — Ambulatory Visit
Admission: RE | Admit: 2013-06-25 | Discharge: 2013-06-25 | Disposition: A | Discharge status: Home / Self Care | Payer: Medicare Other | Source: Ambulatory Visit | Attending: Radiation Oncology | Admitting: Radiation Oncology

## 2013-06-26 ENCOUNTER — Ambulatory Visit
Admission: RE | Admit: 2013-06-26 | Discharge: 2013-06-26 | Disposition: A | Discharge status: Home / Self Care | Payer: Medicare Other | Source: Ambulatory Visit | Attending: Radiation Oncology | Admitting: Radiation Oncology

## 2013-06-27 ENCOUNTER — Ambulatory Visit: Payer: Medicare Other

## 2013-06-28 ENCOUNTER — Ambulatory Visit
Admission: RE | Admit: 2013-06-28 | Discharge: 2013-06-28 | Disposition: A | Discharge status: Home / Self Care | Payer: Medicare Other | Source: Ambulatory Visit | Attending: Radiation Oncology | Admitting: Radiation Oncology

## 2013-06-29 ENCOUNTER — Ambulatory Visit
Admission: RE | Admit: 2013-06-29 | Discharge: 2013-06-29 | Disposition: A | Discharge status: Home / Self Care | Payer: Medicare Other | Source: Ambulatory Visit | Attending: Radiation Oncology | Admitting: Radiation Oncology

## 2013-06-29 ENCOUNTER — Encounter: Payer: Self-pay | Admitting: Radiation Oncology

## 2013-06-29 VITALS — BP 135/56 | HR 110 | Temp 98.6°F | Resp 22 | Wt 157.8 lb

## 2013-06-29 DIAGNOSIS — C770 Secondary and unspecified malignant neoplasm of lymph nodes of head, face and neck: Secondary | ICD-10-CM

## 2013-06-29 NOTE — Progress Notes (Signed)
Weekly rad txs 6/10 neck rt, no skin changes  Seen, using biafine bid, 100%  2 liters n/c oxygen, slight swelling still, no difficulty swallowing, no nausea 5:01 PM '

## 2013-06-29 NOTE — Progress Notes (Signed)
Department of Radiation Oncology  Phone:  [phone] Fax:        [phone]  Weekly Treatment Note    Name: [name] Date: 06/29/2013 MRN: [id number] DOB: 04-03-1932   Current dose: 18 Gy  Current fraction: 6   MEDICATIONS: Current Outpatient Prescriptions  Medication Sig Dispense Refill  . albuterol (PROVENTIL HFA;VENTOLIN HFA) 108 (90 BASE) MCG/ACT inhaler Inhale 2 puffs into the lungs every 6 (six) hours as needed for wheezing.      Marland Kitchen albuterol (PROVENTIL) (2.5 MG/3ML) 0.083% nebulizer solution Take 2.5 mg by nebulization every 6 (six) hours as needed for wheezing or shortness of breath.       . emollient (BIAFINE) cream Apply topically as needed.      . fluticasone (FLONASE) 50 MCG/ACT nasal spray Place 2 sprays into the nose daily.  16 g  5  . HYDROcodone-acetaminophen (NORCO/VICODIN) 5-325 MG per tablet Take 1-2 tablets by mouth every 6 (six) hours as needed for pain.  50 tablet  1  . lisinopril (PRINIVIL,ZESTRIL) 5 MG tablet Take 1 tablet (5 mg total) by mouth every morning.  30 tablet  5  . loratadine (CLARITIN) 10 MG tablet Take 10 mg by mouth daily as needed for allergies.       Marland Kitchen LORazepam (ATIVAN PO) Take by mouth as needed. Per Hospice      . metFORMIN (GLUCOPHAGE) 500 MG tablet Take 500 mg by mouth daily with breakfast.      . SYMBICORT 160-4.5 MCG/ACT inhaler Inhale 2 puffs into the lungs 2 (two) times daily.       . tamsulosin (FLOMAX) 0.4 MG CAPS capsule Take 1 capsule (0.4 mg total) by mouth daily.  90 capsule  5  . tiotropium (SPIRIVA HANDIHALER) 18 MCG inhalation capsule Place 1 capsule (18 mcg total) into inhaler and inhale daily.  30 capsule  0   No current facility-administered medications for this encounter.     ALLERGIES: Sulfa antibiotics; Xyzal; Pseudoephedrine; and Amoxicillin   LABORATORY DATA:  Lab Results  Component Value Date   WBC 7.2 02/15/2013   HGB 10.4* 02/15/2013   HCT 32.0* 02/15/2013   MCV 90.1 02/15/2013   PLT 233  02/15/2013   Lab Results  Component Value Date   NA 137 06/05/2013   K 4.8 06/05/2013   CL 97 06/05/2013   CO2 27 06/05/2013   Lab Results  Component Value Date   ALT 6 06/05/2013   AST 11 06/05/2013   ALKPHOS 61 06/05/2013   BILITOT 0.4 06/05/2013     NARRATIVE: Alan [name] was seen today for weekly treatment management. The chart was checked and the patient's films were reviewed. The patient states that he is doing well. No real throat pain, may be just a little bit of scratchiness. No esophagitis. The patient feels that the lymph nodes have shrunk a little bit since beginning treatment.  PHYSICAL EXAMINATION: weight is 157 lb 12.8 oz (71.578 kg). His oral temperature is 98.6 F (37 C). His blood pressure is 135/56 and his pulse is 110. His respiration is 22 and oxygen saturation is 100%.      some erythema emerging in the treatment area without any real bright erythema or desquamation.  ASSESSMENT: The patient is doing satisfactorily with treatment.  PLAN: We will continue with the patient's radiation treatment as planned. The patient's treatment was designed to try to avoid the larynx/esophagus as much as possible. Hopefully he will not have any major problems in this  regard as he finishes his treatment next week.

## 2013-07-02 ENCOUNTER — Ambulatory Visit
Admission: RE | Admit: 2013-07-02 | Discharge: 2013-07-02 | Disposition: A | Discharge status: Home / Self Care | Payer: Medicare Other | Source: Ambulatory Visit | Attending: Radiation Oncology | Admitting: Radiation Oncology

## 2013-07-03 ENCOUNTER — Ambulatory Visit
Admission: RE | Admit: 2013-07-03 | Discharge: 2013-07-03 | Disposition: A | Discharge status: Home / Self Care | Payer: Medicare Other | Source: Ambulatory Visit | Attending: Radiation Oncology | Admitting: Radiation Oncology

## 2013-07-04 ENCOUNTER — Ambulatory Visit
Admission: RE | Admit: 2013-07-04 | Discharge: 2013-07-04 | Disposition: A | Discharge status: Home / Self Care | Payer: Medicare Other | Source: Ambulatory Visit | Attending: Radiation Oncology | Admitting: Radiation Oncology

## 2013-07-04 ENCOUNTER — Ambulatory Visit: Payer: Medicare Other

## 2013-07-06 ENCOUNTER — Ambulatory Visit
Admission: RE | Admit: 2013-07-06 | Discharge: 2013-07-06 | Disposition: A | Discharge status: Home / Self Care | Payer: Medicare Other | Source: Ambulatory Visit | Attending: Radiation Oncology | Admitting: Radiation Oncology

## 2013-07-06 ENCOUNTER — Encounter: Payer: Self-pay | Admitting: Radiation Oncology

## 2013-07-06 VITALS — BP 146/100 | HR 93 | Temp 98.1°F | Resp 20 | Wt 161.0 lb

## 2013-07-06 DIAGNOSIS — C7951 Secondary malignant neoplasm of bone: Secondary | ICD-10-CM

## 2013-07-06 NOTE — Progress Notes (Signed)
Weekly rad txs 10/10 b/l neck, slight erythema, still some swelling R>L, eating better, occasional cramps in neck sometimes 3x day, other times not, eating ensure pudding, drinking glucerna  Also, eating soft foods okay, no difficulty swallowibng stated,gained 4 lbs since last week, 1 month f/u appt card given 10:45 AM

## 2013-07-06 NOTE — Progress Notes (Signed)
  Radiation Oncology         [phone] ________________________________  Name: Alan [name] MRN: [id number]  Date: 07/06/2013  DOB: March 16, 1932  Weekly Radiation Therapy Management  Current Dose: 30 Gy     Planned Dose:  30 Gy  Narrative . . . . . . . . The patient presents for the final under treatment assessment.      still some swelling R>L, eating better, occasional cramps in neck sometimes 3x day, other times not, eating ensure pudding, drinking glucerna Also, eating soft foods okay, no difficulty swallowibng stated,                                 Set-up films were reviewed.                                 The chart was checked. Physical Findings. . . Weight gained 4 lbs since last week.  No significant changes. Impression . . . . . . . The patient tolerated radiation relatively well. Plan . . . . . . . . . . . . Complete radiation today as scheduled, and follow-up in one month. The patient was encouraged to call or return to the clinic in the interim for any worsening symptoms. 1 month f/u appt card given  ________________________________  Artist Pais. Kathrynn Running, M.D.

## 2013-07-09 ENCOUNTER — Ambulatory Visit: Payer: Medicare Other

## 2013-07-15 NOTE — Progress Notes (Signed)
  Radiation Oncology         [phone] ________________________________  Name: Alan [name] MRN: [id number]  Date: 07/06/2013  DOB: 03-Jun-1932  End of Treatment Note  Diagnosis:   Metastatic lung cancer     Indication for treatment:  Palliative       Radiation treatment dates:   06/21/2013 through 07/06/2013  Site/dose:   The patient was treated to the bulky lymphadenopathy within the neck bilaterally. He received 30 gray in 10 fractions at 3 gray per fraction using a 2 field technique. Care was taken to ensure an appropriate overlap with prior radiation treatment which did overlap to some degree.  Narrative: The patient tolerated radiation treatment relatively well.   The patient experienced some decrease in the size of the lymphadenopathy during treatment. Some redness of skin but no unexpected acute toxicity.  Plan: The patient has completed radiation treatment. The patient will return to radiation oncology clinic for routine followup in one month. I advised the patient to call or return sooner if they have any questions or concerns related to their recovery or treatment. ________________________________  Jodelle Gross, M.D., Ph.D.

## 2013-07-16 ENCOUNTER — Telehealth: Payer: Self-pay | Admitting: Family Medicine

## 2013-07-18 NOTE — Telephone Encounter (Signed)
NTBS.

## 2013-07-18 NOTE — Telephone Encounter (Signed)
PAtient aware 

## 2013-07-20 ENCOUNTER — Telehealth: Payer: Self-pay | Admitting: Oncology

## 2013-07-20 ENCOUNTER — Ambulatory Visit (HOSPITAL_BASED_OUTPATIENT_CLINIC_OR_DEPARTMENT_OTHER): Payer: Medicare Other | Admitting: Oncology

## 2013-07-20 VITALS — BP 117/72 | HR 108 | Temp 96.6°F | Resp 18 | Ht 65.0 in | Wt 156.9 lb

## 2013-07-20 DIAGNOSIS — C7951 Secondary malignant neoplasm of bone: Secondary | ICD-10-CM

## 2013-07-20 DIAGNOSIS — C3400 Malignant neoplasm of unspecified main bronchus: Secondary | ICD-10-CM

## 2013-07-20 DIAGNOSIS — C7952 Secondary malignant neoplasm of bone marrow: Secondary | ICD-10-CM

## 2013-07-20 DIAGNOSIS — D649 Anemia, unspecified: Secondary | ICD-10-CM

## 2013-07-20 DIAGNOSIS — R970 Elevated carcinoembryonic antigen [CEA]: Secondary | ICD-10-CM

## 2013-07-20 DIAGNOSIS — C3402 Malignant neoplasm of left main bronchus: Secondary | ICD-10-CM

## 2013-07-20 DIAGNOSIS — C189 Malignant neoplasm of colon, unspecified: Secondary | ICD-10-CM

## 2013-07-20 NOTE — Telephone Encounter (Signed)
gave pt appt for md only on February 2015

## 2013-07-20 NOTE — Progress Notes (Signed)
   [facility]    OFFICE PROGRESS NOTE   INTERVAL HISTORY:   He returns for scheduled followup of metastatic lung cancer. He completed palliative radiation to the bilateral neck on 07/06/2013. He reports improvement in the neck pain. He wears oxygen at home. His appetite and energy level were poor while on radiation, this has now improved. He developed increased leg edema a few weeks ago. This has also improved. He was discharged from hospice when he began palliative radiation.  Objective:  Vital signs in last 24 hours:  Blood pressure 117/72, pulse 108, temperature 96.6 F (35.9 C), temperature source Oral, resp. rate 18, height 5' 5" (1.651 m), weight 156 lb 14.4 oz (71.169 kg).    HEENT: Oral cavity without thrush or ulcers Lymphatics: Matted firm nodes in the bilateral lower neck, no axillary nodes Resp: Distant breath sounds, scattered end expiratory wheeze, no respiratory distress Cardio: Regular rate and rhythm GI: No hepatosplenomegaly Vascular: 1+ pitting edema below the knee bilaterally Skin: Radiation skin changes at the low neck bilaterally     Medications: I have reviewed the patient's current medications.  Assessment/Plan: 1.Non-small cell lung cancer-obstructing left endobronchial lesion with chest lymphadenopathy, status post a bronchoscopy/EBUS 11/20/2012 with the pathology confirming adenocarcinoma of lung primary , ALK negative , EGFR mutation negative  -Staging PET scan 12/05/2012 consistent with metastatic neck/chest adenopathy, retroperitoneal adenopathy, and bone metastases  -Status post palliative radiation to the left chest completed 12/15/2012  2. Respiratory failure following the bronchoscopy procedure 11/20/2012, now maintained on supplemental oxygen and bronchodilator therapy -admitted with acute respiratory failure 12/26/2012-improved following a steroid taper  3. Stage II colon cancer April 2014  4. Abdominal aortic aneurysm-planned  percutaneous graft repair has been placed on hold  5. Diabetes  6. Anemia  7. Markedly elevated CEA-most likely secondary to the non-small cell lung cancer  8. Pain/mass at the right mandible-status post palliative radiation completed 03/15/2013 , improved  9. Oral Mucositis secondary to radiation -resolved  10. Progressive bilateral neck lymphadenopathy with associated pain, status post palliative radiation completed 07/06/2013, improved    Disposition:  He completed palliative radiation to the bilateral neck with improvement in pain. His overall performance status appears stable. Mr. Alen will return for an office visit in one month. He will contact us in the interim for new symptoms. I suspect the leg edema is secondary to malnutrition. He will elevate the legs.   SHERRILL, GARY, MD  07/20/2013  1:36 PM    

## 2013-08-06 ENCOUNTER — Encounter: Payer: Self-pay | Admitting: Radiation Oncology

## 2013-08-09 ENCOUNTER — Ambulatory Visit: Admission: RE | Admit: 2013-08-09 | Payer: Medicare Other | Source: Ambulatory Visit | Admitting: Radiation Oncology

## 2013-08-17 ENCOUNTER — Ambulatory Visit
Admission: RE | Admit: 2013-08-17 | Discharge: 2013-08-17 | Disposition: A | Discharge status: Home / Self Care | Payer: Medicare Other | Source: Ambulatory Visit | Attending: Radiation Oncology | Admitting: Radiation Oncology

## 2013-08-17 ENCOUNTER — Ambulatory Visit (HOSPITAL_BASED_OUTPATIENT_CLINIC_OR_DEPARTMENT_OTHER): Payer: Medicare Other | Admitting: Oncology

## 2013-08-17 ENCOUNTER — Encounter: Payer: Self-pay | Admitting: Radiation Oncology

## 2013-08-17 ENCOUNTER — Telehealth: Payer: Self-pay | Admitting: Oncology

## 2013-08-17 VITALS — BP 127/75 | HR 110 | Temp 97.4°F | Resp 20 | Wt 153.6 lb

## 2013-08-17 VITALS — BP 104/44 | HR 112 | Temp 97.0°F | Resp 18 | Ht 65.0 in | Wt 152.7 lb

## 2013-08-17 DIAGNOSIS — C3400 Malignant neoplasm of unspecified main bronchus: Secondary | ICD-10-CM

## 2013-08-17 DIAGNOSIS — C770 Secondary and unspecified malignant neoplasm of lymph nodes of head, face and neck: Secondary | ICD-10-CM

## 2013-08-17 DIAGNOSIS — K5900 Constipation, unspecified: Secondary | ICD-10-CM

## 2013-08-17 DIAGNOSIS — R970 Elevated carcinoembryonic antigen [CEA]: Secondary | ICD-10-CM

## 2013-08-17 DIAGNOSIS — C182 Malignant neoplasm of ascending colon: Secondary | ICD-10-CM

## 2013-08-17 NOTE — Telephone Encounter (Signed)
gave pt appt for md on March 2015

## 2013-08-17 NOTE — Progress Notes (Signed)
Follow up s/p rad tx 06/21/13-07/06/13, c/o abd pain, rlower groin area, costipated, did have nm after suppository, ate pancakes for breakfast, 6 cups coffee,  Water, mild cough dail;y clear sputum, energy level fair,  11:22 AM

## 2013-08-17 NOTE — Progress Notes (Signed)
   [facility]    OFFICE PROGRESS NOTE   INTERVAL HISTORY:   Alan [name] returns for scheduled followup of lung cancer. The neck adenopathy has diminished. No pain. He complains of constipation. He is constipated despite Colace. He reports that his stools for the past week. Poor appetite.  Objective:  Vital signs in last 24 hours:  Blood pressure 104/44, pulse 112, temperature 97 F (36.1 C), temperature source Oral, resp. rate 18, height _0  (1.651 m), weight 152 lb 11.2 oz (69.264 kg), SpO2 100.00%.    HEENT: No thrush Lymphatics: Firm matted nodes in the bilateral neck. No axillary or inguinal nodes Resp: Clear bilaterally, no respiratory distress Cardio: Regular rate and rhythm GI: No hepatomegaly, nontender, no mass,? Left inguinal hernia Vascular: Trace edema at the right greater than left lower leg    Medications: I have reviewed the patient's current medications.  Assessment/Plan: 1.Non-small cell lung cancer-obstructing left endobronchial lesion with chest lymphadenopathy, status post a bronchoscopy/EBUS 11/20/2012 with the pathology confirming adenocarcinoma of lung primary , ALK negative , EGFR mutation negative  -Staging PET scan 12/05/2012 consistent with metastatic neck/chest adenopathy, retroperitoneal adenopathy, and bone metastases  -Status post palliative radiation to the left chest completed 12/15/2012  2. Respiratory failure following the bronchoscopy procedure 11/20/2012, now maintained on supplemental oxygen and bronchodilator therapy -admitted with acute respiratory failure 12/26/2012-improved following a steroid taper  3. Stage II colon cancer April 2014  4. Abdominal aortic aneurysm-planned percutaneous graft repair has been placed on hold  5. Diabetes  6. Anemia  7. Markedly elevated CEA-most likely secondary to the non-small cell lung cancer  8. Pain/mass at the right mandible-status post palliative radiation completed 03/15/2013 ,  improved  9. Oral Mucositis secondary to radiation -resolved  10. Progressive bilateral neck lymphadenopathy with associated pain, status post palliative radiation completed 07/06/2013, improved  11. Constipation-he will continue Colace and add MiraLAX   Disposition:  Alan [name] has developed constipation. I doubt he has a colon or rectal mass since he is less than 1 year out from colon surgery. He will contact us if the MiraLAX does not help the constipation. Alan [name] will return for an office visit in one month.   Betsy Coder, MD  08/17/2013  1:22 PM

## 2013-08-17 NOTE — Progress Notes (Signed)
  Radiation Oncology         [phone] ________________________________  Name: Alan [name] MRN: [id number]  Date: 08/17/2013  DOB: [birth year]  Follow-Up Visit Note  CC: Anthoney Harada, MD  Vernie Shanks, MD  Diagnosis:   Metastatic lung cancer  Interval Since Last Radiation:  6 weeks, palliative radiation treatment to the neck for bulky cervical lymphadenopathy   Narrative:  The patient returns today for routine follow-up.  The patient states that he is improved. Feeling well overall but does complain of some constipation. The neck lymph nodes have diminished in size. No pain currently.                              ALLERGIES:  is allergic to sulfa antibiotics; xyzal; pseudoephedrine; and amoxicillin.  Meds: Current Outpatient Prescriptions  Medication Sig Dispense Refill  . albuterol (PROVENTIL HFA;VENTOLIN HFA) 108 (90 BASE) MCG/ACT inhaler Inhale 2 puffs into the lungs every 6 (six) hours as needed for wheezing.      Marland Kitchen albuterol (PROVENTIL) (2.5 MG/3ML) 0.083% nebulizer solution Take 2.5 mg by nebulization every 6 (six) hours as needed for wheezing or shortness of breath.       . fluticasone (FLONASE) 50 MCG/ACT nasal spray Place 2 sprays into the nose daily.  16 g  5  . lisinopril (PRINIVIL,ZESTRIL) 5 MG tablet Take 1 tablet (5 mg total) by mouth every morning.  30 tablet  5  . loratadine (CLARITIN) 10 MG tablet Take 10 mg by mouth daily as needed for allergies.       . metFORMIN (GLUCOPHAGE) 500 MG tablet Take 500 mg by mouth daily with breakfast.      . SYMBICORT 160-4.5 MCG/ACT inhaler Inhale 2 puffs into the lungs 2 (two) times daily.       . tamsulosin (FLOMAX) 0.4 MG CAPS capsule Take 1 capsule (0.4 mg total) by mouth daily.  90 capsule  5  . tiotropium (SPIRIVA HANDIHALER) 18 MCG inhalation capsule Place 1 capsule (18 mcg total) into inhaler and inhale daily.  30 capsule  0  . LORazepam (ATIVAN PO) Take by mouth as needed. Per Hospice       No current  facility-administered medications for this encounter.    Physical Findings: The patient is in no acute distress. Patient is alert and oriented.  weight is 153 lb 9.6 oz (69.673 kg). His oral temperature is 97.4 F (36.3 C). His blood pressure is 127/75 and his pulse is 110. His respiration is 20 and oxygen saturation is 90%. .   Bulky lymphadenopathy has decreased in size, still firm bilaterally. Residual hyperpigmentation present but the skin has recovered satisfactorily.  Lab Findings: Lab Results  Component Value Date   WBC 7.2 02/15/2013   HGB 10.4* 02/15/2013   HCT 32.0* 02/15/2013   MCV 90.1 02/15/2013   PLT 233 02/15/2013     Radiographic Findings: No results found.  Impression:    The patient achieved a nice palliative response at this point from his radiation treatment. Hopefully this will persist although residual lymphadenopathy remains. The patient is seeing medical oncology later today. No further role for radiation treatment at the current time.  Plan:  The patient will followup in our clinic in 3 months.   Jodelle Gross, M.D., Ph.D.

## 2013-08-21 ENCOUNTER — Telehealth: Payer: Self-pay | Admitting: Family Medicine

## 2013-08-21 MED ORDER — TIOTROPIUM BROMIDE MONOHYDRATE 18 MCG IN CAPS: 18.0000 ug | ORAL_CAPSULE | Freq: Every day | RESPIRATORY_TRACT | Status: DC

## 2013-08-21 MED ORDER — BUDESONIDE-FORMOTEROL FUMARATE 160-4.5 MCG/ACT IN AERO: 2.0000 | INHALATION_SPRAY | Freq: Two times a day (BID) | RESPIRATORY_TRACT | Status: DC

## 2013-08-21 NOTE — Telephone Encounter (Signed)
Samples up front. Patient aware.

## 2013-09-05 ENCOUNTER — Other Ambulatory Visit: Payer: Self-pay | Admitting: Family Medicine

## 2013-09-07 ENCOUNTER — Telehealth: Payer: Self-pay | Admitting: Family Medicine

## 2013-09-07 ENCOUNTER — Other Ambulatory Visit: Payer: Self-pay | Admitting: Family Medicine

## 2013-09-07 DIAGNOSIS — N400 Enlarged prostate without lower urinary tract symptoms: Secondary | ICD-10-CM

## 2013-09-07 MED ORDER — LISINOPRIL 5 MG PO TABS: ORAL_TABLET | ORAL | Status: DC

## 2013-09-07 MED ORDER — TAMSULOSIN HCL 0.4 MG PO CAPS: 0.4000 mg | ORAL_CAPSULE | Freq: Every day | ORAL | Status: DC

## 2013-09-07 NOTE — Telephone Encounter (Signed)
Call patient : Prescription refilled & sent to pharmacy in [place] samples if we have.

## 2013-09-07 NOTE — Telephone Encounter (Signed)
Pt aware no samples available

## 2013-09-07 NOTE — Telephone Encounter (Signed)
Aware,no sample available.  Please see refill request.

## 2013-09-14 ENCOUNTER — Telehealth: Payer: Self-pay | Admitting: *Deleted

## 2013-09-14 ENCOUNTER — Ambulatory Visit: Payer: Medicare Other | Admitting: Oncology

## 2013-09-14 NOTE — Telephone Encounter (Signed)
Call from [place] reporting pt canceled appt due to icy road conditions this morning. Requesting to be rescheduled.

## 2013-09-17 ENCOUNTER — Telehealth: Payer: Self-pay | Admitting: *Deleted

## 2013-09-17 ENCOUNTER — Other Ambulatory Visit: Payer: Self-pay | Admitting: *Deleted

## 2013-09-17 ENCOUNTER — Telehealth: Payer: Self-pay | Admitting: Oncology

## 2013-09-17 NOTE — Telephone Encounter (Signed)
returned pt call line busy...Marland KitchenMarland Kitchen

## 2013-09-17 NOTE — Telephone Encounter (Signed)
Asking to reschedule the appointment he missed on 09/13/13-usually seen monthly per BS. Made her aware that MD out of office this week. Will call her with new appointment when MD returns-schedule full at this time unless he sees NP.

## 2013-09-17 NOTE — Progress Notes (Signed)
Per Dr. Benay Spice : Schedule patient for 3/20 at 10:15 am with him-15 minutes. POF to scheduler w/email.

## 2013-09-25 ENCOUNTER — Telehealth: Payer: Self-pay | Admitting: Oncology

## 2013-09-25 ENCOUNTER — Telehealth: Payer: Self-pay | Admitting: *Deleted

## 2013-09-25 NOTE — Telephone Encounter (Signed)
Amy RN informed pt of appt on 3/20 with Dr. Benay Spice

## 2013-09-25 NOTE — Telephone Encounter (Addendum)
Received call from pt's daughter-in-law requesting appt re-schedule from 3/6 d/t snow.  Per Dr. Benay Spice; notified Kim appt for pt is 09/28/13 @ 10:15.  Kim verbalized understanding and expressed appreciation for call back.

## 2013-09-28 ENCOUNTER — Telehealth: Payer: Self-pay | Admitting: Oncology

## 2013-09-28 ENCOUNTER — Ambulatory Visit (HOSPITAL_BASED_OUTPATIENT_CLINIC_OR_DEPARTMENT_OTHER): Payer: Medicare Other | Admitting: Oncology

## 2013-09-28 ENCOUNTER — Ambulatory Visit (HOSPITAL_COMMUNITY)
Admission: RE | Admit: 2013-09-28 | Discharge: 2013-09-28 | Disposition: A | Discharge status: Home / Self Care | Payer: Medicare Other | Source: Ambulatory Visit | Attending: Oncology | Admitting: Oncology

## 2013-09-28 ENCOUNTER — Telehealth: Payer: Self-pay | Admitting: *Deleted

## 2013-09-28 VITALS — BP 101/47 | HR 112 | Temp 97.5°F | Resp 20 | Ht 65.0 in | Wt 152.2 lb

## 2013-09-28 DIAGNOSIS — I7090 Unspecified atherosclerosis: Secondary | ICD-10-CM | POA: Insufficient documentation

## 2013-09-28 DIAGNOSIS — C3400 Malignant neoplasm of unspecified main bronchus: Secondary | ICD-10-CM

## 2013-09-28 DIAGNOSIS — C189 Malignant neoplasm of colon, unspecified: Secondary | ICD-10-CM

## 2013-09-28 DIAGNOSIS — Z85118 Personal history of other malignant neoplasm of bronchus and lung: Secondary | ICD-10-CM | POA: Insufficient documentation

## 2013-09-28 DIAGNOSIS — M25559 Pain in unspecified hip: Secondary | ICD-10-CM

## 2013-09-28 DIAGNOSIS — I714 Abdominal aortic aneurysm, without rupture, unspecified: Secondary | ICD-10-CM

## 2013-09-28 DIAGNOSIS — M79609 Pain in unspecified limb: Secondary | ICD-10-CM | POA: Insufficient documentation

## 2013-09-28 DIAGNOSIS — R599 Enlarged lymph nodes, unspecified: Secondary | ICD-10-CM

## 2013-09-28 DIAGNOSIS — C7951 Secondary malignant neoplasm of bone: Secondary | ICD-10-CM

## 2013-09-28 DIAGNOSIS — E119 Type 2 diabetes mellitus without complications: Secondary | ICD-10-CM

## 2013-09-28 DIAGNOSIS — K5900 Constipation, unspecified: Secondary | ICD-10-CM

## 2013-09-28 DIAGNOSIS — M949 Disorder of cartilage, unspecified: Principal | ICD-10-CM

## 2013-09-28 DIAGNOSIS — D649 Anemia, unspecified: Secondary | ICD-10-CM

## 2013-09-28 DIAGNOSIS — C7952 Secondary malignant neoplasm of bone marrow: Secondary | ICD-10-CM

## 2013-09-28 DIAGNOSIS — M899 Disorder of bone, unspecified: Secondary | ICD-10-CM | POA: Insufficient documentation

## 2013-09-28 NOTE — Telephone Encounter (Signed)
gv pt wife appt schedule for april. per wife pt has already done xray.

## 2013-09-28 NOTE — Telephone Encounter (Signed)
Dr. Benay Spice spoke with Dr. Berenice Primas regarding xray right femur-shows lytic destructive lesion with high potential for fracture. Dr. Berenice Primas will see him on Monday (his office will call). Needs to be non-weight bearing due to fracture risk. Left message on mobile VM to call office asap regarding xray results and to stop bearing weight on right leg now. Get crutches to ambulate or use w/c. Will also leave message in My Chart. Called back and spoke with patient and wife. Dr. Berenice Primas had already called them. She will go to [facility] and get crutches today. Confirms they understand no weight bearing.

## 2013-09-28 NOTE — Progress Notes (Signed)
   [place]    OFFICE PROGRESS NOTE   INTERVAL HISTORY:   Alan [name] returns for scheduled followup of metastatic lung cancer. He complains of early satiety. His bowels are functioning since he started MiraLAX. His chief complaint is pain in the right thigh. The pain is worse with weightbearing. He used a heating pad and developed a rash over the right thigh. The rash was not present prior to the heating pad.  Objective:  Vital signs in last 24 hours:  Blood pressure 101/47, pulse 112, temperature 97.5 F (36.4 C), temperature source Oral, resp. rate 20, height 5' 5" (1.651 m), weight 152 lb 3.2 oz (69.037 kg), SpO2 97.00%.    Lymphatics: Firm matted nodes in the bilateral lower neck, right greater than left. No axillary nodes. Resp: Distant breath sounds, clear bilaterally Cardio: Regular rate and rhythm GI: No hepatomegaly, nontender, no mass Vascular: Trace low leg edema bilaterally Musculoskeletal: Pain with motion at the right hip  Skin: Erythematous dry healing lesions over the right medial thigh and calf     Medications: I have reviewed the patient's current medications.  Assessment/Plan: 1.Non-small cell lung cancer-obstructing left endobronchial lesion with chest lymphadenopathy, status post a bronchoscopy/EBUS 11/20/2012 with the pathology confirming adenocarcinoma of lung primary , ALK negative , EGFR mutation negative  -Staging PET scan 12/05/2012 consistent with metastatic neck/chest adenopathy, retroperitoneal adenopathy, and bone metastases  -Status post palliative radiation to the left chest completed 12/15/2012  2. Respiratory failure following the bronchoscopy procedure 11/20/2012, now maintained on supplemental oxygen and bronchodilator therapy -admitted with acute respiratory failure 12/26/2012-improved following a steroid taper  3. Stage II colon cancer April 2014  4. Abdominal aortic aneurysm-planned percutaneous graft repair has been  placed on hold  5. Diabetes  6. Anemia  7. Markedly elevated CEA-most likely secondary to the non-small cell lung cancer  8. Pain/mass at the right mandible-status post palliative radiation completed 03/15/2013 , improved  9. Oral Mucositis secondary to radiation -resolved  10. Progressive bilateral neck lymphadenopathy with associated pain, status post palliative radiation completed 07/06/2013, improved  11. Constipation-improved with MiraLAX 12. Pain in the right thigh-likely related to a known metastasis at the right femur, he completed palliative radiation to the right femur last year.  Disposition:  His overall performance status appears unchanged. We will refer him for an x-ray of the right femur and hip today. We will make an orthopedic referral as indicated.  Alan [name] will return for an office visit in one month.   Betsy Coder, MD  09/28/2013  10:28 AM

## 2013-10-02 ENCOUNTER — Telehealth: Payer: Self-pay | Admitting: *Deleted

## 2013-10-02 ENCOUNTER — Other Ambulatory Visit: Payer: Self-pay | Admitting: Orthopedic Surgery

## 2013-10-02 ENCOUNTER — Encounter (HOSPITAL_COMMUNITY): Payer: Self-pay | Admitting: *Deleted

## 2013-10-02 NOTE — Telephone Encounter (Signed)
Patient called to return call that nurse left him regarding walker or crutches. Made him aware this was from Friday- this RN had left VM and sent My Chart message. This issue was already managed. He reports he is having the rod put in his leg tomorrow-does not know yet where it will be done. Waiting to hear from Dr. Berenice Primas.

## 2013-10-03 ENCOUNTER — Encounter (HOSPITAL_COMMUNITY)
Admission: RE | Disposition: A | Discharge status: Home Health | Payer: Self-pay | Source: Ambulatory Visit | Attending: Orthopedic Surgery

## 2013-10-03 ENCOUNTER — Ambulatory Visit (HOSPITAL_COMMUNITY): Payer: Medicare Other

## 2013-10-03 ENCOUNTER — Ambulatory Visit (HOSPITAL_COMMUNITY)
Admission: RE | Admit: 2013-10-03 | Discharge: 2013-10-04 | DRG: 481 | Disposition: A | Discharge status: Home Health | Payer: Medicare Other | Source: Ambulatory Visit | Attending: Orthopedic Surgery | Admitting: Orthopedic Surgery

## 2013-10-03 ENCOUNTER — Encounter (HOSPITAL_COMMUNITY): Payer: Self-pay | Admitting: Surgery

## 2013-10-03 ENCOUNTER — Encounter (HOSPITAL_COMMUNITY): Payer: Medicare Other | Admitting: Certified Registered"

## 2013-10-03 ENCOUNTER — Ambulatory Visit (HOSPITAL_COMMUNITY): Payer: Medicare Other | Admitting: Certified Registered"

## 2013-10-03 DIAGNOSIS — I714 Abdominal aortic aneurysm, without rupture, unspecified: Secondary | ICD-10-CM | POA: Insufficient documentation

## 2013-10-03 DIAGNOSIS — C779 Secondary and unspecified malignant neoplasm of lymph node, unspecified: Secondary | ICD-10-CM | POA: Insufficient documentation

## 2013-10-03 DIAGNOSIS — J449 Chronic obstructive pulmonary disease, unspecified: Secondary | ICD-10-CM | POA: Insufficient documentation

## 2013-10-03 DIAGNOSIS — E119 Type 2 diabetes mellitus without complications: Secondary | ICD-10-CM | POA: Insufficient documentation

## 2013-10-03 DIAGNOSIS — Z9049 Acquired absence of other specified parts of digestive tract: Secondary | ICD-10-CM | POA: Insufficient documentation

## 2013-10-03 DIAGNOSIS — C7952 Secondary malignant neoplasm of bone marrow: Secondary | ICD-10-CM

## 2013-10-03 DIAGNOSIS — M899 Disorder of bone, unspecified: Secondary | ICD-10-CM | POA: Insufficient documentation

## 2013-10-03 DIAGNOSIS — I739 Peripheral vascular disease, unspecified: Secondary | ICD-10-CM | POA: Insufficient documentation

## 2013-10-03 DIAGNOSIS — C7951 Secondary malignant neoplasm of bone: Secondary | ICD-10-CM | POA: Insufficient documentation

## 2013-10-03 DIAGNOSIS — Z85038 Personal history of other malignant neoplasm of large intestine: Secondary | ICD-10-CM | POA: Insufficient documentation

## 2013-10-03 DIAGNOSIS — Z9981 Dependence on supplemental oxygen: Secondary | ICD-10-CM | POA: Insufficient documentation

## 2013-10-03 DIAGNOSIS — I10 Essential (primary) hypertension: Secondary | ICD-10-CM | POA: Insufficient documentation

## 2013-10-03 DIAGNOSIS — C3490 Malignant neoplasm of unspecified part of unspecified bronchus or lung: Secondary | ICD-10-CM | POA: Insufficient documentation

## 2013-10-03 DIAGNOSIS — M84451A Pathological fracture, right femur, initial encounter for fracture: Secondary | ICD-10-CM | POA: Diagnosis present

## 2013-10-03 DIAGNOSIS — M949 Disorder of cartilage, unspecified: Principal | ICD-10-CM

## 2013-10-03 DIAGNOSIS — Z923 Personal history of irradiation: Secondary | ICD-10-CM | POA: Insufficient documentation

## 2013-10-03 DIAGNOSIS — J4489 Other specified chronic obstructive pulmonary disease: Secondary | ICD-10-CM | POA: Insufficient documentation

## 2013-10-03 DIAGNOSIS — Z87891 Personal history of nicotine dependence: Secondary | ICD-10-CM | POA: Insufficient documentation

## 2013-10-03 HISTORY — DX: Pneumonia, unspecified organism: J18.9

## 2013-10-03 HISTORY — PX: FEMUR IM NAIL: SHX1597

## 2013-10-03 LAB — GLUCOSE, CAPILLARY
GLUCOSE-CAPILLARY: 77 mg/dL (ref 70–99)
Glucose-Capillary: 85 mg/dL (ref 70–99)
Glucose-Capillary: 112 mg/dL — ABNORMAL HIGH (ref 70–99)
Glucose-Capillary: 69 mg/dL — ABNORMAL LOW (ref 70–99)
Glucose-Capillary: 118 mg/dL — ABNORMAL HIGH (ref 70–99)
Glucose-Capillary: 108 mg/dL — ABNORMAL HIGH (ref 70–99)
Glucose-Capillary: 139 mg/dL — ABNORMAL HIGH (ref 70–99)

## 2013-10-03 LAB — BASIC METABOLIC PANEL
BUN: 10 mg/dL (ref 6–23)
CALCIUM: 9.5 mg/dL (ref 8.4–10.5)
CO2: 28 mEq/L (ref 19–32)
Chloride: 92 mEq/L — ABNORMAL LOW (ref 96–112)
Creatinine, Ser: 0.42 mg/dL — ABNORMAL LOW (ref 0.50–1.35)
Glucose, Bld: 89 mg/dL (ref 70–99)
POTASSIUM: 3.6 meq/L — AB (ref 3.7–5.3)
SODIUM: 134 meq/L — AB (ref 137–147)

## 2013-10-03 LAB — CBC
HEMATOCRIT: 31.7 % — AB (ref 39.0–52.0)
Hemoglobin: 10.2 g/dL — ABNORMAL LOW (ref 13.0–17.0)
MCH: 27.8 pg (ref 26.0–34.0)
MCHC: 32.2 g/dL (ref 30.0–36.0)
MCV: 86.4 fL (ref 78.0–100.0)
Platelets: 268 10*3/uL (ref 150–400)
RBC: 3.67 MIL/uL — ABNORMAL LOW (ref 4.22–5.81)
RDW: 15.2 % (ref 11.5–15.5)
WBC: 6.6 10*3/uL (ref 4.0–10.5)

## 2013-10-03 SURGERY — INSERTION, INTRAMEDULLARY ROD, FEMUR
Anesthesia: Spinal | Site: Leg Upper | Laterality: Right

## 2013-10-03 MED ORDER — CLINDAMYCIN PHOSPHATE 600 MG/50ML IV SOLN
600.0000 mg | Freq: Four times a day (QID) | INTRAVENOUS | Status: AC
  Administered 2013-10-03 – 2013-10-04 (×3): 600 mg via INTRAVENOUS
  Filled 2013-10-03 (×3): qty 50

## 2013-10-03 MED ORDER — FENTANYL CITRATE 0.05 MG/ML IJ SOLN: 25.0000 ug | INTRAMUSCULAR | Status: DC | PRN

## 2013-10-03 MED ORDER — ALBUTEROL SULFATE (2.5 MG/3ML) 0.083% IN NEBU
3.0000 mL | INHALATION_SOLUTION | Freq: Four times a day (QID) | RESPIRATORY_TRACT | Status: DC | PRN
  Administered 2013-10-04: 3 mL via RESPIRATORY_TRACT
  Filled 2013-10-03: qty 3

## 2013-10-03 MED ORDER — METHOCARBAMOL 500 MG PO TABS
500.0000 mg | ORAL_TABLET | Freq: Four times a day (QID) | ORAL | Status: DC | PRN
  Administered 2013-10-03 – 2013-10-04 (×2): 500 mg via ORAL
  Filled 2013-10-03 (×2): qty 1

## 2013-10-03 MED ORDER — ASPIRIN EC 325 MG PO TBEC: 325.0000 mg | DELAYED_RELEASE_TABLET | Freq: Every day | ORAL | Status: AC

## 2013-10-03 MED ORDER — HYDROCODONE-ACETAMINOPHEN 5-325 MG PO TABS
1.0000 | ORAL_TABLET | ORAL | Status: DC | PRN
  Administered 2013-10-04: 1 via ORAL
  Filled 2013-10-03: qty 1

## 2013-10-03 MED ORDER — METHOCARBAMOL 100 MG/ML IJ SOLN
500.0000 mg | Freq: Four times a day (QID) | INTRAVENOUS | Status: DC | PRN
  Filled 2013-10-03: qty 5

## 2013-10-03 MED ORDER — LACTATED RINGERS IV SOLN
INTRAVENOUS | Status: DC
  Administered 2013-10-03 (×2): via INTRAVENOUS

## 2013-10-03 MED ORDER — PROPOFOL 10 MG/ML IV BOLUS
INTRAVENOUS | Status: AC
  Filled 2013-10-03: qty 20

## 2013-10-03 MED ORDER — SODIUM CHLORIDE 0.9 % IV SOLN
INTRAVENOUS | Status: DC
  Administered 2013-10-03: 20:00:00 via INTRAVENOUS

## 2013-10-03 MED ORDER — FENTANYL CITRATE 0.05 MG/ML IJ SOLN
INTRAMUSCULAR | Status: AC
  Filled 2013-10-03: qty 5

## 2013-10-03 MED ORDER — ONDANSETRON HCL 4 MG PO TABS: 4.0000 mg | ORAL_TABLET | Freq: Four times a day (QID) | ORAL | Status: DC | PRN

## 2013-10-03 MED ORDER — CLINDAMYCIN PHOSPHATE 900 MG/50ML IV SOLN
900.0000 mg | INTRAVENOUS | Status: AC
  Administered 2013-10-03: 900 mg via INTRAVENOUS
  Filled 2013-10-03: qty 50

## 2013-10-03 MED ORDER — ONDANSETRON HCL 4 MG/2ML IJ SOLN: 4.0000 mg | Freq: Once | INTRAMUSCULAR | Status: DC | PRN

## 2013-10-03 MED ORDER — PROPOFOL INFUSION 10 MG/ML OPTIME
INTRAVENOUS | Status: DC | PRN
  Administered 2013-10-03: 75 ug/kg/min via INTRAVENOUS

## 2013-10-03 MED ORDER — PHENYLEPHRINE HCL 10 MG/ML IJ SOLN
10.0000 mg | INTRAVENOUS | Status: DC | PRN
  Administered 2013-10-03: 80 ug/min via INTRAVENOUS

## 2013-10-03 MED ORDER — LISINOPRIL 5 MG PO TABS
5.0000 mg | ORAL_TABLET | Freq: Every day | ORAL | Status: DC
  Administered 2013-10-03 – 2013-10-04 (×2): 5 mg via ORAL
  Filled 2013-10-03 (×3): qty 1

## 2013-10-03 MED ORDER — BUDESONIDE-FORMOTEROL FUMARATE 160-4.5 MCG/ACT IN AERO
2.0000 | INHALATION_SPRAY | Freq: Two times a day (BID) | RESPIRATORY_TRACT | Status: DC
  Filled 2013-10-03: qty 6

## 2013-10-03 MED ORDER — 0.9 % SODIUM CHLORIDE (POUR BTL) OPTIME
TOPICAL | Status: DC | PRN
  Administered 2013-10-03: 1000 mL

## 2013-10-03 MED ORDER — TAMSULOSIN HCL 0.4 MG PO CAPS
0.4000 mg | ORAL_CAPSULE | Freq: Every day | ORAL | Status: DC
  Administered 2013-10-03 – 2013-10-04 (×2): 0.4 mg via ORAL
  Filled 2013-10-03 (×2): qty 1

## 2013-10-03 MED ORDER — EPHEDRINE SULFATE 50 MG/ML IJ SOLN
INTRAMUSCULAR | Status: AC
  Filled 2013-10-03: qty 1

## 2013-10-03 MED ORDER — HYDROCODONE-ACETAMINOPHEN 5-325 MG PO TABS: 1.0000 | ORAL_TABLET | Freq: Four times a day (QID) | ORAL | Status: AC | PRN

## 2013-10-03 MED ORDER — TIOTROPIUM BROMIDE MONOHYDRATE 18 MCG IN CAPS
18.0000 ug | ORAL_CAPSULE | Freq: Every day | RESPIRATORY_TRACT | Status: DC
  Filled 2013-10-03: qty 5

## 2013-10-03 MED ORDER — DEXTROSE 50 % IV SOLN
12.5000 g | Freq: Once | INTRAVENOUS | Status: AC
  Administered 2013-10-03: 12.5 g via INTRAVENOUS

## 2013-10-03 MED ORDER — PROPOFOL 10 MG/ML IV EMUL
INTRAVENOUS | Status: AC
  Filled 2013-10-03: qty 50

## 2013-10-03 MED ORDER — HYDROMORPHONE HCL PF 1 MG/ML IJ SOLN
0.5000 mg | INTRAMUSCULAR | Status: DC | PRN
  Administered 2013-10-03: 1 mg via INTRAVENOUS
  Filled 2013-10-03: qty 1

## 2013-10-03 MED ORDER — EPHEDRINE SULFATE 50 MG/ML IJ SOLN
INTRAMUSCULAR | Status: DC | PRN
  Administered 2013-10-03 (×2): 10 mg via INTRAVENOUS

## 2013-10-03 MED ORDER — DEXTROSE 50 % IV SOLN
INTRAVENOUS | Status: AC
  Filled 2013-10-03: qty 50

## 2013-10-03 MED ORDER — GLYCOPYRROLATE 0.2 MG/ML IJ SOLN
INTRAMUSCULAR | Status: AC
  Filled 2013-10-03: qty 2

## 2013-10-03 MED ORDER — LIDOCAINE HCL (CARDIAC) 20 MG/ML IV SOLN
INTRAVENOUS | Status: AC
  Filled 2013-10-03: qty 5

## 2013-10-03 MED ORDER — NEOSTIGMINE METHYLSULFATE 1 MG/ML IJ SOLN
INTRAMUSCULAR | Status: AC
  Filled 2013-10-03: qty 10

## 2013-10-03 MED ORDER — MIDAZOLAM HCL 2 MG/2ML IJ SOLN
INTRAMUSCULAR | Status: AC
  Filled 2013-10-03: qty 2

## 2013-10-03 MED ORDER — ROCURONIUM BROMIDE 50 MG/5ML IV SOLN
INTRAVENOUS | Status: AC
  Filled 2013-10-03: qty 1

## 2013-10-03 MED ORDER — ONDANSETRON HCL 4 MG/2ML IJ SOLN: 4.0000 mg | Freq: Four times a day (QID) | INTRAMUSCULAR | Status: DC | PRN

## 2013-10-03 MED ORDER — BUPIVACAINE HCL (PF) 0.75 % IJ SOLN
INTRAMUSCULAR | Status: DC | PRN
  Administered 2013-10-03: 1 mL via INTRATHECAL

## 2013-10-03 MED ORDER — ONDANSETRON HCL 4 MG/2ML IJ SOLN
INTRAMUSCULAR | Status: AC
  Filled 2013-10-03: qty 2

## 2013-10-03 MED ORDER — CHLORHEXIDINE GLUCONATE 4 % EX LIQD: 60.0000 mL | Freq: Once | CUTANEOUS | Status: DC

## 2013-10-03 MED ORDER — METFORMIN HCL 500 MG PO TABS
500.0000 mg | ORAL_TABLET | Freq: Every day | ORAL | Status: DC
  Filled 2013-10-03 (×2): qty 1

## 2013-10-03 MED ORDER — PHENYLEPHRINE HCL 10 MG/ML IJ SOLN
INTRAMUSCULAR | Status: DC | PRN
  Administered 2013-10-03 (×2): 80 ug via INTRAVENOUS
  Administered 2013-10-03 (×2): 120 ug via INTRAVENOUS

## 2013-10-03 MED ORDER — ALBUTEROL SULFATE (2.5 MG/3ML) 0.083% IN NEBU
2.5000 mg | INHALATION_SOLUTION | Freq: Four times a day (QID) | RESPIRATORY_TRACT | Status: DC | PRN
  Administered 2013-10-03: 2.5 mg via RESPIRATORY_TRACT
  Filled 2013-10-03: qty 3

## 2013-10-03 SURGICAL SUPPLY — 40 items
BIT DRILL 4.3MMS DISTAL GRDTED (BIT) IMPLANT
BLADE SURG ROTATE 9660 (MISCELLANEOUS) ×3 IMPLANT
CORTICAL BONE SCR 5.0MM X 46MM (Screw) ×3 IMPLANT
COVER SURGICAL LIGHT HANDLE (MISCELLANEOUS) ×6 IMPLANT
DECANTER SPIKE VIAL GLASS SM (MISCELLANEOUS) ×3 IMPLANT
DRAPE STERI IOBAN 125X83 (DRAPES) ×3 IMPLANT
DRILL 4.3MMS DISTAL GRADUATED (BIT) ×6
DRSG MEPILEX BORDER 4X4 (GAUZE/BANDAGES/DRESSINGS) ×7 IMPLANT
DRSG MEPILEX BORDER 4X8 (GAUZE/BANDAGES/DRESSINGS) ×1 IMPLANT
DURAPREP 26ML APPLICATOR (WOUND CARE) ×3 IMPLANT
ELECT CAUTERY BLADE 6.4 (BLADE) ×3 IMPLANT
ELECT REM PT RETURN 9FT ADLT (ELECTROSURGICAL) ×3
ELECTRODE REM PT RTRN 9FT ADLT (ELECTROSURGICAL) ×1 IMPLANT
EVACUATOR 1/8 PVC DRAIN (DRAIN) IMPLANT
GAUZE XEROFORM 5X9 LF (GAUZE/BANDAGES/DRESSINGS) ×3 IMPLANT
GLOVE BIOGEL PI IND STRL 8 (GLOVE) ×2 IMPLANT
GLOVE BIOGEL PI INDICATOR 8 (GLOVE) ×4
GLOVE ECLIPSE 7.5 STRL STRAW (GLOVE) ×6 IMPLANT
GOWN STRL REUS W/ TWL LRG LVL3 (GOWN DISPOSABLE) ×1 IMPLANT
GOWN STRL REUS W/ TWL XL LVL3 (GOWN DISPOSABLE) ×2 IMPLANT
GOWN STRL REUS W/TWL LRG LVL3 (GOWN DISPOSABLE) ×3
GOWN STRL REUS W/TWL XL LVL3 (GOWN DISPOSABLE) ×6
GUIDEWIRE BALL NOSE 100CM (WIRE) ×4 IMPLANT
KIT BASIN OR (CUSTOM PROCEDURE TRAY) ×3 IMPLANT
KIT ROOM TURNOVER OR (KITS) ×3 IMPLANT
MANIFOLD NEPTUNE II (INSTRUMENTS) ×3 IMPLANT
NAIL AFFIXUS 13X380 (Nail) ×2 IMPLANT
NS IRRIG 1000ML POUR BTL (IV SOLUTION) ×3 IMPLANT
PACK GENERAL/GYN (CUSTOM PROCEDURE TRAY) ×3 IMPLANT
PAD ARMBOARD 7.5X6 YLW CONV (MISCELLANEOUS) ×6 IMPLANT
SCREW BONE CORTICAL 5.0X50 (Screw) ×2 IMPLANT
SCREW CORTICL BON 5.0MM X 46MM (Screw) IMPLANT
SCREW LAG HIP NAIL 10.5X95 (Screw) ×2 IMPLANT
STAPLER VISISTAT 35W (STAPLE) ×3 IMPLANT
SUT VIC AB 0 CTB1 27 (SUTURE) ×6 IMPLANT
SUT VIC AB 1 CTB1 27 (SUTURE) ×3 IMPLANT
SUT VIC AB 2-0 CTB1 (SUTURE) ×3 IMPLANT
TOWEL OR 17X24 6PK STRL BLUE (TOWEL DISPOSABLE) ×3 IMPLANT
TOWEL OR 17X26 10 PK STRL BLUE (TOWEL DISPOSABLE) ×3 IMPLANT
WATER STERILE IRR 1000ML POUR (IV SOLUTION) ×3 IMPLANT

## 2013-10-03 NOTE — H&P (Signed)
Alan [name] is an 80 y.o. male.  HPI: the patient is an 80 yo male with known lung Ca.  He has impending fracture r femur and we are consulted to manage.  Pt has pain on all rom and wt bearing currently  Past Medical History  Diagnosis Date  . AAA (abdominal aortic aneurysm)   . Bronchitis   . Acute sinusitis     10/03/12   . History of kidney stones     many yrs ago  . History of radiation therapy 11/24/2012-11/30/2012    9 gray to left lung/mediastinum  . Hypertension     borderline   . DM (diabetes mellitus) type 2 09/18/2012  . colon ca dx'd 10/2012    colon cancer s/p surgery 4'14, skin cancer  . 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 Metastasis to bone     right mandible mass  . Anemia requiring transfusions 11-16-12    last 10-18-12 s/p Partial colectomy  . History of radiation therapy 03/01/13-03/15/13    right posterior mandibular  . Lung cancer   . Hx of radiation therapy 11/24/12- 11/30/12    left main stem bronchus, surrounding involved lung/mediastinum, 9 Gy 5 fx  . History of radiation therapy 06/21/13-07/06/13    lymphadenopathy b/l neck 30Gy/86f  . Shortness of breath     with humidity or gets upset  . COPD (chronic obstructive pulmonary disease)     wears O2 every night and some during the day  . Pneumonia     Past Surgical History  Procedure Laterality Date  . Scrotum exploration    . Removal of abcess  2006    pt. states abcess was located behind his tonsils  . Laparoscopic partial colectomy N/A 10/12/2012    Procedure: LAPAROSCOPIC PARTIAL COLECTOMY;  Surgeon: TOdis Hollingshead MD;  Location: [facility]S;  Service: General;  Laterality: N/A;  . Colon surgery    . Abscess removed from throat    . Cataract extraction, bilateral    . Endobronchial ultrasound Bilateral 11/20/2012    Procedure: ENDOBRONCHIAL ULTRASOUND;  Surgeon: RCollene Gobble MD;  Location: [facility];  Service: Cardiopulmonary;  Laterality: Bilateral;   . Bronchoscopy      Family History  Problem Relation Age of Onset  . Heart disease Father   . Heart disease Mother     Social History:  reports that he quit smoking about 12 years ago. His smoking use included Cigarettes. He has a 90 pack-year smoking history. He quit smokeless tobacco use about 11 years ago. His smokeless tobacco use included Chew. He reports that he does not drink alcohol or use illicit drugs.  Allergies:  Allergies  Allergen Reactions  . Sulfa Antibiotics Anaphylaxis  . Xyzal [Levocetirizine] Shortness Of Breath  . Pseudoephedrine     stops pt from urinating  . Amoxicillin Rash    Medications: I have reviewed the patient's current medications.  No results found for this or any previous visit (from the past 48 hour(s)).  No results found.  ROS ROS: I have reviewed the patient's review of systems thoroughly and there are no positive responses as relates to the HPI. EXAM: There were no vitals taken for this visit. Physical Exam Filed Vitals:   10/03/13 1352  BP: 127/71  Pulse: 105  Temp: 98.4 F (36.9 C)  Resp: 18   Well-developed well-nourished patient in no acute distress. Alert and oriented x3 HEENT:within  normal limits Cardiac: Regular rate and rhythm Pulmonary: Lungs clear to auscultation Abdomen: Soft and nontender.  Normal active bowel sounds  Musculoskeletal: (r leg pain on rom amd walking)  XRAY: 2X4 cm lesion r femur proximally Recent Results (from the past 2160 hour(s))  GLUCOSE, CAPILLARY     Status: None   Collection Time    10/03/13  1:51 PM      Result Value Ref Range   Glucose-Capillary 85  70 - 99 mg/dL  CBC     Status: Abnormal   Collection Time    10/03/13  1:53 PM      Result Value Ref Range   WBC 6.6  4.0 - 10.5 K/uL   RBC 3.67 (*) 4.22 - 5.81 MIL/uL   Hemoglobin 10.2 (*) 13.0 - 17.0 g/dL   HCT 31.7 (*) 39.0 - 52.0 %   MCV 86.4  78.0 - 100.0 fL   MCH 27.8  26.0 - 34.0 pg   MCHC 32.2  30.0 - 36.0 g/dL   RDW 15.2   11.5 - 15.5 %   Platelets 268  150 - 400 K/uL  BASIC METABOLIC PANEL     Status: Abnormal   Collection Time    10/03/13  1:53 PM      Result Value Ref Range   Sodium 134 (*) 137 - 147 mEq/L   Potassium 3.6 (*) 3.7 - 5.3 mEq/L   Chloride 92 (*) 96 - 112 mEq/L   CO2 28  19 - 32 mEq/L   Glucose, Bld 89  70 - 99 mg/dL   BUN 10  6 - 23 mg/dL   Creatinine, Ser 0.42 (*) 0.50 - 1.35 mg/dL   Calcium 9.5  8.4 - 10.5 mg/dL   GFR calc non Af Amer >90  >90 mL/min   GFR calc Af Amer >90  >90 mL/min   Comment: (NOTE)     The eGFR has been calculated using the CKD EPI equation.     This calculation has not been validated in all clinical situations.     eGFR's persistently <90 mL/min signify possible Chronic Kidney     Disease.  GLUCOSE, CAPILLARY     Status: None   Collection Time    10/03/13  3:00 PM      Result Value Ref Range   Glucose-Capillary 77  70 - 99 mg/dL  GLUCOSE, CAPILLARY     Status: Abnormal   Collection Time    10/03/13  3:48 PM      Result Value Ref Range   Glucose-Capillary 69 (*) 70 - 99 mg/dL   Assessment/Plan: 80 yo male with impending fracture r leg with known metestatic lung ca// Pt neeeds prophylactic femoral rodding.  Pt and family aware of risks and benefits of surgery and wish to preoceed  Alan [name] L 10/03/2013, 1:52 PM

## 2013-10-03 NOTE — Brief Op Note (Signed)
10/03/2013  5:40 PM  PATIENT:  Alan [name]  80 y.o. male  PRE-OPERATIVE DIAGNOSIS:  IMPENDING FEMUR FRACTURE  POST-OPERATIVE DIAGNOSIS:  IMPENDING FEMUR FRACTURE  PROCEDURE:  Procedure(s): INTRAMEDULLARY (IM) NAIL FEMORAL (Right)  SURGEON:  Surgeon(s) and Role:    * Alta Corning, MD - Primary  PHYSICIAN ASSISTANT:   ASSISTANTS: bethune   ANESTHESIA:   general  EBL:  Total I/O In: -  Out: 150 [Blood:150]  BLOOD ADMINISTERED:none  DRAINS: none   LOCAL MEDICATIONS USED:  NONE  SPECIMEN:  Source of Specimen:  femoral reamings  DISPOSITION OF SPECIMEN:  PATHOLOGY  COUNTS:  YES  TOURNIQUET:  * No tourniquets in log *  DICTATION: .Other Dictation: Dictation Number [id number]  PLAN OF CARE: Admit for overnight observation  PATIENT DISPOSITION:  PACU - hemodynamically stable.   Delay start of Pharmacological VTE agent (>24hrs) due to surgical blood loss or risk of bleeding: no

## 2013-10-03 NOTE — Anesthesia Procedure Notes (Signed)
Spinal  Patient location during procedure: OR Start time: 10/03/2013 4:35 PM End time: 10/03/2013 4:43 PM Staffing Anesthesiologist: Emmerson Shuffield Preanesthetic Checklist Completed: patient identified, site marked, surgical consent, pre-op evaluation, timeout performed, IV checked, risks and benefits discussed and monitors and equipment checked Spinal Block Patient position: sitting Prep: Betadine Patient monitoring: heart rate, cardiac monitor, continuous pulse ox and blood pressure Approach: midline Location: L3-4 Injection technique: single-shot Needle Needle type: Quincke  Needle gauge: 22 G Needle length: 9 cm Assessment Sensory level: T3 Additional Notes Pt tolerated the procedure well.  1.0 mL of 0.75% Bupivacaine was injected intrathecally.

## 2013-10-03 NOTE — OR Nursing (Signed)
Ok for pt to go to 6N if no 5N bed available per Gaspar Skeeters PA.

## 2013-10-03 NOTE — Transfer of Care (Signed)
Immediate Anesthesia Transfer of Care Note  Patient: Alan [name]  Procedure(s) Performed: Procedure(s): INTRAMEDULLARY (IM) NAIL FEMORAL (Right)  Patient Location: PACU  Anesthesia Type:Spinal  Level of Consciousness: awake, alert  and oriented  Airway & Oxygen Therapy: Patient Spontanous Breathing  Post-op Assessment: Report given to PACU RN and Post -op Vital signs reviewed and stable  Post vital signs: Reviewed and stable  Complications: No apparent anesthesia complications

## 2013-10-03 NOTE — Anesthesia Postprocedure Evaluation (Signed)
  Anesthesia Post-op Note  Patient: Alan [name]  Procedure(s) Performed: Procedure(s): INTRAMEDULLARY (IM) NAIL FEMORAL (Right)  Patient Location: PACU  Anesthesia Type:Spinal  Level of Consciousness: awake, alert  and oriented  Airway and Oxygen Therapy: Patient Spontanous Breathing  Post-op Pain: none  Post-op Assessment: Post-op Vital signs reviewed, Patient's Cardiovascular Status Stable and Respiratory Function Stable  Post-op Vital Signs: Reviewed and stable  Complications: No apparent anesthesia complications

## 2013-10-03 NOTE — Discharge Instructions (Signed)
You may weight bear as tolerated on your right leg. Use walker for balance as needed. Keep your incisions dry until staples are removed in 2 weeks. You can apply waterproof bandaids to shower if desired.

## 2013-10-03 NOTE — Anesthesia Preprocedure Evaluation (Signed)
Anesthesia Evaluation  Patient identified by MRN, date of birth, ID band Patient awake    Reviewed: Allergy & Precautions, H&P , NPO status , Patient's Chart, lab work & pertinent test results  Airway Mallampati: II  Neck ROM: full    Dental   Pulmonary shortness of breath, COPDformer smoker,  H/o lung CA. Now with bone mets         Cardiovascular hypertension, + Peripheral Vascular Disease     Neuro/Psych    GI/Hepatic H/o colon CA   Endo/Other  diabetes  Renal/GU      Musculoskeletal   Abdominal   Peds  Hematology   Anesthesia Other Findings   Reproductive/Obstetrics                           Anesthesia Physical Anesthesia Plan  ASA: III  Anesthesia Plan: Spinal   Post-op Pain Management:    Induction: Intravenous  Airway Management Planned: Simple Face Mask  Additional Equipment:   Intra-op Plan:   Post-operative Plan:   Informed Consent: I have reviewed the patients History and Physical, chart, labs and discussed the procedure including the risks, benefits and alternatives for the proposed anesthesia with the patient or authorized representative who has indicated his/her understanding and acceptance.     Plan Discussed with: CRNA, Anesthesiologist and Surgeon  Anesthesia Plan Comments:         Anesthesia Quick Evaluation

## 2013-10-03 NOTE — Preoperative (Signed)
Beta Blockers   Reason not to administer Beta Blockers:Not Applicable 

## 2013-10-04 LAB — CBC
HCT: 26.2 % — ABNORMAL LOW (ref 39.0–52.0)
HEMOGLOBIN: 8.5 g/dL — AB (ref 13.0–17.0)
MCH: 28 pg (ref 26.0–34.0)
MCHC: 32.4 g/dL (ref 30.0–36.0)
MCV: 86.2 fL (ref 78.0–100.0)
Platelets: 240 10*3/uL (ref 150–400)
RBC: 3.04 MIL/uL — AB (ref 4.22–5.81)
RDW: 15.5 % (ref 11.5–15.5)
WBC: 10.3 10*3/uL (ref 4.0–10.5)

## 2013-10-04 LAB — GLUCOSE, CAPILLARY
Glucose-Capillary: 108 mg/dL — ABNORMAL HIGH (ref 70–99)
Glucose-Capillary: 133 mg/dL — ABNORMAL HIGH (ref 70–99)

## 2013-10-04 NOTE — Progress Notes (Signed)
Subjective: 1 Day Post-Op Procedure(s) (LRB): INTRAMEDULLARY (IM) NAIL FEMORAL (Right) Patient reports pain as mild.    Objective: Vital signs in last 24 hours: Temp:  [94.3 F (34.6 C)-98.4 F (36.9 C)] 98.2 F (36.8 C) (03/26 0604) Pulse Rate:  [103-118] 117 (03/26 0604) Resp:  [18-32] 20 (03/26 0604) BP: (83-127)/(43-74) 113/43 mmHg (03/26 0604) SpO2:  [91 %-100 %] 96 % (03/26 0604) FiO2 (%):  [2 %] 2 % (03/26 0058) Weight:  [152 lb (68.947 kg)] 152 lb (68.947 kg) (03/25 2000)  Intake/Output from previous day: 03/25 0701 - 03/26 0700 In: 2300 [P.O.:120; I.V.:2080; IV Piggyback:100] Out: 150 [Blood:150] Intake/Output this shift:     Recent Labs  10/03/13 1353 10/04/13 0543  HGB 10.2* 8.5*    Recent Labs  10/03/13 1353 10/04/13 0543  WBC 6.6 10.3  RBC 3.67* 3.04*  HCT 31.7* 26.2*  PLT 268 240    Recent Labs  10/03/13 1353  NA 134*  K 3.6*  CL 92*  CO2 28  BUN 10  CREATININE 0.42*  GLUCOSE 89  CALCIUM 9.5   No results found for this basename: LABPT, INR,  in the last 72 hours  Neurologically intact ABD soft Neurovascular intact Sensation intact distally Intact pulses distally Dorsiflexion/Plantar flexion intact No cellulitis present Compartment soft  Assessment/Plan: 1 Day Post-Op Procedure(s) (LRB): INTRAMEDULLARY (IM) NAIL FEMORAL (Right) Advance diet Up with therapy Discharge home with home health  Alan [name] L 10/04/2013, 8:30 AM

## 2013-10-04 NOTE — Evaluation (Signed)
Physical Therapy Evaluation Patient Details Name: Alan [name] MRN: [id number] DOB: 03-31-1932 Today's Date: 10/04/2013   History of Present Illness  Pt. admitted for impending femur fx and underwent IM nail R femur.  Pt. with h/o metastatic lung CA  Clinical Impression  Pt. Presents to PT s/p IM nail for impending fracture and known metastatic disease.  He tolerated mobility unusually well but was somewhat SOB even on O2.  He had cool fingers so I could not get an accurate O2 sat with his activity.  Once he sat down in recliner, he quickly recovered back to his pre-walk baseline.  I educated his wife on negotiating the steps with him and she demonstrated a clear understanding.  I recommend a PT home safety eval to assure safety while mobilizing within the home, and he needs a RW and 3n1.  Communicated this to [employer] and she will arrange with CM.  I will sign off as he is [place] home this am.    Follow Up Recommendations Home health PT;Supervision/Assistance - 24 hour    Equipment Recommendations  Rolling walker with 5" wheels;3in1 (PT)    Recommendations for Other Services       Precautions / Restrictions Precautions Precautions: Fall Restrictions Weight Bearing Restrictions: Yes RLE Weight Bearing: Weight bearing as tolerated      Mobility  Bed Mobility Overal bed mobility:  (not assessed, pt. in chair but denies difficulty with OOB)                Transfers Overall transfer level: Needs assistance Equipment used: Rolling walker (2 wheeled) Transfers: Sit to/from Stand Sit to Stand: Supervision         General transfer comment: supervision for safety and initial verbal cues for safe techniuqe and hand placement  Ambulation/Gait Ambulation/Gait assistance: Modified independent (Device/Increase time) (assist to manage lines and tubes only) Ambulation Distance (Feet): 40 Feet Assistive device: Rolling walker (2 wheeled) Gait Pattern/deviations: Step-through  pattern     General Gait Details: pt. ambulated at mod I level with management for O2 and IV tubing only.  Pain is well managed at this time and not limiting his mobility.  He did feel winded so was assisted to sitting in recliner chair.  I was unable to obtain his sat suspect due to cool fingers and monitor would not register.   Stairs Stairs: Yes   Stair Management: One rail Left;Forwards;Step to pattern Number of Stairs: 4 General stair comments: Pt. felt too winded to transport to steps so I took his wife to the stairwell and trained her  on use of step to pattern , up with good and down with "bad" leg and use of L handrail.  She understood and could demo back  Wheelchair Mobility    Modified Rankin (Stroke Patients Only)       Balance Overall balance assessment: Modified Independent                                   Pertinent Vitals/Pain See vitals tab No pain, SOB with walking 40 feet, however quick recovery once seated.  Could not obtain O2 sats in a timely manner following walking due to cool fingers.    Home Living Family/patient expects to be discharged to:: Private residence Living Arrangements: Spouse/significant other Available Help at Discharge: Family;Available 24 hours/day Type of Home: House Home Access: Stairs to enter Entrance Stairs-Rails: Left Entrance Stairs-Number of Steps:  5 Home Layout: One level Home Equipment: None      Prior Function Level of Independence: Independent               Hand Dominance        Extremity/Trunk Assessment   Upper Extremity Assessment: Overall WFL for tasks assessed           Lower Extremity Assessment: Overall WFL for tasks assessed;RLE deficits/detail RLE Deficits / Details: pt. able to ankle pump and quad set well on R side; can complete LAQ without difficulty    Cervical / Trunk Assessment: Kyphotic  Communication   Communication: No difficulties  Cognition Arousal/Alertness:  Awake/alert Behavior During Therapy: WFL for tasks assessed/performed Overall Cognitive Status: Within Functional Limits for tasks assessed                      General Comments      Exercises General Exercises - Lower Extremity Ankle Circles/Pumps: AROM;Both;10 reps Quad Sets: AROM;Right;10 reps Long Arc Quad: AROM;Right;10 reps      Assessment/Plan    PT Assessment All further PT needs can be met in the next venue of care  PT Diagnosis     PT Problem List    PT Treatment Interventions     PT Goals (Current goals can be found in the Care Plan section)      Frequency     Barriers to discharge        End of Session Equipment Utilized During Treatment: Gait belt;Oxygen Activity Tolerance: Patient tolerated treatment well;Patient limited by fatigue (limited by SOB) Patient left: in chair;with call bell/phone within reach;with family/visitor present    Functional Assessment Tool Used: clinical observation/judgement Functional Limitation: Mobility: Walking and moving around Mobility: Walking and Moving Around Current Status ([id number]): At least 1 percent but less than 20 percent impaired, limited or restricted Mobility: Walking and Moving Around Goal Status [phone]): At least 1 percent but less than 20 percent impaired, limited or restricted Mobility: Walking and Moving Around Discharge Status [phone]): At least 1 percent but less than 20 percent impaired, limited or restricted    Time: 0830-0858 PT Time Calculation (min): 28 min   Charges:   PT Evaluation $Initial PT Evaluation Tier I: 1 Procedure PT Treatments $Gait Training: 8-22 mins   PT G Codes:   Functional Assessment Tool Used: clinical observation/judgement Functional Limitation: Mobility: Walking and moving around    [place] 10/04/2013, 9:12 AM Gerlean Ren PT Acute Rehab Services [phone] [place] [phone]

## 2013-10-04 NOTE — Op Note (Signed)
[name], [name]             ACCOUNT NO.:  [id number]  MEDICAL RECORD NO.:  [id number]  LOCATION:  [id number]                        FACILITY:  [facility]  PHYSICIAN:  Alta Corning, M.D.   DATE OF BIRTH:  1932/02/12  DATE OF PROCEDURE:  10/03/2013 DATE OF DISCHARGE:                              OPERATIVE REPORT   PREOPERATIVE DIAGNOSIS:  Impending pathologic fracture, right femur.  POSTOPERATIVE DIAGNOSIS:  Impending pathologic fracture, right femur.  PROCEDURE:  Prophylactic intramedullary rodding, right femur, in a patient with known lung cancer.  BRIEF HISTORY:  [name] is an 80 year old male with a history of having metastatic lung cancer.  He had been treated with radiation in the right leg previously, but began having worsening pain in the right leg.  X-rays were taken, which showed that he had a lytic defect in the mid shaft of the femur on the right side.  His oncologist Dr. __________ contacted me and felt that prophylactic intramedullary nailing would be appropriate.  We evaluated the patient and felt that this was in fact the case, and after a long discussion of treatment options including the understanding that he could have respiratory issues that he could have problems of bleeding, infection, and failure of the surgery, he ultimately elected to undergo this so that he could be able to get up and walk with minimizing pain, and he was brought to the operating room for this procedure.  DESCRIPTION OF PROCEDURE:  The patient was brought to the operating room.  After adequate anesthesia was obtained with general anesthetic, the patient was placed supine on the operating table.  He was then placed into the fracture table.  The right leg was prepped and draped in usual sterile fashion.  Following this, a small incision was made just proximal to the trochanter.  The subcutaneous tissue at the level of the trochanter and a guidewire was placed to the tip of the trochanter  into the central portion of the canal on both AP and lateral fluoro.  Once this was completed, the canal was opened with a canal Finder and then a guidewire was placed down and the length was measured to be 38 cm.  We then started reaming at no chatter up until really 13.5 or 14.  We reamed it to a 15 and then used 13 x 38 rod, locked it with a large lag screw up into the head because of the concern over just any issues related to pathologic fracture.  We felt __________ lock up into the head.  This was done with a 95 mm lag screw distally.  We locked it with 2 interlocking screws in standard fashion.  Once this was done, the wounds were copiously irrigated, suctioned dry, closed in layers. Sterile compressive dressing was  applied.  The patient was taken to the recovery and was noted to be in satisfactory condition.  Estimated blood loss during the procedure was 150 mL.     Alta Corning, M.D.    Corliss Skains  D:  10/03/2013  T:  10/04/2013  Job:  [id number]

## 2013-10-04 NOTE — Discharge Summary (Signed)
Patient ID: Alan [name] MRN: [id number] DOB/AGE: Jun 02, 1932 80 y.o.  Admit date: 10/03/2013 Discharge date: 10/04/2013  Admission Diagnoses:  Principal Problem:   Pathological fracture of shaft of right femur   Discharge Diagnoses:  Same  Past Medical History  Diagnosis Date  . AAA (abdominal aortic aneurysm)   . Bronchitis   . Acute sinusitis     10/03/12   . History of kidney stones     many yrs ago  . History of radiation therapy 11/24/2012-11/30/2012    9 gray to left lung/mediastinum  . Hypertension     borderline   . DM (diabetes mellitus) type 2 09/18/2012  . colon ca dx'd 10/2012    colon cancer s/p surgery 4'14, skin cancer  . 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 Metastasis to bone     right mandible mass  . Anemia requiring transfusions 11-16-12    last 10-18-12 s/p Partial colectomy  . History of radiation therapy 03/01/13-03/15/13    right posterior mandibular  . Lung cancer   . Hx of radiation therapy 11/24/12- 11/30/12    left main stem bronchus, surrounding involved lung/mediastinum, 9 Gy 5 fx  . History of radiation therapy 06/21/13-07/06/13    lymphadenopathy b/l neck 30Gy/45fx  . Shortness of breath     with humidity or gets upset  . COPD (chronic obstructive pulmonary disease)     wears O2 every night and some during the day  . Pneumonia     Surgeries: Procedure(s):Right INTRAMEDULLARY (IM) NAIL FEMORAL on 10/03/2013   Consultants:    Discharged Condition: Improved  Hospital Course: Alan [name] is an 80 y.o. male who was admitted 10/03/2013 for operative treatment ofPathological fracture of shaft of right femur. Patient has severe unremitting pain that affects sleep, daily activities, and work/hobbies. After pre-op clearance the patient was taken to the operating room on 10/03/2013 and underwent  Procedure(s):Right INTRAMEDULLARY (IM) NAIL FEMORAL.    Patient was given perioperative antibiotics:  Anti-infectives   Start     Dose/Rate Route Frequency Ordered Stop   10/03/13 2000  clindamycin (CLEOCIN) IVPB 600 mg     600 mg 100 mL/hr over 30 Minutes Intravenous Every 6 hours 10/03/13 1916 10/04/13 0834   10/03/13 1430  clindamycin (CLEOCIN) IVPB 900 mg     900 mg 100 mL/hr over 30 Minutes Intravenous On call to O.R. 10/03/13 1359 10/03/13 1640       Patient was given sequential compression devices, early ambulation, and chemoprophylaxis to prevent DVT.  Patient benefited maximally from hospital stay and there were no complications.    Recent vital signs: Patient Vitals for the past 24 hrs:  BP Temp Temp src Pulse Resp SpO2 Height Weight  10/04/13 0604 113/43 mmHg 98.2 F (36.8 C) Oral 117 20 96 % - -  10/04/13 0058 95/49 mmHg 97.9 F (36.6 C) Oral 118 20 93 % - -  10/03/13 2116 90/50 mmHg - - - - - - -  10/03/13 2043 - 94.3 F (34.6 C) Oral 109 20 95 % - -  10/03/13 2000 - - - - - - 5\' 5"  (1.651 m) 152 lb (68.947 kg)  10/03/13 1907 103/59 mmHg - - 108 22 98 % - -  10/03/13 1845 122/74 mmHg - - 103 27 97 % - -  10/03/13 1830 - - - 105 19 95 % - -  10/03/13 1815 103/55 mmHg - - 104 32  96 % - -  10/03/13 1800 86/48 mmHg - - 109 27 91 % - -  10/03/13 1750 83/51 mmHg 97.3 F (36.3 C) - 109 27 100 % - -  10/03/13 1352 127/71 mmHg 98.4 F (36.9 C) Oral 105 18 96 % - -     Recent laboratory studies:  Recent Labs  10/03/13 1353 10/04/13 0543  WBC 6.6 10.3  HGB 10.2* 8.5*  HCT 31.7* 26.2*  PLT 268 240  NA 134*  --   K 3.6*  --   CL 92*  --   CO2 28  --   BUN 10  --   CREATININE 0.42*  --   GLUCOSE 89  --   CALCIUM 9.5  --      Discharge Medications:     Medication List    TAKE these medications       albuterol (2.5 MG/3ML) 0.083% nebulizer solution  Commonly known as:  PROVENTIL  Take 2.5 mg by nebulization every 6 (six) hours as needed for wheezing or shortness of breath.     albuterol 108 (90 BASE) MCG/ACT inhaler  Commonly known as:  PROVENTIL  HFA;VENTOLIN HFA  Inhale 2 puffs into the lungs every 6 (six) hours as needed for wheezing.     aspirin EC 325 MG tablet  Take 1 tablet (325 mg total) by mouth daily after breakfast. Take x 2 weeks post op to decrease risk of blood clots.     HYDROcodone-acetaminophen 5-325 MG per tablet  Commonly known as:  NORCO  Take 1-2 tablets by mouth every 6 (six) hours as needed for severe pain.     metFORMIN 500 MG tablet  Commonly known as:  GLUCOPHAGE  Take 500 mg by mouth daily with breakfast.      ASK your doctor about these medications       budesonide-formoterol 160-4.5 MCG/ACT inhaler  Commonly known as:  SYMBICORT  Inhale 2 puffs into the lungs 2 (two) times daily.  Ask about: Which instructions should I use?     fluticasone 50 MCG/ACT nasal spray  Commonly known as:  FLONASE  Place 2 sprays into both nostrils daily.  Ask about: Which instructions should I use?     lisinopril 5 MG tablet  Commonly known as:  PRINIVIL,ZESTRIL  Take 5 mg by mouth every morning.  Ask about: Which instructions should I use?     loratadine 10 MG tablet  Commonly known as:  CLARITIN  Take 10 mg by mouth every morning.  Ask about: Which instructions should I use?     tamsulosin 0.4 MG Caps capsule  Commonly known as:  FLOMAX  Take 0.4 mg by mouth daily after breakfast.  Ask about: Which instructions should I use?     tiotropium 18 MCG inhalation capsule  Commonly known as:  SPIRIVA  Place 18 mcg into inhaler and inhale daily.  Ask about: Which instructions should I use?        Diagnostic Studies: Dg Chest 2 View  10/03/2013   CLINICAL DATA:  Short of breath, pneumonia, history colon cancer  EXAM: CHEST  2 VIEW  COMPARISON:  CT ANGIO CHEST W/CM &/OR WO/CM dated 12/27/2012; NM PET IMAGE INITIAL (PI) SKULL BASE TO THIGH dated 12/05/2012; DG CHEST 1V PORT dated 12/26/2012  FINDINGS: There are 2 new nodules in the right lung measures 11 mm and 10 mm. Additional left upper lobe nodule measuring 6  mm.  Central bronchitic markings. No clear pneumonia evident. Lungs are  hyperinflated. Normal cardiac silhouette  IMPRESSION: 1. Concern for new pulmonary metastasis. Recommend non emergent CT thorax with contrast. 2. Chronic bronchitic markings centrally. 3. No clear evidence pneumonia.   Electronically Signed   By: Suzy Bouchard M.D.   On: 10/03/2013 14:32   Dg Hip Complete Right  09/28/2013   CLINICAL DATA:  Right femur pain for 1 week, history lung cancer  EXAM: RIGHT HIP - COMPLETE 2+ VIEW  COMPARISON:  None  FINDINGS: Bones appear demineralized.  Hip joint space preserved.  No acute fracture or dislocation.  Cortical destruction identified at the medial aspect of the proximal femoral diaphysis, beyond extent of this exam.  IMPRESSION: Cortical lytic destructive lesion at the proximal femoral diaphysis at margin exam extending beyond extent of study, highly suspicious for cortical metastasis, please refer to report of right femoral radiographs.   Electronically Signed   By: Lavonia Dana M.D.   On: 09/28/2013 13:41   Dg Femur Right  10/03/2013   CLINICAL DATA:  Intramedullary nail placement.  EXAM: RIGHT FEMUR - 2 VIEW; DG C-ARM 1-60 MIN  COMPARISON:  DG FEMUR*R* dated 09/28/2013  FINDINGS: Five spot fluoroscopic images demonstrate the placement of a dynamic compression screw and intramedullary nail in the right femur. The proximal diaphyseal lytic lesion is incompletely visualized. No complications are identified.  IMPRESSION: Right femur fixation without demonstrated complication.   Electronically Signed   By: Camie Patience M.D.   On: 10/03/2013 18:34   Dg Femur Right  09/28/2013   CLINICAL DATA:  Right femoral pain for 1 week, history lung cancer  EXAM: RIGHT FEMUR - 2 VIEW  COMPARISON:  PET-CT 12/05/2012  FINDINGS: 8 cm length area of cortical destruction identified at the medial aspect of the proximal right femoral diaphysis compatible with a large osseous metastasis.  Bones appear diffusely  demineralized.  Hip joint space preserved.  Degenerative changes right knee with joint space narrowing.  No fracture, dislocation or additional bone destruction.  Scattered atherosclerotic calcification.  IMPRESSION: 8 cm length lytic cortical destructive lesion at the medial aspect of the proximal right femoral diaphysis consistent with an osseous metastasis.  This lesion is at risk for pathologic fracture.  Findings called to Amy RN at the [facility] on 09/28/2013 at 1345 hr.   Electronically Signed   By: Lavonia Dana M.D.   On: 09/28/2013 13:48   Dg C-arm 1-60 Min  10/03/2013   CLINICAL DATA:  Intramedullary nail placement.  EXAM: RIGHT FEMUR - 2 VIEW; DG C-ARM 1-60 MIN  COMPARISON:  DG FEMUR*R* dated 09/28/2013  FINDINGS: Five spot fluoroscopic images demonstrate the placement of a dynamic compression screw and intramedullary nail in the right femur. The proximal diaphyseal lytic lesion is incompletely visualized. No complications are identified.  IMPRESSION: Right femur fixation without demonstrated complication.   Electronically Signed   By: Camie Patience M.D.   On: 10/03/2013 18:34    Disposition: 01-Home or Self Care       Future Appointments Provider Department Dept Phone   10/26/2013 12:30 PM [name], MD [facility] [phone]    WBAT on right leg with a walker as needed.  Follow-up Information   Follow up with Lorea Kupfer L, MD. Schedule an appointment as soon as possible for a visit in 2 weeks.   Specialty:  Orthopedic Surgery   Contact information:   [place] [address] [phone]        Signed: Alta Corning 10/04/2013, 8:50  AM    

## 2013-10-04 NOTE — Progress Notes (Signed)
DC home with wife, verbally  Understood DC instructions, no questions ask

## 2013-10-04 NOTE — Care Management Note (Signed)
  Page 1 of 1   10/04/2013     10:04:56 AM   CARE MANAGEMENT NOTE 10/04/2013  Patient:  Alan [name], Alan [name]   Account Number:  [id number]  Date Initiated:  10/04/2013  Documentation initiated by:  Magdalen Spatz  Subjective/Objective Assessment:     Action/Plan:   Anticipated DC Date:  10/04/2013   Anticipated DC Plan:  [place]         Choice offered to / List presented to:  C-1 Patient   DME arranged  Vassie Moselle      DME agency  [place] arranged  [place].   Status of service:  Completed, signed off Medicare Important Message given?   (If response is "NO", the following Medicare IM given date fields will be blank) Date Medicare IM given:   Date Additional Medicare IM given:    Discharge Disposition:    Per UR Regulation:    If discussed at Long Length of Stay Meetings, dates discussed:    Comments:  10-04-13 Confirmed face sheet information with patient and visitor. Magdalen Spatz RN BSN [phone]

## 2013-10-05 ENCOUNTER — Encounter (HOSPITAL_COMMUNITY): Payer: Self-pay | Admitting: Orthopedic Surgery

## 2013-10-26 ENCOUNTER — Ambulatory Visit (HOSPITAL_BASED_OUTPATIENT_CLINIC_OR_DEPARTMENT_OTHER): Payer: Medicare Other | Admitting: Oncology

## 2013-10-26 ENCOUNTER — Telehealth: Payer: Self-pay | Admitting: Oncology

## 2013-10-26 ENCOUNTER — Telehealth: Payer: Self-pay | Admitting: *Deleted

## 2013-10-26 VITALS — BP 107/48 | HR 106 | Resp 19 | Ht 65.0 in | Wt 148.7 lb

## 2013-10-26 DIAGNOSIS — R609 Edema, unspecified: Secondary | ICD-10-CM

## 2013-10-26 DIAGNOSIS — R970 Elevated carcinoembryonic antigen [CEA]: Secondary | ICD-10-CM

## 2013-10-26 DIAGNOSIS — R599 Enlarged lymph nodes, unspecified: Secondary | ICD-10-CM

## 2013-10-26 DIAGNOSIS — C189 Malignant neoplasm of colon, unspecified: Secondary | ICD-10-CM

## 2013-10-26 DIAGNOSIS — C7951 Secondary malignant neoplasm of bone: Secondary | ICD-10-CM

## 2013-10-26 DIAGNOSIS — C7952 Secondary malignant neoplasm of bone marrow: Secondary | ICD-10-CM

## 2013-10-26 DIAGNOSIS — K5900 Constipation, unspecified: Secondary | ICD-10-CM

## 2013-10-26 DIAGNOSIS — C3400 Malignant neoplasm of unspecified main bronchus: Secondary | ICD-10-CM

## 2013-10-26 DIAGNOSIS — D649 Anemia, unspecified: Secondary | ICD-10-CM

## 2013-10-26 NOTE — Telephone Encounter (Signed)
Gave pt appt for MD only for MAy 2015

## 2013-10-26 NOTE — Progress Notes (Signed)
  [facility] OFFICE PROGRESS NOTE   Diagnosis: Non-small cell lung cancer  INTERVAL HISTORY:   Alan [name] returns as scheduled. He was found to have an impending pathologic fracture in the right femur and underwent a prophylactic intramedullary rod placement by Dr. Berenice Primas on 10/03/2013. He reports an uneventful upper coverage. He currently has no pain in the right leg.  He had right greater than left lower leg swelling prior to surgery and this persists. The swelling improves with leg elevation.  The right neck mass continues to decrease in size. He is waiting on new dentures.  Objective:  Vital signs in last 24 hours:  Blood pressure 107/48, pulse 106, resp. rate 19, height $RemoveBe'5\' 5"'AHogoCxpC$  (1.651 m), weight 148 lb 11.2 oz (67.45 kg).    HEENT: Firm mass in the right greater than left lower neck Lymphatics: No supraclavicular or axillary nodes Resp: Distant breath sounds, no respiratory distress Cardio: Regular rate and rhythm GI: No hepatosplenomegaly Vascular: 1-2+ pitting edema at the right greater than left lower leg  Skin: Healed surgical incisions at the right thigh    Medications: I have reviewed the patient's current medications.  Assessment/Plan: 1.Non-small cell lung cancer-obstructing left endobronchial lesion with chest lymphadenopathy, status post a bronchoscopy/EBUS 11/20/2012 with the pathology confirming adenocarcinoma of lung primary , ALK negative , EGFR mutation negative  -Staging PET scan 12/05/2012 consistent with metastatic neck/chest adenopathy, retroperitoneal adenopathy, and bone metastases  -Status post palliative radiation to the left chest completed 12/15/2012  2. Respiratory failure following the bronchoscopy procedure 11/20/2012, now maintained on supplemental oxygen and bronchodilator therapy -admitted with acute respiratory failure 12/26/2012-improved following a steroid taper  3. Stage II colon cancer April 2014  4. Abdominal aortic  aneurysm-planned percutaneous graft repair has been placed on hold  5. Diabetes  6. Anemia  7. Markedly elevated CEA-most likely secondary to the non-small cell lung cancer  8. Pain/mass at the right mandible-status post palliative radiation completed 03/15/2013 , improved  9. Oral Mucositis secondary to radiation -resolved  10. Progressive bilateral neck lymphadenopathy with associated pain, status post palliative radiation completed 07/06/2013, improved  11. Constipation-improved with MiraLAX  12. Destructive right femur lesion on plain x-ray 09/28/2013-previous palliative radiation to the right femur in 2014, status post prophylactic intramedullary rod placement 10/04/2013 13. Bilateral leg edema-likely related to malnutrition, COPD, and anemia. I have a low clinical suspicion for a deep vein thrombosis. He will contact us for increased leg swelling or the development of pain/erythema.   Disposition:  Alan [name] appears well. He has recovered uneventfully from the right femur surgery. He will continue monthly followup at the [facility]. We will ask Dr. Lisbeth Renshaw to consider the indication for additional radiation to the right femur. Alan [name] will schedule an appointment with Dr. Lisbeth Renshaw .  [name], MD  10/26/2013  1:04 PM

## 2013-10-26 NOTE — Telephone Encounter (Signed)
Gave pt appt for MD on May 2015

## 2013-10-26 NOTE — Telephone Encounter (Signed)
Left message at home number requesting pt call office. Per Dr. Benay Spice: Discontinue Lisinopril.

## 2013-11-02 IMAGING — CR DG CHEST 2V
2 series · 2 of 2 positions shown · non-contrast
Comparison: None.

CLINICAL DATA: Cough.

CHEST - 2 VIEW

[view not recorded (1 of 2)]
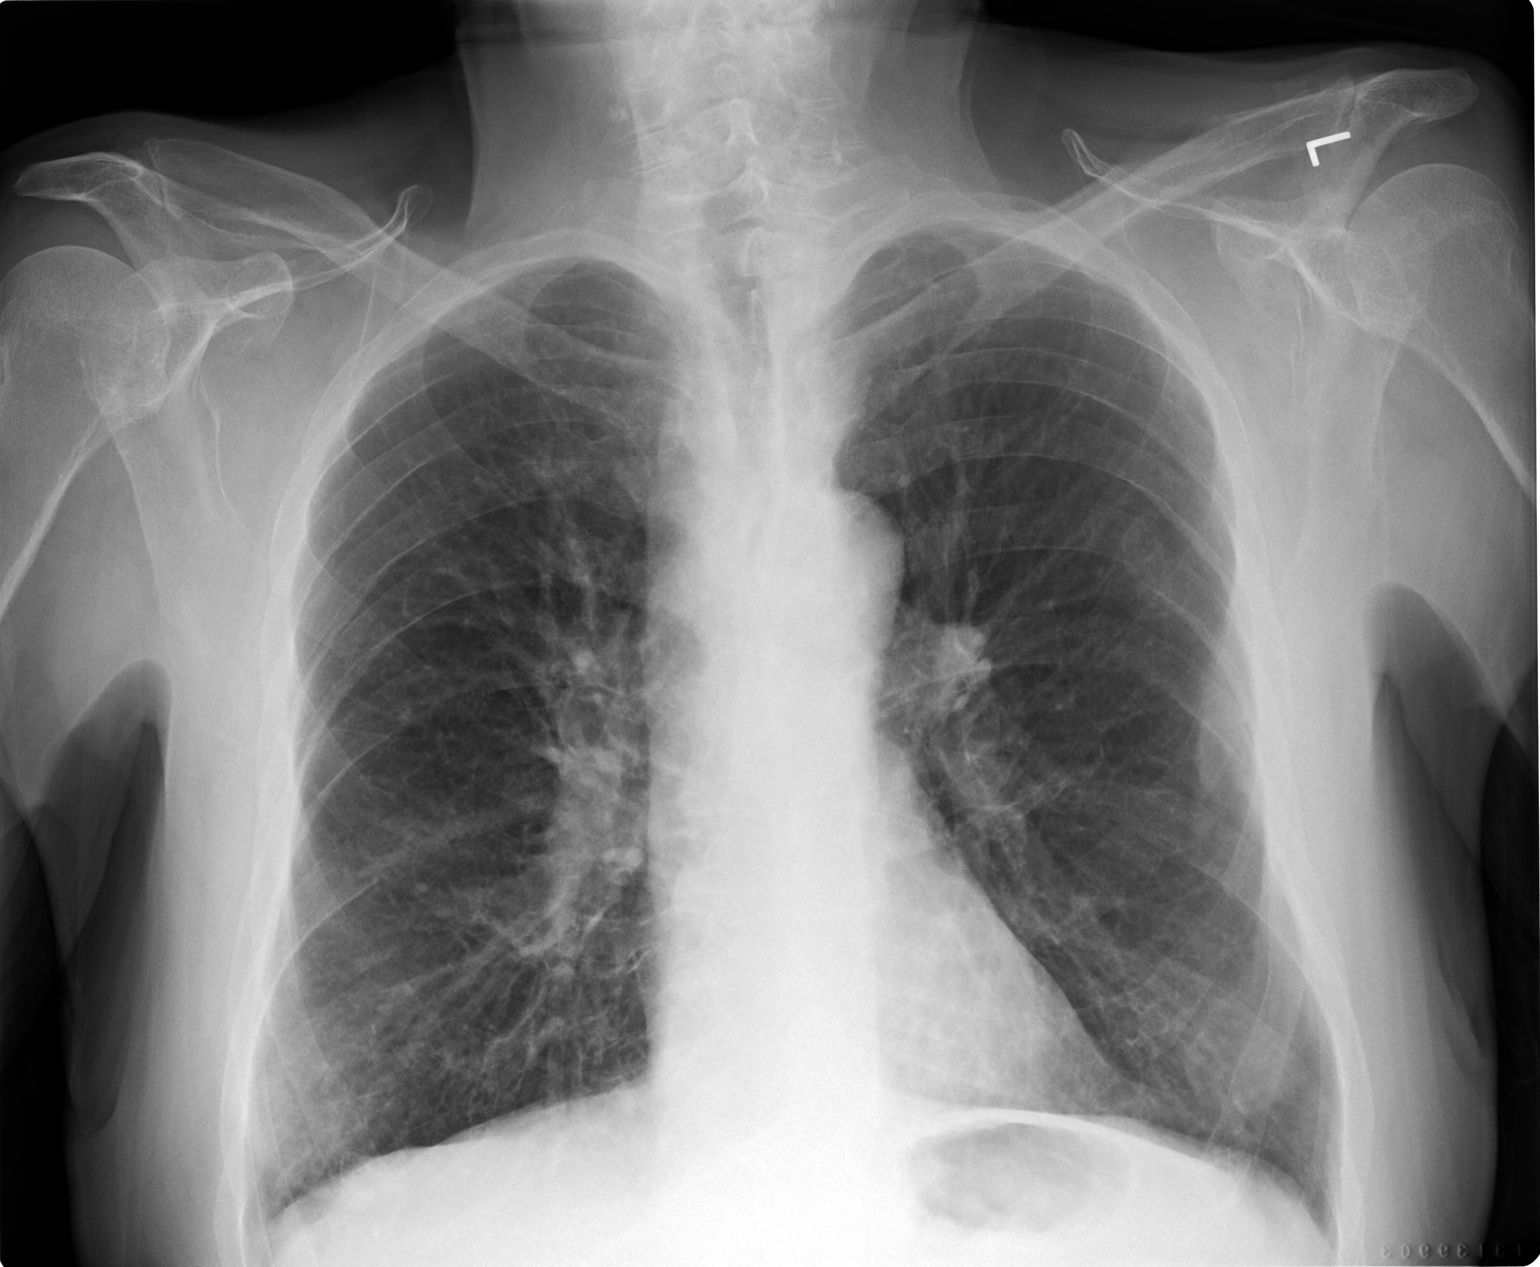

[view not recorded (2 of 2)]
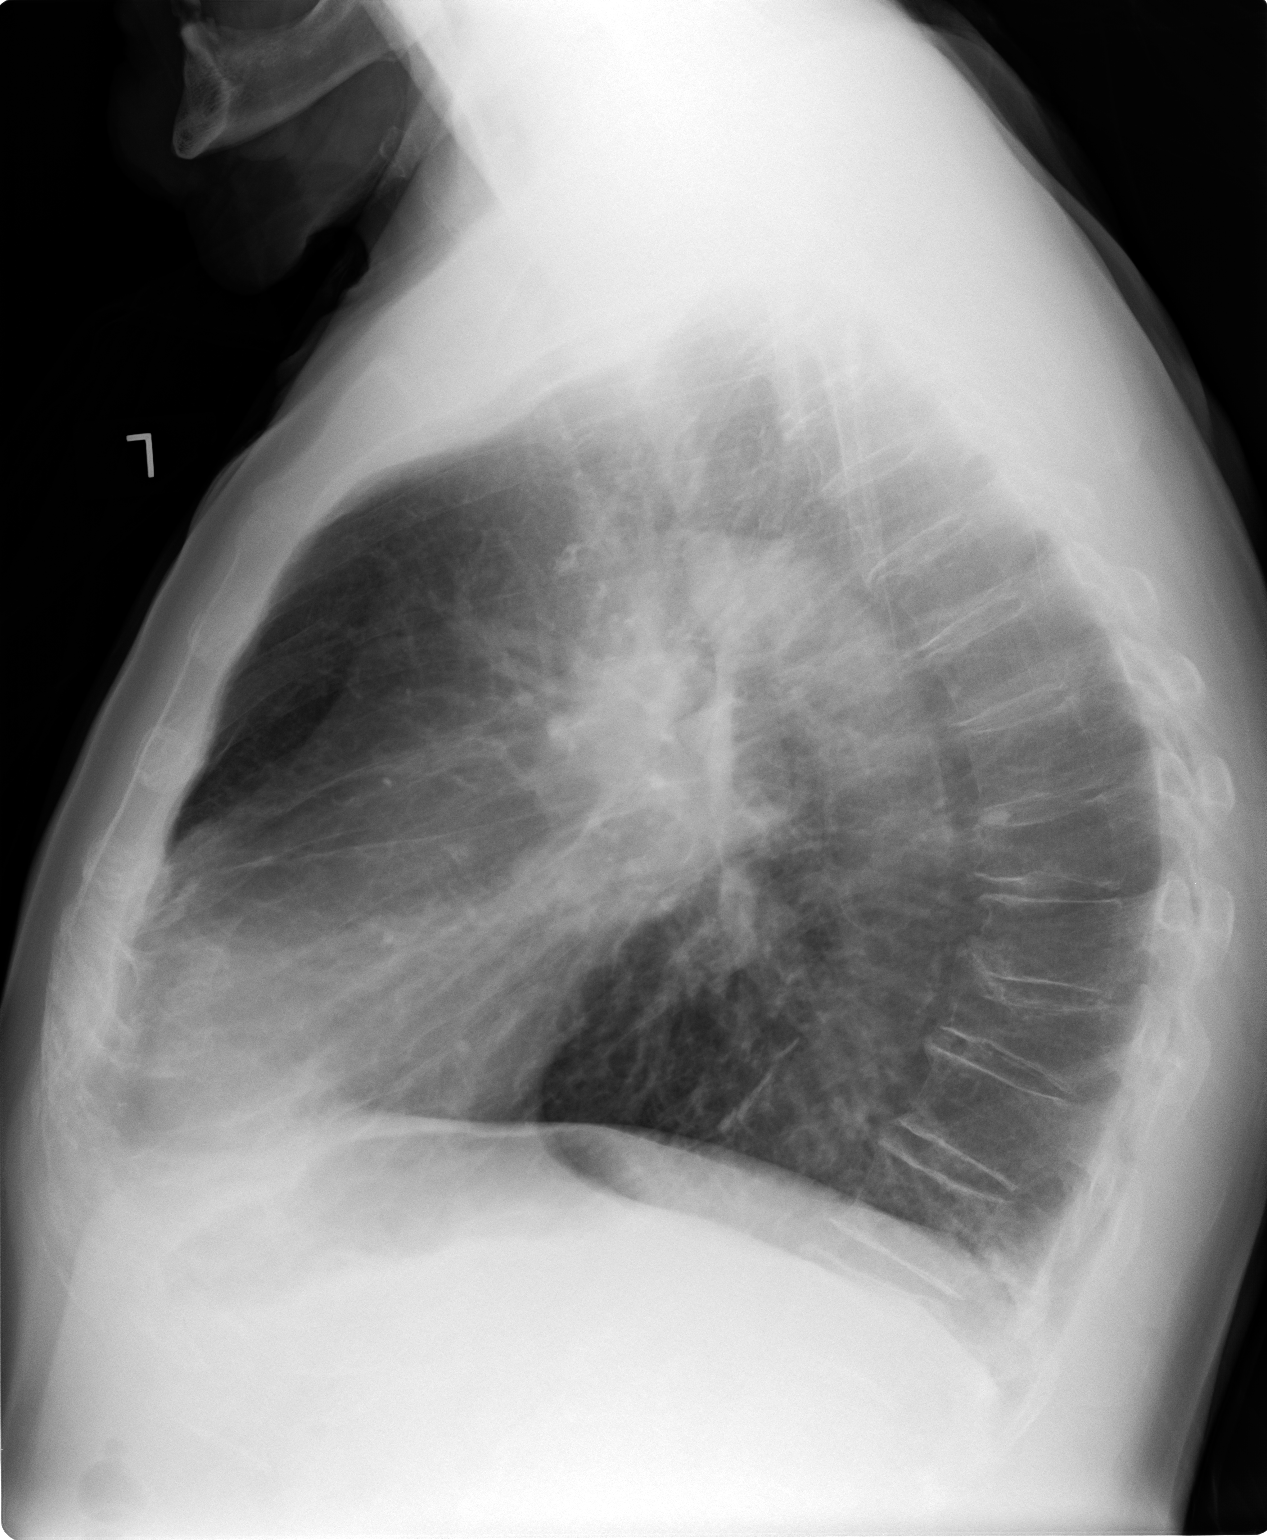

[2 of 2 positions shown; findings below may reference images not displayed]

FINDINGS: Heart size is normal.  Both lungs are clear. Mild
pulmonary hyperinflation is suspicious for COPD.  No evidence of
pleural effusion.  No mass or lymphadenopathy identified.  Old left
rib fracture deformities are noted as well as several lower
thoracic vertebral body compression deformities.
IMPRESSION: 1.  No active disease.
2.  Probable COPD.
3.  Old left rib fracture and lower thoracic vertebral compression
deformities.

## 2013-11-02 IMAGING — CR DG SINUSES 1-2V
1 series · 1 of 1 positions shown · non-contrast
Comparison: None

CLINICAL DATA: Congestion.

PARANASAL SINUSES - 1-2 VIEW

[view not recorded]
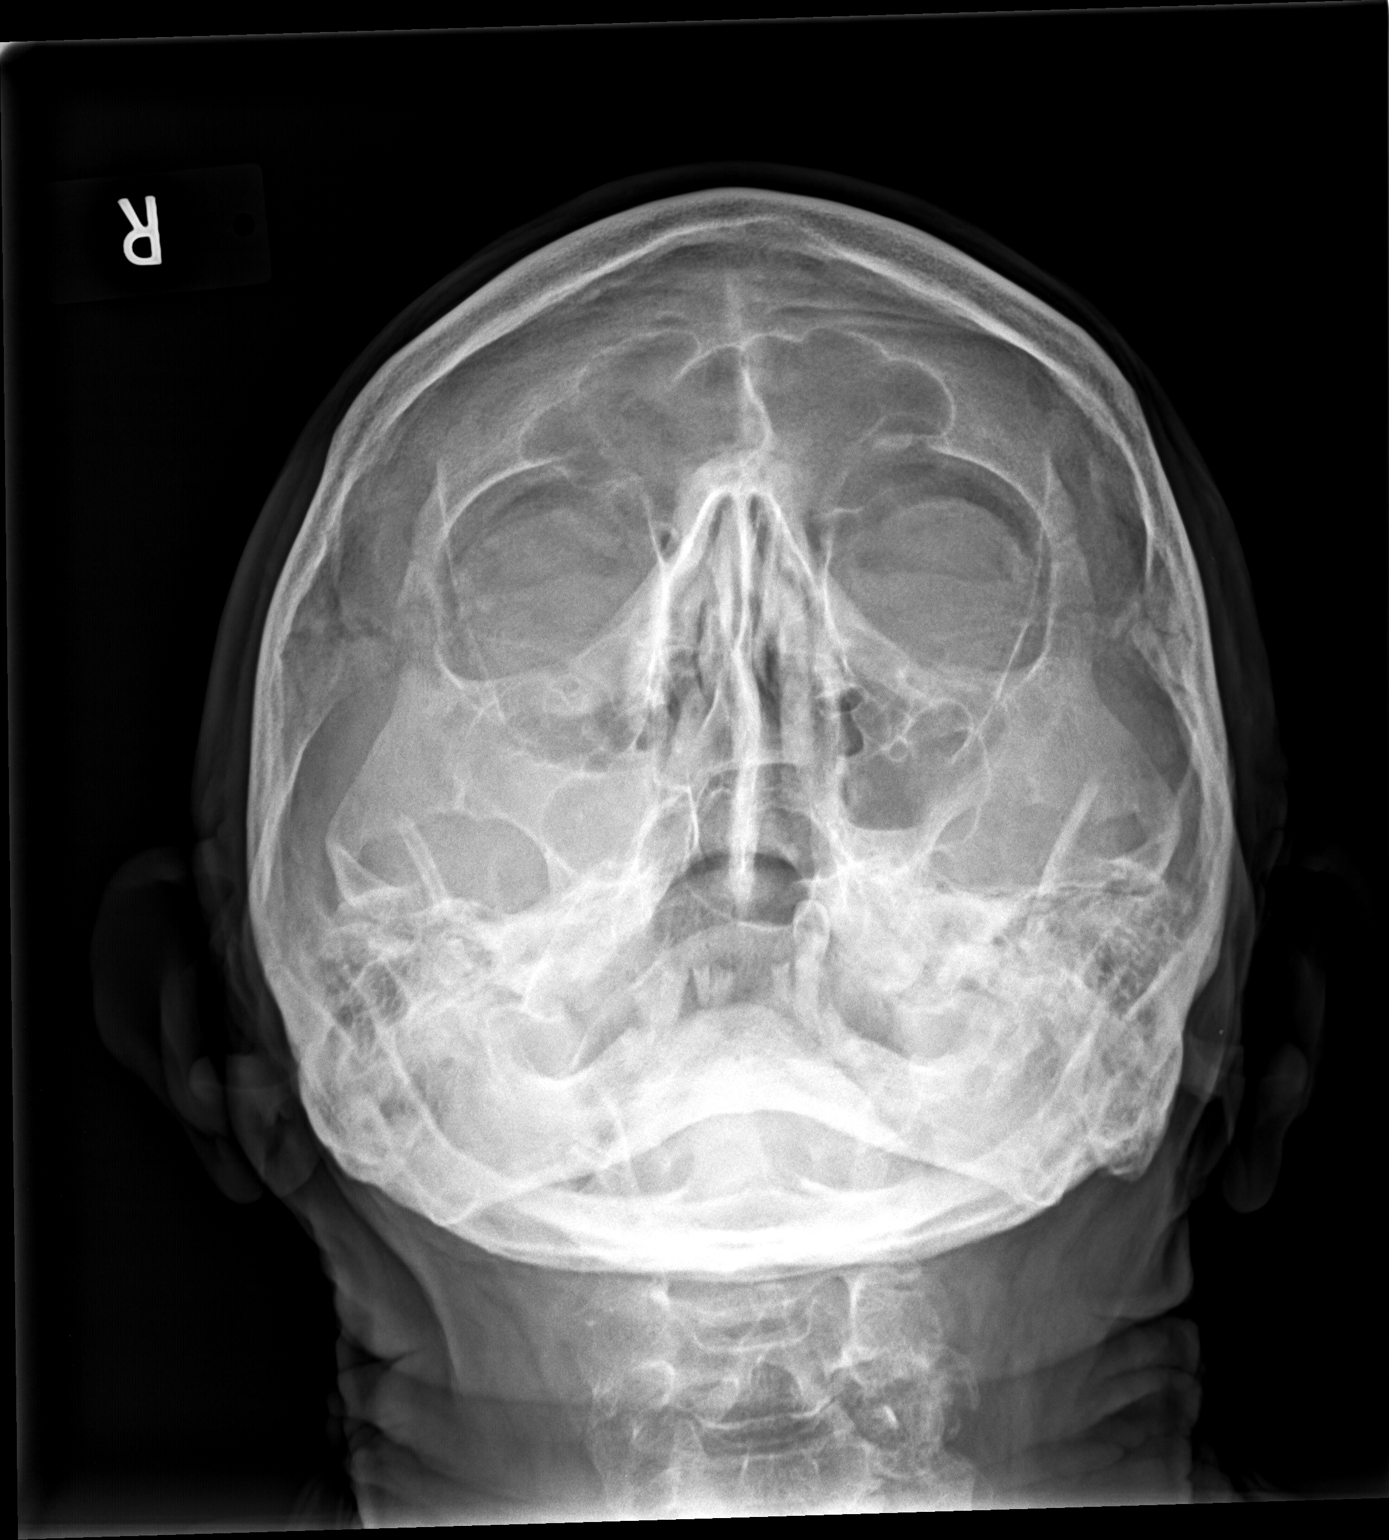

[1 of 1 positions shown; findings below may reference images not displayed]

FINDINGS: Air-fluid levels noted in the maxillary sinuses
bilaterally, larger on the right.  I suspect there is underlying
mucosal thickening as well.  Frontal sinuses are clear.  No acute
bony abnormality.
IMPRESSION: Acute sinusitis changes in the maxillary sinuses, likely
superimposed on chronic sinusitis.

## 2013-11-16 ENCOUNTER — Telehealth: Payer: Self-pay | Admitting: Dietician

## 2013-11-16 NOTE — Telephone Encounter (Signed)
Brief Outpatient Oncology Nutrition Note  Patient has been identified to be at risk on malnutrition screen.  Wt Readings from Last 10 Encounters:  10/26/13 148 lb 11.2 oz (67.45 kg)  10/03/13 152 lb (68.947 kg)  10/03/13 152 lb (68.947 kg)  09/28/13 152 lb 3.2 oz (69.037 kg)  08/17/13 152 lb 11.2 oz (69.264 kg)  08/17/13 153 lb 9.6 oz (69.673 kg)  07/20/13 156 lb 14.4 oz (71.169 kg)  07/06/13 161 lb (73.029 kg)  06/29/13 157 lb 12.8 oz (71.578 kg)  06/22/13 159 lb 11.2 oz (72.439 kg)      Dx:  Non-small cell lung cancer.  Patient of Dr. Benay Spice.  Called patient due to weight loss.  Spoke with wife who reports patient has a poor appetite and early satiety.  Will only eat 5-6 bites and complains of fullness.  Is drinking Glucerna at times.  Discussed increasing Glucerna or Boost Glucose control to 2-3 per day.  Gave tips to increase calories and protein and discussed increased protein needs.  Will mail coupons, tips to increase calories and protein and contact information for the [facility].  Antonieta Iba, RD, LDN

## 2013-11-22 NOTE — Progress Notes (Signed)
Histology and Location of Primary Cancer: lung  Sites of Visceral and Bony Metastatic Disease: right femur  Location(s) of Symptomatic Metastases: right femur  Past/Anticipated chemotherapy by medical oncology, if any:   Pain on a scale of 0-10 is:     If Spine Met(s), symptoms, if any, include:  Bowel/Bladder retention or incontinence (please describe):   Numbness or weakness in extremities (please describe):   Current Decadron regimen, if applicable:   Ambulatory status? Walker? Wheelchair?:   SAFETY ISSUES:  Prior radiation? YES, 02/2013-03/2013 right mandibular region; 06/2013 bilateral neck.  Radiation in [place] to right femur- called for notes on 11/22/13  Pacemaker/ICD? no  Possible current pregnancy? na  Is the patient on methotrexate? no  Current Complaints / other details: He was found to have an impending pathologic fracture in the right femur and underwent a prophylactic intramedullary rod placement by Dr. Berenice Primas on 10/03/2013. Dr Lisbeth Renshaw to consider indication for additional radiation to right femur.

## 2013-11-23 ENCOUNTER — Ambulatory Visit (HOSPITAL_BASED_OUTPATIENT_CLINIC_OR_DEPARTMENT_OTHER): Payer: Medicare Other | Admitting: Oncology

## 2013-11-23 ENCOUNTER — Other Ambulatory Visit: Payer: Self-pay | Admitting: *Deleted

## 2013-11-23 ENCOUNTER — Ambulatory Visit: Payer: Medicare Other

## 2013-11-23 ENCOUNTER — Ambulatory Visit
Admission: RE | Admit: 2013-11-23 | Discharge: 2013-11-23 | Disposition: A | Discharge status: Home / Self Care | Payer: Medicare Other | Source: Ambulatory Visit | Attending: Radiation Oncology | Admitting: Radiation Oncology

## 2013-11-23 ENCOUNTER — Telehealth: Payer: Self-pay | Admitting: Oncology

## 2013-11-23 VITALS — BP 104/47 | HR 115 | Temp 97.9°F | Resp 17 | Ht 66.0 in | Wt 141.9 lb

## 2013-11-23 DIAGNOSIS — C7951 Secondary malignant neoplasm of bone: Secondary | ICD-10-CM

## 2013-11-23 DIAGNOSIS — C3400 Malignant neoplasm of unspecified main bronchus: Secondary | ICD-10-CM

## 2013-11-23 DIAGNOSIS — E119 Type 2 diabetes mellitus without complications: Secondary | ICD-10-CM

## 2013-11-23 DIAGNOSIS — C182 Malignant neoplasm of ascending colon: Secondary | ICD-10-CM

## 2013-11-23 DIAGNOSIS — C7952 Secondary malignant neoplasm of bone marrow: Secondary | ICD-10-CM

## 2013-11-23 DIAGNOSIS — K5900 Constipation, unspecified: Secondary | ICD-10-CM

## 2013-11-23 DIAGNOSIS — D649 Anemia, unspecified: Secondary | ICD-10-CM

## 2013-11-23 DIAGNOSIS — C189 Malignant neoplasm of colon, unspecified: Secondary | ICD-10-CM

## 2013-11-23 MED ORDER — SORBITOL 70 % SOLN: Status: AC

## 2013-11-23 NOTE — Telephone Encounter (Signed)
gv adn prnted aptp sched and avs for pt for [employer]

## 2013-11-23 NOTE — Progress Notes (Signed)
  Alan [name] OFFICE PROGRESS NOTE   Diagnosis: Non-small cell lung cancer  INTERVAL HISTORY:   He returns as scheduled. Alan [name] reports irregular bowel habits. He has constipation for several days followed by a bowel movement. The stool is soft. He reports abdominal fullness and early satiety when he is constipated. MiraLAX caused nausea. He also complains of increased low back pain. No pain today. No right leg pain. His family reports that his appetite has been poor.  Objective:  Vital signs in last 24 hours:  Blood pressure 104/47, pulse 115, temperature 97.9 F (36.6 C), temperature source Oral, resp. rate 17, height _0  (1.676 m), weight 141 lb 14.4 oz (64.365 kg), SpO2 94.00%.    HEENT: Firm masses at the right and left lower neck-improved Lymphatics: No supraclavicular or axillary nodes Resp: Decreased breath sounds at the bases, no inspiratory distress Cardio: Regular rate and rhythm GI: No hepatosplenomegaly, nontender, soft Vascular: Trace to 1+ pitting edema at the right greater than left lower leg and ankle Neuro: The leg and foot strength appears intact bilaterally   Imaging:  No results found.  Medications: I have reviewed the patient's current medications.  Assessment/Plan: 1.Non-small cell lung cancer-obstructing left endobronchial lesion with chest lymphadenopathy, status post a bronchoscopy/EBUS 11/20/2012 with the pathology confirming adenocarcinoma of lung primary , ALK negative , EGFR mutation negative  -Staging PET scan 12/05/2012 consistent with metastatic neck/chest adenopathy, retroperitoneal adenopathy, and bone metastases  -Status post palliative radiation to the left chest completed 12/15/2012  2. Respiratory failure following the bronchoscopy procedure 11/20/2012, now maintained on supplemental oxygen and bronchodilator therapy -admitted with acute respiratory failure 12/26/2012-improved following a steroid taper  3. Stage II  colon cancer April 2014  4. Abdominal aortic aneurysm-planned percutaneous graft repair has been placed on hold  5. Diabetes  6. Anemia  7. Markedly elevated CEA-most likely secondary to the non-small cell lung cancer  8. Pain/mass at the right mandible-status post palliative radiation completed 03/15/2013 , improved  9. Oral Mucositis secondary to radiation -resolved  10. Progressive bilateral neck lymphadenopathy with associated pain, status post palliative radiation completed 07/06/2013, improved  11. Constipation-likely secondary to inability and narcotics. 12. Destructive right femur lesion on plain x-ray 09/28/2013-previous palliative radiation to the right femur in 2014, status post prophylactic intramedullary rod placement 10/04/2013  13. Bilateral leg edema-likely related to malnutrition, COPD, and anemia. I have a low clinical suspicion for a deep vein thrombosis.   Disposition:  Alan [name] has a declining performance status. I suspect the metastatic non-small cell lung cancer is progressing. I recommended  reenrollment in the [facility] program. He declines.  The constipation is most likely secondary to inability and narcotics, but he could have an abdominal process or nerve compromise related to metastatic carcinoma. He will discontinue MiraLAX and try sorbitol.  Alan [name] will discontinue lisinopril.  He will return for an office visit in 4 weeks. Alan [name] or his family will contact us in the interim for new symptoms.  [name], MD  11/23/2013  12:20 PM

## 2013-11-23 NOTE — Addendum Note (Signed)
Addended by: Domenic Schwab on: 11/23/2013 03:21 PM   Modules accepted: Orders, Medications

## 2013-11-29 IMAGING — CR DG CHEST 1V PORT
1 series · 1 of 1 positions shown · non-contrast
Comparison: 11/20/2012

CLINICAL DATA: Respiratory failure

PORTABLE CHEST - 1 VIEW

[AP]
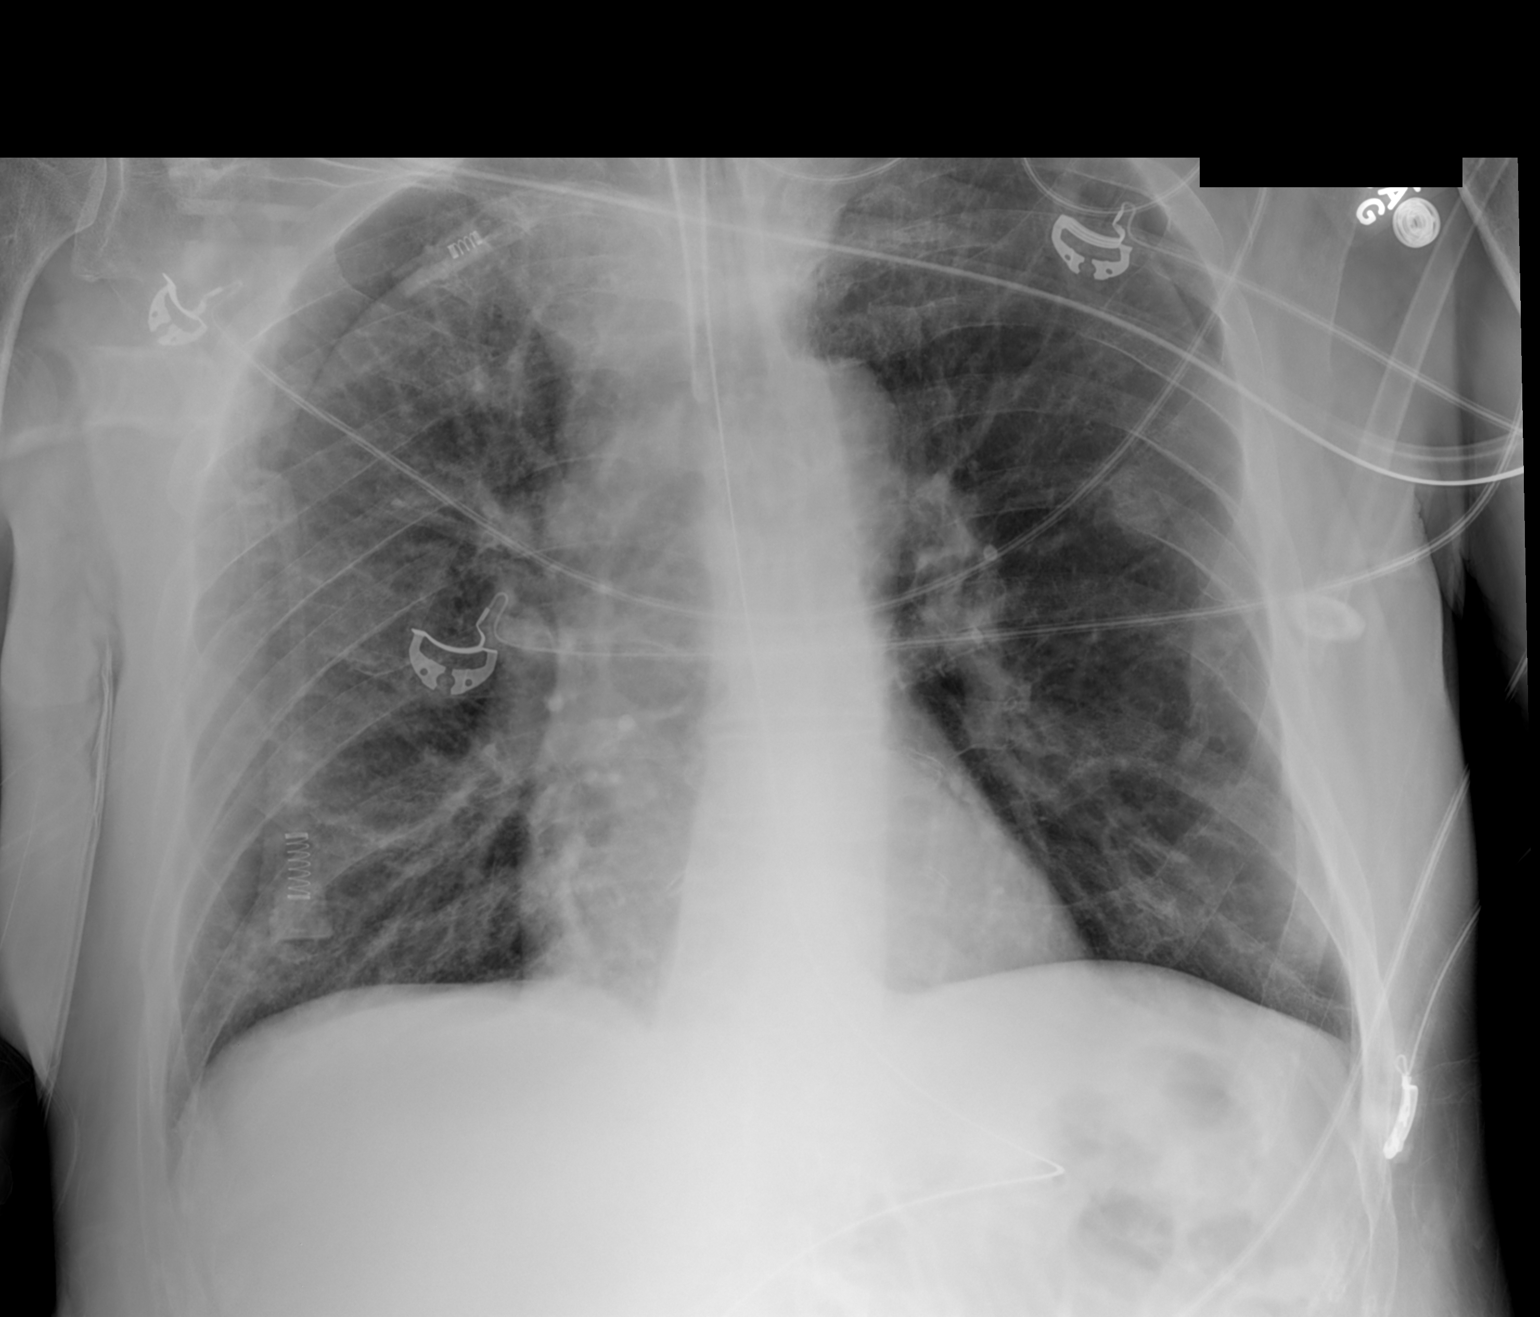

[1 of 1 positions shown; findings below may reference images not displayed]

FINDINGS: Endotracheal tube 2.7 cm above the carina.  NG tube
enters the stomach with the tip not visualized.  Monitor leads
overlie the chest.  Skin folds overlie the upper lobes.  Slight
improvement in lung volumes with resolving mild interstitial edema
pattern.  No effusion or pneumothorax.  No new collapse or
consolidation.  Stable chronic bronchitic and interstitial pattern.
IMPRESSION: Improving mild interstitial edema pattern.  Stable support
apparatus.

## 2013-11-30 ENCOUNTER — Telehealth: Payer: Self-pay | Admitting: Family Medicine

## 2013-11-30 NOTE — Telephone Encounter (Signed)
Patient aware samples up front  

## 2013-12-07 ENCOUNTER — Telehealth: Payer: Self-pay | Admitting: *Deleted

## 2013-12-07 DIAGNOSIS — C3400 Malignant neoplasm of unspecified main bronchus: Secondary | ICD-10-CM

## 2013-12-07 NOTE — Telephone Encounter (Signed)
Call from pt's daughter in law reporting pt's wife is requesting referral back to hospice. Reviewed with Dr. Benay Spice: Order received, faxed to [facility].

## 2013-12-20 ENCOUNTER — Ambulatory Visit: Payer: Medicare Other | Admitting: Oncology

## 2014-01-04 IMAGING — CT CT ANGIO CHEST
2 of 6 series · 18 of 36 positions shown · IV contrast (omnipaque)
Comparison: PET CT 12/05/2012

CLINICAL DATA: Short of breath

CT ANGIOGRAPHY CHEST
TECHNIQUE: Multidetector CT imaging of the chest using the
standard protocol during bolus administration of intravenous
contrast. Multiplanar reconstructed images including MIPs were
obtained and reviewed to evaluate the vascular anatomy.
Contrast: 100mL OMNIPAQUE IOHEXOL 350 MG/ML SOLN

[Series 6: pe thins @ 1mm · axial · 0.73mm/px · z∈[-276,-11]mm · 17 of 295 slices shown]
[im 15/295  lung]
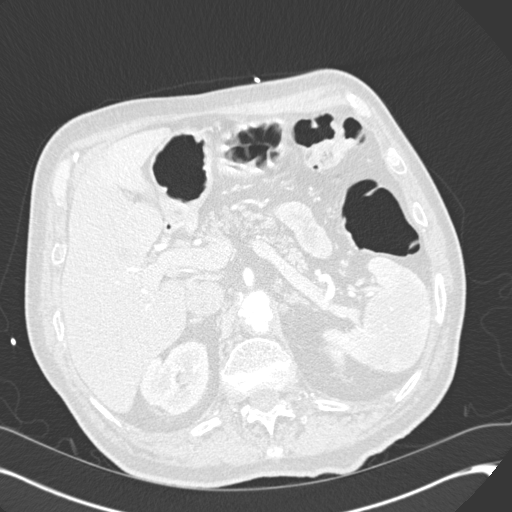
[im 30/295  mediastinal]
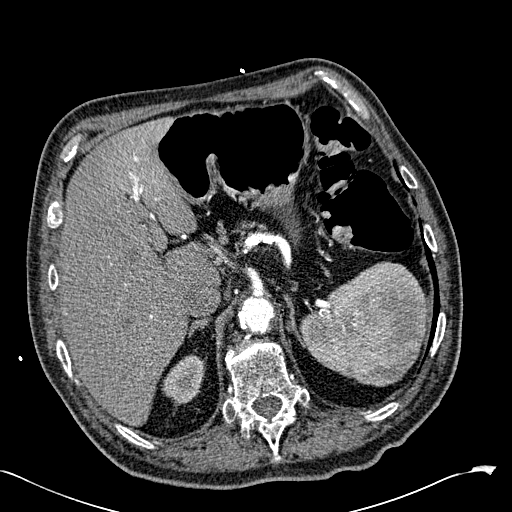
[im 45/295  lung]
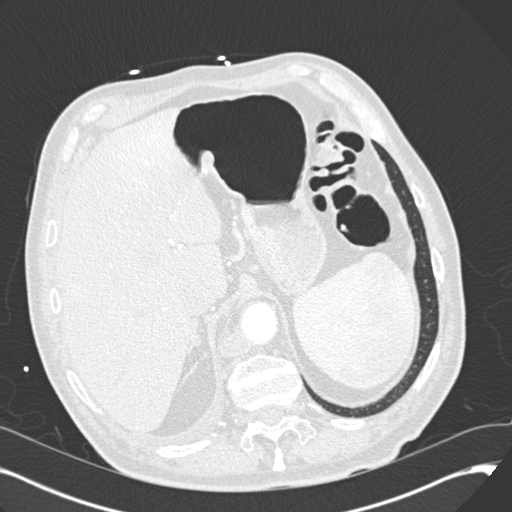
[im 59/295  mediastinal]
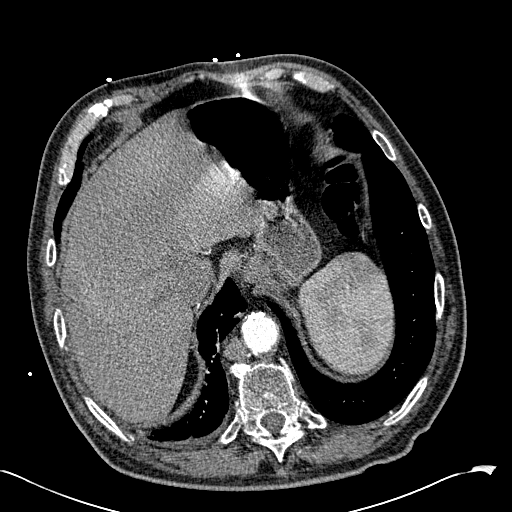
[im 89/295  lung]
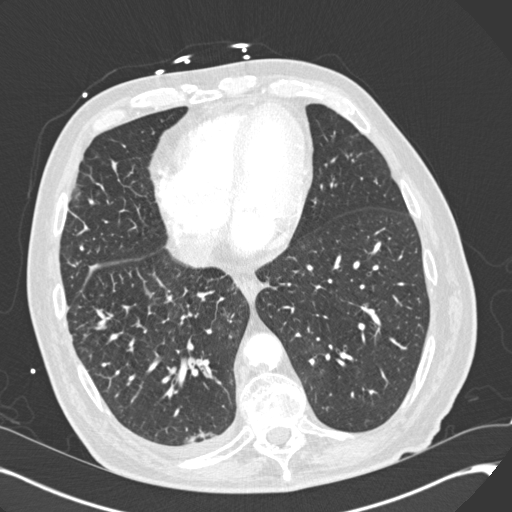
[im 103/295  mediastinal]
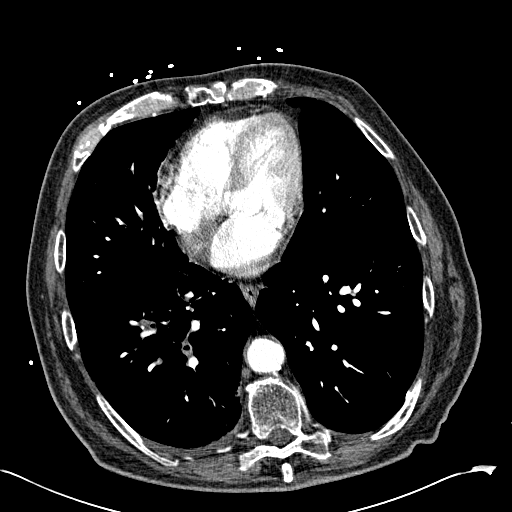
[im 118/295  lung]
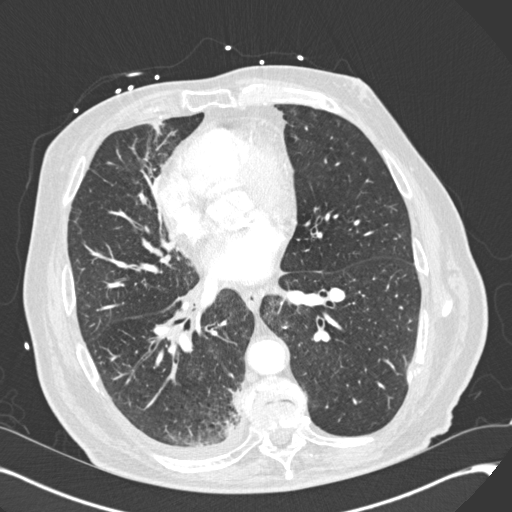
[im 133/295  mediastinal]
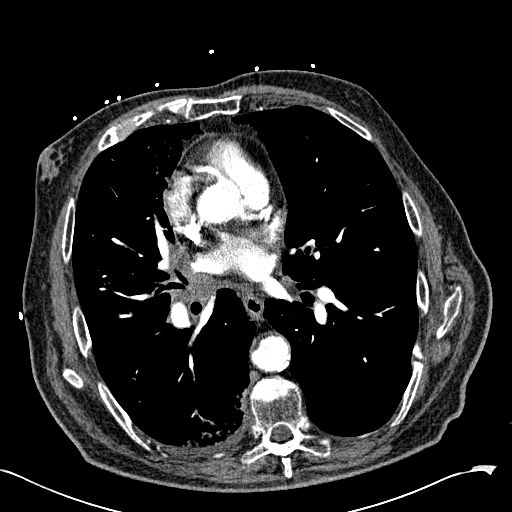
[im 148/295  lung]
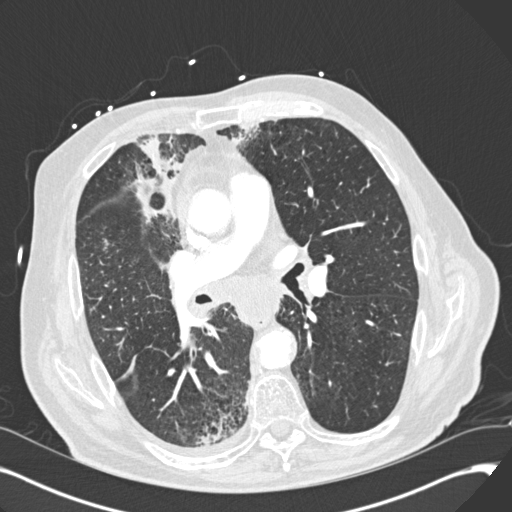
[im 162/295  mediastinal]
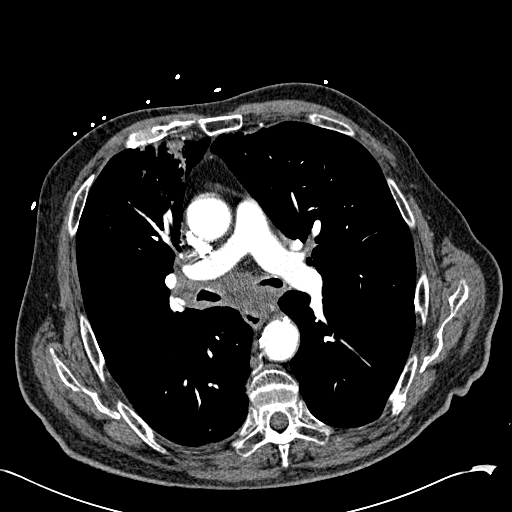
[im 177/295  lung]
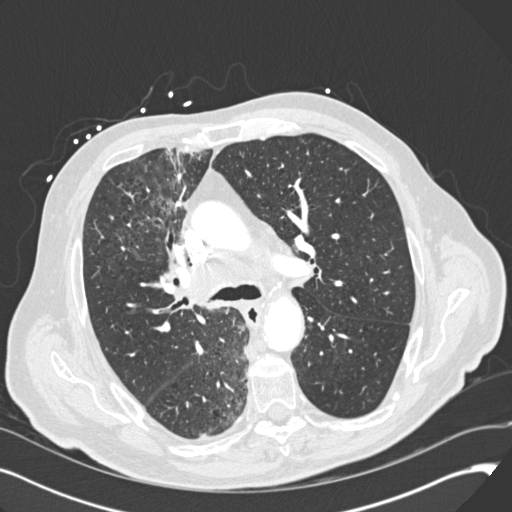
[im 192/295  mediastinal]
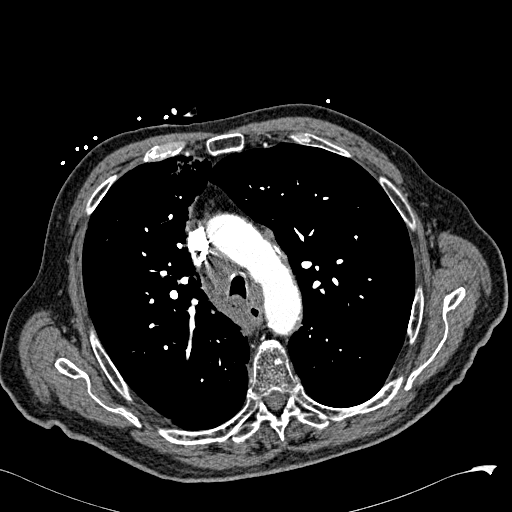
[im 206/295  lung]
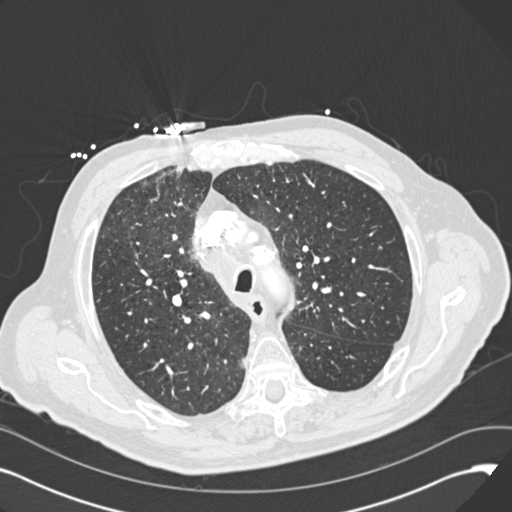
[im 236/295  mediastinal]
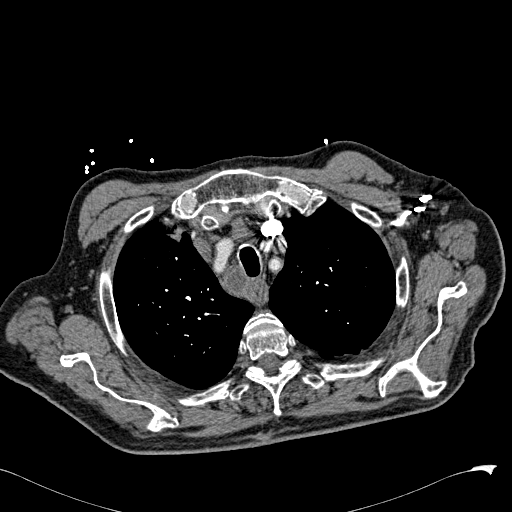
[im 250/295  lung]
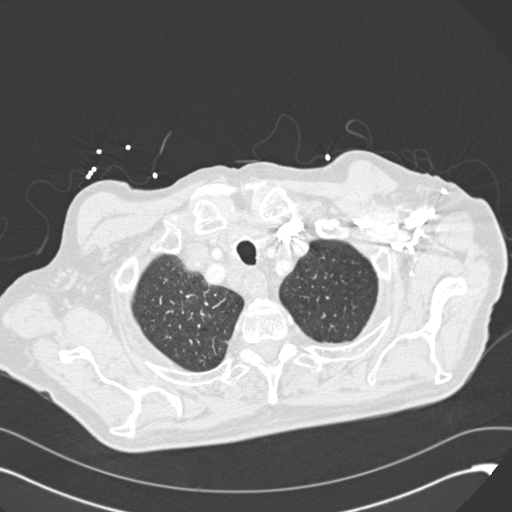
[im 265/295  mediastinal]
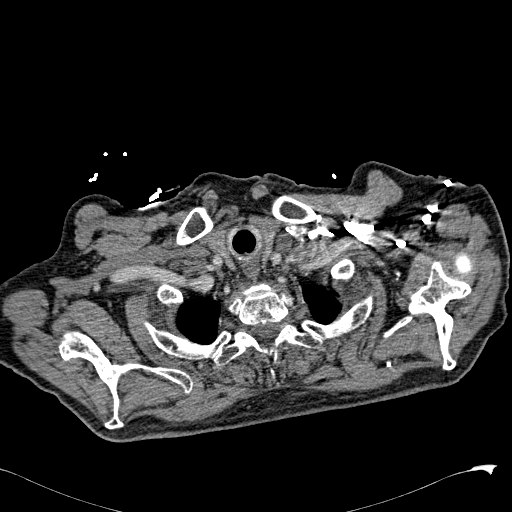
[im 280/295  lung]
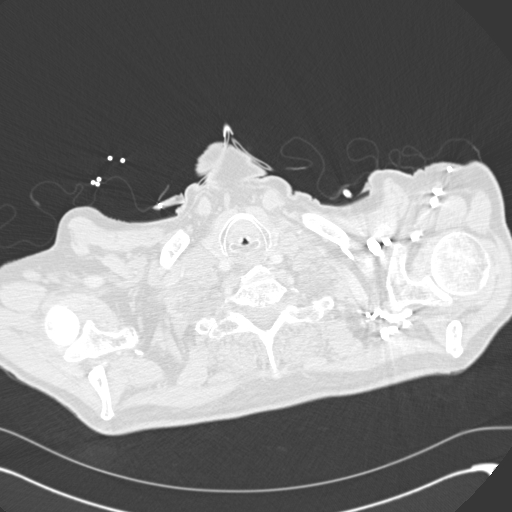

[Series 602: <mpr thick range> · coronal · 0.73mm/px · 1 of 161 slices shown]
[im 81/161  mediastinal]
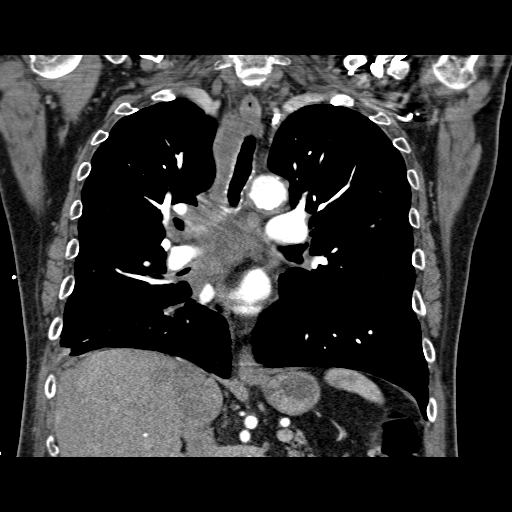

[18 of 36 positions shown; findings below may reference images not displayed]

FINDINGS: There are no filling defects in the pulmonary arterial
tree to suggest acute pulmonary thromboembolism.

Abnormal mediastinal adenopathy and mass effect is stable compared
with the recent PET CT.  Abnormal adenopathy in the lower neck is
not significantly changed. A subcarinal mass exerts mass effect
upon the esophagus. Right retrocrural adenopathy is stable.

Diffuse atherosclerotic changes of the aorta with irregular plaque
along the descending aorta is noted.  There is plaque at the origin
of the innominate and left subclavian arteries.  No evidence of
dissection, transection, or aortic aneurysm.

Small right pleural effusion.  No pericardial effusion.

8 mm right upper lobe nodule on image 39 is larger.  7 mm right
middle lobe nodule on image 67 and 6 mm right middle lobe nodule on
image 68 are subjectively larger.  Abnormal confluent pulmonary
parenchymal opacities within the right lung have developed
associated with irregular reticular markings.  For example in the
anterior right upper lobe, there are several confluent opacities.
1.4 x 2.0 cm wedge shaped opacity on image 48.  Peripheral basilar
confluent opacity at the right lung base on image 74.
Predominately irregular reticular opacities in the posterior right
lower lobe extending from the mid to superior segment.

Chronic left rib deformities with nonunion.  Stable appearance of
the thoracic spine.
IMPRESSION: No evidence of acute pulmonary thromboembolism.

There are confluent pulmonary parenchymal opacities associated with
interstitial lung disease primarily in the right lung.  These
findings may reflect radiation pneumonitis however metastatic
disease is not excluded.  Small nodules are subjectively larger.

Small right pleural effusion.  This can be associated with
radiation pneumonitis.

## 2014-01-09 DEATH — deceased

## 2014-10-10 ENCOUNTER — Telehealth: Payer: Self-pay

## 2014-10-10 NOTE — Telephone Encounter (Signed)
10/10/14 Disc received from [facility] and filed on shelf.Britt Bottom
